# Patient Record
Sex: Female | Born: 1965
Health system: Southern US, Community
[De-identification: ages and names within clinical notes are randomized; demographics above are authoritative.]

## PROBLEM LIST (undated history)

## (undated) DIAGNOSIS — I10 Essential (primary) hypertension: Secondary | ICD-10-CM

## (undated) DIAGNOSIS — F419 Anxiety disorder, unspecified: Secondary | ICD-10-CM

## (undated) DIAGNOSIS — M1990 Unspecified osteoarthritis, unspecified site: Secondary | ICD-10-CM

## (undated) DIAGNOSIS — F329 Major depressive disorder, single episode, unspecified: Secondary | ICD-10-CM

## (undated) DIAGNOSIS — F32A Depression, unspecified: Secondary | ICD-10-CM

## (undated) HISTORY — PX: KNEE ARTHROSCOPY: SUR90

## (undated) HISTORY — PX: ABDOMINAL HYSTERECTOMY: SHX81

---
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
## 2003-03-16 ENCOUNTER — Encounter: Payer: Self-pay | Admitting: Internal Medicine

## 2003-03-16 ENCOUNTER — Ambulatory Visit (HOSPITAL_COMMUNITY): Admission: RE | Admit: 2003-03-16 | Discharge: 2003-03-16 | Payer: Self-pay | Admitting: Internal Medicine

## 2003-03-23 ENCOUNTER — Other Ambulatory Visit: Admission: RE | Admit: 2003-03-23 | Discharge: 2003-03-23 | Payer: Self-pay | Admitting: Obstetrics and Gynecology

## 2003-06-02 ENCOUNTER — Inpatient Hospital Stay (HOSPITAL_COMMUNITY): Admission: RE | Admit: 2003-06-02 | Discharge: 2003-06-03 | Payer: Self-pay | Admitting: Obstetrics and Gynecology

## 2003-08-27 ENCOUNTER — Emergency Department (HOSPITAL_COMMUNITY): Admission: EM | Admit: 2003-08-27 | Discharge: 2003-08-27 | Payer: Self-pay

## 2004-09-26 ENCOUNTER — Ambulatory Visit: Payer: Self-pay | Admitting: Internal Medicine

## 2016-11-28 DIAGNOSIS — F330 Major depressive disorder, recurrent, mild: Secondary | ICD-10-CM | POA: Diagnosis not present

## 2016-12-19 DIAGNOSIS — F330 Major depressive disorder, recurrent, mild: Secondary | ICD-10-CM | POA: Diagnosis not present

## 2017-01-27 DIAGNOSIS — F330 Major depressive disorder, recurrent, mild: Secondary | ICD-10-CM | POA: Diagnosis not present

## 2017-02-13 DIAGNOSIS — F330 Major depressive disorder, recurrent, mild: Secondary | ICD-10-CM | POA: Diagnosis not present

## 2017-03-26 DIAGNOSIS — B9789 Other viral agents as the cause of diseases classified elsewhere: Secondary | ICD-10-CM | POA: Diagnosis not present

## 2017-03-26 DIAGNOSIS — J069 Acute upper respiratory infection, unspecified: Secondary | ICD-10-CM | POA: Diagnosis not present

## 2017-03-26 DIAGNOSIS — J209 Acute bronchitis, unspecified: Secondary | ICD-10-CM | POA: Diagnosis not present

## 2017-03-29 DIAGNOSIS — H6123 Impacted cerumen, bilateral: Secondary | ICD-10-CM | POA: Diagnosis not present

## 2017-03-29 DIAGNOSIS — J988 Other specified respiratory disorders: Secondary | ICD-10-CM | POA: Diagnosis not present

## 2017-03-29 DIAGNOSIS — R062 Wheezing: Secondary | ICD-10-CM | POA: Diagnosis not present

## 2017-04-09 DIAGNOSIS — F330 Major depressive disorder, recurrent, mild: Secondary | ICD-10-CM | POA: Diagnosis not present

## 2017-07-02 DIAGNOSIS — F330 Major depressive disorder, recurrent, mild: Secondary | ICD-10-CM | POA: Diagnosis not present

## 2017-10-21 DIAGNOSIS — F4312 Post-traumatic stress disorder, chronic: Secondary | ICD-10-CM | POA: Diagnosis not present

## 2017-10-21 DIAGNOSIS — F411 Generalized anxiety disorder: Secondary | ICD-10-CM | POA: Diagnosis not present

## 2017-10-21 DIAGNOSIS — F429 Obsessive-compulsive disorder, unspecified: Secondary | ICD-10-CM | POA: Diagnosis not present

## 2017-10-21 DIAGNOSIS — F331 Major depressive disorder, recurrent, moderate: Secondary | ICD-10-CM | POA: Diagnosis not present

## 2017-11-24 DIAGNOSIS — F411 Generalized anxiety disorder: Secondary | ICD-10-CM | POA: Diagnosis not present

## 2017-11-24 DIAGNOSIS — F4312 Post-traumatic stress disorder, chronic: Secondary | ICD-10-CM | POA: Diagnosis not present

## 2017-11-24 DIAGNOSIS — F429 Obsessive-compulsive disorder, unspecified: Secondary | ICD-10-CM | POA: Diagnosis not present

## 2017-11-24 DIAGNOSIS — F330 Major depressive disorder, recurrent, mild: Secondary | ICD-10-CM | POA: Diagnosis not present

## 2017-12-26 DIAGNOSIS — F330 Major depressive disorder, recurrent, mild: Secondary | ICD-10-CM | POA: Diagnosis not present

## 2017-12-26 DIAGNOSIS — F411 Generalized anxiety disorder: Secondary | ICD-10-CM | POA: Diagnosis not present

## 2018-01-13 ENCOUNTER — Encounter: Payer: Self-pay | Admitting: Family Medicine

## 2018-01-13 ENCOUNTER — Ambulatory Visit (INDEPENDENT_AMBULATORY_CARE_PROVIDER_SITE_OTHER): Payer: BLUE CROSS/BLUE SHIELD

## 2018-01-13 ENCOUNTER — Ambulatory Visit (INDEPENDENT_AMBULATORY_CARE_PROVIDER_SITE_OTHER): Payer: BLUE CROSS/BLUE SHIELD | Admitting: Family Medicine

## 2018-01-13 ENCOUNTER — Other Ambulatory Visit: Payer: Self-pay

## 2018-01-13 VITALS — BP 150/90 | HR 68 | Temp 97.3°F | Ht 69.0 in | Wt 255.2 lb

## 2018-01-13 DIAGNOSIS — M25561 Pain in right knee: Secondary | ICD-10-CM | POA: Diagnosis not present

## 2018-01-13 DIAGNOSIS — M25461 Effusion, right knee: Secondary | ICD-10-CM

## 2018-01-13 MED ORDER — HYDROCODONE-ACETAMINOPHEN 5-325 MG PO TABS: 1.0000 | ORAL_TABLET | Freq: Four times a day (QID) | ORAL | 0 refills | Status: DC | PRN

## 2018-01-13 NOTE — Patient Instructions (Addendum)
I am suspicious of a possible meniscus issue or other strain within the knee, but based on your current exam think it is reasonable to try to calm down the inflammation and swelling first with repeat exam.  Blood pressure borderline elevated today, but that is likely due to pain.  Try the hydrocodone 1 pill every 6 hours as needed, ibuprofen 800 mg every 8 hours as needed with food.  Please keep benign your blood pressure, and do not take ibuprofen if that blood pressure is remaining over 140/90.  Please recheck with me in the next week to 10 days and we can decide next step at that time specifically follow-up with orthopedics, MRI, or different brace.  Follow-up sooner if worse  IF you received an x-ray today, you will receive an invoice from [facility]. Please contact [facility] at [phone] with questions or concerns regarding your invoice.   IF you received labwork today, you will receive an invoice from [facility]. Please contact [facility] at [phone] with questions or concerns regarding your invoice.   Our billing staff will not be able to assist you with questions regarding bills from these companies.  You will be contacted with the lab results as soon as they are available. The fastest way to get your results is to activate your My Chart account. Instructions are located on the last page of this paperwork. If you have not heard from us regarding the results in 2 weeks, please contact this office.

## 2018-01-13 NOTE — Progress Notes (Signed)
Subjective:    Patient ID: Julia [name], female    DOB: 08-14-1965, 52 y.o.   MRN: [id number]  HPI Julia Hatchetnnie M Lacorte is a 52 y.o. female Presents today for: Chief Complaint  Patient presents with  . right knee pain    worse these past 4 days. (Pain level 10. cant get comfortable, brings to tears.) hx of knee problems    Here with complaints of R knee pain for awhile - for a few years. Also had scope of R knee 17 years ago in [place]. Saw Dr. Lequita [facility]alt[facility]uisio at [facility] a few years ago. Had an MRI, told had arthritis and minimal cartilage, had some viscosupplementation 2 years ago. No relief. Has swelling in both knees at times. Did some painting this past weekend, and felt a pop/pull in R knee after twisting.   Noticed swelling initially. Pain on both side of the knee at time.  Able to WB but painful, has used braces - otc and one from ortho. No crutches. No relief with brace. More sore with pressure. Occasional burning sensation of knee cap at times. R knee swelling has improved, but still painful.  Trouble getting comfortable. No recent narcotic pain medications, no intolerance or addiction to those meds in the past.     Tx: Ibuprofen 800mg  every 6 hours.    There are no active problems to display for this patient.  History reviewed. No pertinent past medical history. History reviewed. No pertinent surgical history. No Known Allergies Prior to Admission medications   Medication Sig Start Date End Date Taking? Authorizing Provider  ARIPiprazole (ABILIFY) 5 MG tablet TK 1 T PO QD 12/26/17   [provider]  citalopram (CELEXA) 40 MG tablet TK 1 T PO QAM 01/07/18   [provider]  zolpidem (AMBIEN) 10 MG tablet TK 1 T PO QD HS 12/30/17   [provider]   Social History   Socioeconomic History  . Marital status: Married    Spouse name: Not on file  . Number of children: Not on file  . Years of education: Not on file  . Highest education level: Not on file    Occupational History  . Not on file  Social Needs  . Financial resource strain: Not on file  . Food insecurity:    Worry: Not on file    Inability: Not on file  . Transportation needs:    Medical: Not on file    Non-medical: Not on file  Tobacco Use  . Smoking status: Never Smoker  . Smokeless tobacco: Never Used  Substance and Sexual Activity  . Alcohol use: Not Currently    Frequency: Never  . Drug use: Never  . Sexual activity: Yes  Lifestyle  . Physical activity:    Days per week: Not on file    Minutes per session: Not on file  . Stress: Not on file  Relationships  . Social connections:    Talks on phone: Not on file    Gets together: Not on file    Attends religious service: Not on file    Active member of club or organization: Not on file    Attends meetings of clubs or organizations: Not on file    Relationship status: Not on file  . Intimate partner violence:    Fear of current or ex partner: Not on file    Emotionally abused: Not on file    Physically abused: Not on file    Forced sexual  activity: Not on file  Other Topics Concern  . Not on file  Social History Narrative  . Not on file    Review of Systems Right greater than left knee pain as above, other per HPI.    Objective:   Physical Exam  Constitutional: She is oriented to person, place, and time. She appears well-developed and well-nourished. No distress.  HENT:  Head: Normocephalic and atraumatic.  Cardiovascular: Normal rate.  Pulmonary/Chest: Effort normal.  Musculoskeletal:       Right knee: She exhibits decreased range of motion (90 degrees flexion, full extension), effusion and abnormal meniscus (Guarded exam, difficulty testing with McMurray's.  Difficulty with Lachman). She exhibits no ecchymosis, no deformity, no erythema, no LCL laxity and no MCL laxity.  Neurovascular intact distally.  Neurological: She is alert and oriented to person, place, and time.  Psychiatric: She has a  normal mood and affect.   Vitals:   01/13/18 1728 01/13/18 1846  BP: (!) 162/88 (!) 150/90  Pulse: 68   Temp: (!) 97.3 F (36.3 C)   TempSrc: Oral   SpO2: 95%   Weight: 255 lb 3.2 oz (115.8 kg)   Height: 5\' 9"  (1.753 m)    Dg Knee Complete 4 Views Right  Result Date: 01/13/2018 CLINICAL DATA:  Chronic RIGHT knee pain. EXAM: RIGHT KNEE - COMPLETE 4+ VIEW COMPARISON:  None. FINDINGS: No acute fracture, subluxation or dislocation. A knee effusion is present. No other joint abnormalities noted. No focal bony lesions are present. IMPRESSION: Knee effusion without bony abnormality. Electronically Signed   By: Harmon Pier M.D.   On: 01/13/2018 17:54    Assessment & Plan:   ZUNAIRA LAMY is a 52 y.o. female Recurrent pain of right knee - Plan: DG Knee Complete 4 Views Right, HYDROcodone-acetaminophen (NORCO/VICODIN) 5-325 MG tablet  Acute pain of right knee - Plan: HYDROcodone-acetaminophen (NORCO/VICODIN) 5-325 MG tablet  Effusion of right knee Suspected chronic, recurrent knee pain, develop flare of the right knee.  Suspicious for meniscal tear, but guarded exam.  I will  - decided on initial attempt at pain control, relative rest with recheck in the next week to 10 days.  For now, hydrocodone Q6h prn. Potential side effects/risks discussed.   Avoid anti-inflammatories until blood pressure at improved control, then can try otc ibuprofen as in handout. On recheck could consider advanced imaging, trial of injection or referral to ortho. rtc sooner if worse.   Meds ordered this encounter  Medications  . HYDROcodone-acetaminophen (NORCO/VICODIN) 5-325 MG tablet    Sig: Take 1 tablet by mouth every 6 (six) hours as needed for moderate pain.    Dispense:  20 tablet    Refill:  0   Patient Instructions   I am suspicious of a possible meniscus issue or other strain within the knee, but based on your current exam think it is reasonable to try to calm down the inflammation and swelling first  with repeat exam.  Blood pressure borderline elevated today, but that is likely due to pain.  Try the hydrocodone 1 pill every 6 hours as needed, ibuprofen 800 mg every 8 hours as needed with food.  Please keep benign your blood pressure, and do not take ibuprofen if that blood pressure is remaining over 140/90.  Please recheck with me in the next week to 10 days and we can decide next step at that time specifically follow-up with orthopedics, MRI, or different brace.  Follow-up sooner if worse  IF you received an  x-ray today, you will receive an invoice from [facility]. Please contact [facility] at [phone] with questions or concerns regarding your invoice.   IF you received labwork today, you will receive an invoice from [place]. Please contact [facility] at [phone] with questions or concerns regarding your invoice.   Our billing staff will not be able to assist you with questions regarding bills from these companies.  You will be contacted with the lab results as soon as they are available. The fastest way to get your results is to activate your My Chart account. Instructions are located on the last page of this paperwork. If you have not heard from [place] regarding the results in 2 weeks, please contact this office.        Signed,   Meredith Staggers, MD Primary Care at [facility].  01/15/18 7:13 PM

## 2018-01-21 DIAGNOSIS — F330 Major depressive disorder, recurrent, mild: Secondary | ICD-10-CM | POA: Diagnosis not present

## 2018-01-21 DIAGNOSIS — F411 Generalized anxiety disorder: Secondary | ICD-10-CM | POA: Diagnosis not present

## 2018-01-23 DIAGNOSIS — F331 Major depressive disorder, recurrent, moderate: Secondary | ICD-10-CM | POA: Diagnosis not present

## 2018-01-23 DIAGNOSIS — F411 Generalized anxiety disorder: Secondary | ICD-10-CM | POA: Diagnosis not present

## 2018-01-23 DIAGNOSIS — F429 Obsessive-compulsive disorder, unspecified: Secondary | ICD-10-CM | POA: Diagnosis not present

## 2018-01-24 ENCOUNTER — Encounter: Payer: Self-pay | Admitting: Family Medicine

## 2018-01-24 ENCOUNTER — Ambulatory Visit (INDEPENDENT_AMBULATORY_CARE_PROVIDER_SITE_OTHER): Payer: BLUE CROSS/BLUE SHIELD | Admitting: Family Medicine

## 2018-01-24 ENCOUNTER — Other Ambulatory Visit: Payer: Self-pay

## 2018-01-24 DIAGNOSIS — M25561 Pain in right knee: Secondary | ICD-10-CM

## 2018-01-24 MED ORDER — HYDROCODONE-ACETAMINOPHEN 5-325 MG PO TABS: 1.0000 | ORAL_TABLET | Freq: Four times a day (QID) | ORAL | 0 refills | Status: DC | PRN

## 2018-01-24 MED ORDER — MELOXICAM 7.5 MG PO TABS: 7.5000 mg | ORAL_TABLET | Freq: Every day | ORAL | 0 refills | Status: DC

## 2018-01-24 NOTE — Progress Notes (Signed)
Subjective:  By signing my name below, I, Stann Ore, attest that this documentation has been prepared under the direction and in the presence of Meredith Staggers, MD. Electronically Signed: Stann Ore, Scribe. 01/24/2018 , 11:07 AM .  Patient was seen in Room 3 .   Patient ID: Julia [name], female    DOB: Jul 06, 1966, 52 y.o.   MRN: [id number] Chief Complaint  Patient presents with  . right knee pain    10 day follow up. swollen once again   HPI Julia [name] is a 52 y.o. female  Here for right keen pain. Last saw patient on July 2nd, and had reported pain been present for years. She has seen orthopedics in the past including Visco treatment without relief. She did have acute swelling and felt a pop or pull after twisting recently. She has pain with weight bearing and noticed swelling with bilateral knee pain; no relief with brace. She had a guarded exam with diffuse pain; xray indicated effusion without acute bony abnormalities. She was treated with hydrocodone, and held off on anti inflammatories due to elevated BP, but option of ibuprofen at home if improved BP.   Patient states she ran out of hydrocodone on Thursday (2 days ago). Initially, she was taking it every 6 hours, but then was able to taper back. Then, she did start ibuprofen, and wasn't taking hydrocodone daily. She noticed pain starting to return, and has been diffusely tender in her right knee. She's noticed more swelling recently. She's also noticed clicking when walking up stairs. She's had scope done in her left knee in the past. She denies reactions to steroid injections in the past. She denies heart disease or kidney disease. She's had relief with meloxicam in the past. She's been wearing an OTC knee brace with some improvement in walking.   There are no active problems to display for this patient.  No past medical history on file. No past surgical history on file. No Known Allergies Prior to Admission  medications   Medication Sig Start Date End Date Taking? Authorizing Provider  ARIPiprazole (ABILIFY) 5 MG tablet TK 1 T PO QD 12/26/17   [provider]  citalopram (CELEXA) 40 MG tablet TK 1 T PO QAM 01/07/18   [provider]  HYDROcodone-acetaminophen (NORCO/VICODIN) 5-325 MG tablet Take 1 tablet by mouth every 6 (six) hours as needed for moderate pain. 01/13/18   Shade Flood, MD  zolpidem (AMBIEN) 10 MG tablet TK 1 T PO QD HS 12/30/17   [provider]   Social History   Socioeconomic History  . Marital status: Married    Spouse name: Not on file  . Number of children: Not on file  . Years of education: Not on file  . Highest education level: Not on file  Occupational History  . Not on file  Social Needs  . Financial resource strain: Not on file  . Food insecurity:    Worry: Not on file    Inability: Not on file  . Transportation needs:    Medical: Not on file    Non-medical: Not on file  Tobacco Use  . Smoking status: Never Smoker  . Smokeless tobacco: Never Used  Substance and Sexual Activity  . Alcohol use: Not Currently    Frequency: Never  . Drug use: Never  . Sexual activity: Yes  Lifestyle  . Physical activity:    Days per week: Not on file    Minutes per session: Not  on file  . Stress: Not on file  Relationships  . Social connections:    Talks on phone: Not on file    Gets together: Not on file    Attends religious service: Not on file    Active member of club or organization: Not on file    Attends meetings of clubs or organizations: Not on file    Relationship status: Not on file  . Intimate partner violence:    Fear of current or ex partner: Not on file    Emotionally abused: Not on file    Physically abused: Not on file    Forced sexual activity: Not on file  Other Topics Concern  . Not on file  Social History Narrative  . Not on file   Review of Systems  Constitutional: Negative for chills, fatigue, fever and  unexpected weight change.  Respiratory: Negative for cough.   Gastrointestinal: Negative for constipation, diarrhea, nausea and vomiting.  Musculoskeletal: Positive for arthralgias (right knee) and joint swelling.  Skin: Negative for rash and wound.  Neurological: Negative for dizziness, weakness and headaches.       Objective:   Physical Exam  Constitutional: She is oriented to person, place, and time. She appears well-developed and well-nourished. No distress.  HENT:  Head: Normocephalic and atraumatic.  Eyes: Pupils are equal, round, and reactive to light. EOM are normal.  Neck: Neck supple.  Cardiovascular: Normal rate.  Pulmonary/Chest: Effort normal. No respiratory distress.  Musculoskeletal: Normal range of motion.  Right knee: full extension, flexion to about 80 degrees, there is some effusion present; lateral joint line is tender, patella and patella tendon non tender, negative valgus, negative varus, pain with mcmurray on external rotation, no palpable click, negative lachman  Neurological: She is alert and oriented to person, place, and time.  Skin: Skin is warm and dry.  Psychiatric: She has a normal mood and affect. Her behavior is normal.  Nursing note and vitals reviewed.   Vitals:   01/24/18 1021  BP: 140/86  Pulse: 71  Temp: 97.7 F (36.5 C)  TempSrc: Oral  SpO2: 96%  Weight: 261 lb (118.4 kg)  Height: 5\' 9"  (1.753 m)       Assessment & Plan:    Julia [name] is a 52 y.o. female Recurrent pain of right knee - Plan: HYDROcodone-acetaminophen (NORCO/VICODIN) 5-325 MG tablet  Acute pain of right knee - Plan: HYDROcodone-acetaminophen (NORCO/VICODIN) 5-325 MG tablet, meloxicam (MOBIC) 7.5 MG tablet  Acute on chronic right knee pain, some improvement since last visit, still would consider probable degenerative meniscal tear based on symptoms and exam.  Offered steroid injection, but she would like to try anti-inflammatory temporarily first.  -Meloxicam  7.5 mg daily as needed, potential side effects, long-term risk discussed.  Hydrocodone refilled for as needed use only but discussed short-term prescription.  Recheck next few weeks and likely corticosteroid injection at that time if she has not had significant improvement.  RTC precautions sooner if worse  Meds ordered this encounter  Medications  . HYDROcodone-acetaminophen (NORCO/VICODIN) 5-325 MG tablet    Sig: Take 1 tablet by mouth every 6 (six) hours as needed for moderate pain.    Dispense:  20 tablet    Refill:  0  . meloxicam (MOBIC) 7.5 MG tablet    Sig: Take 1 tablet (7.5 mg total) by mouth daily.    Dispense:  30 tablet    Refill:  0   Patient Instructions   Meloxicam 1 pill once  per day for inflammation and pain.  Do not combine this with other NSAIDs.  If any stomach upset, blood in stool or dark tarry stools, stop that medication immediately.  Hydrocodone only if needed for more severe breakthrough pain.  Follow-up in 2 weeks to decide if injection needed at that time.  Sooner if needed.  Thank you for coming in today.  Knee Pain, Adult Knee pain in adults is common. It can be caused by many things, including:  Arthritis.  A fluid-filled sac (cyst) or growth in your knee.  An infection in your knee.  An injury that will not heal.  Damage, swelling, or irritation of the tissues that support your knee.  Knee pain is usually not a sign of a serious problem. The pain may go away on its own with time and rest. If it does not, a health care provider may order tests to find the cause of the pain. These may include:  Imaging tests, such as an X-ray, MRI, or ultrasound.  Joint aspiration. In this test, fluid is removed from the knee.  Arthroscopy. In this test, a lighted tube is inserted into knee and an image is projected onto a TV screen.  A biopsy. In this test, a sample of tissue is removed from the body and studied under a microscope.  Follow these instructions at  home: Pay attention to any changes in your symptoms. Take these actions to relieve your pain. Activity  Rest your knee.  Do not do things that cause pain or make pain worse.  Avoid high-impact activities or exercises, such as running, jumping rope, or doing jumping jacks. General instructions  Take over-the-counter and prescription medicines only as told by your health care provider.  Raise (elevate) your knee above the level of your heart when you are sitting or lying down.  Sleep with a pillow under your knee.  If directed, apply ice to the knee: ? Put ice in a plastic bag. ? Place a towel between your skin and the bag. ? Leave the ice on for 20 minutes, 2-3 times a day.  Ask your health care provider if you should wear an elastic knee support.  Lose weight if you are overweight. Extra weight can put pressure on your knee.  Do not use any products that contain nicotine or tobacco, such as cigarettes and e-cigarettes. Smoking may slow the healing of any bone and joint problems that you may have. If you need help quitting, ask your health care provider. Contact a health care provider if:  Your knee pain continues, changes, or gets worse.  You have a fever along with knee pain.  Your knee buckles or locks up.  Your knee swells, and the swelling becomes worse. Get help right away if:  Your knee feels warm to the touch.  You cannot move your knee.  You have severe pain in your knee.  You have chest pain.  You have trouble breathing. Summary  Knee pain in adults is common. It can be caused by many things, including, arthritis, infection, cysts, or injury.  Knee pain is usually not a sign of a serious problem, but if it does not go away, a health care provider may perform tests to know the cause of the pain.  Pay attention to any changes in your symptoms. Relieve your pain with rest, medicines, light activity, and use of ice.  Get help if your pain continues or  becomes very severe, or if your knee buckles or  locks up, or if you have chest pain or trouble breathing. This information is not intended to replace advice given to you by your health care provider. Make sure you discuss any questions you have with your health care provider. Document Released: 04/28/2007 Document Revised: 06/21/2016 Document Reviewed: 06/21/2016 Elsevier Interactive Patient Education  2018 [employer].    IF you received an x-ray today, you will receive an invoice from [facility]. Please contact [facility] at [phone] with questions or concerns regarding your invoice.   IF you received labwork today, you will receive an invoice from [place]. Please contact [facility] at [phone] with questions or concerns regarding your invoice.   Our billing staff will not be able to assist you with questions regarding bills from these companies.  You will be contacted with the lab results as soon as they are available. The fastest way to get your results is to activate your My Chart account. Instructions are located on the last page of this paperwork. If you have not heard from [place] regarding the results in 2 weeks, please contact this office.       I personally performed the services described in this documentation, which was scribed in my presence. The recorded information has been reviewed and considered for accuracy and completeness, addended by me as needed, and agree with information above.  Signed,   Meredith Staggers, MD Primary Care at [facility].  01/25/18 10:53 AM

## 2018-01-24 NOTE — Patient Instructions (Addendum)
Meloxicam 1 pill once per day for inflammation and pain.  Do not combine this with other NSAIDs.  If any stomach upset, blood in stool or dark tarry stools, stop that medication immediately.  Hydrocodone only if needed for more severe breakthrough pain.  Follow-up in 2 weeks to decide if injection needed at that time.  Sooner if needed.  Thank you for coming in today.  Knee Pain, Adult Knee pain in adults is common. It can be caused by many things, including:  Arthritis.  A fluid-filled sac (cyst) or growth in your knee.  An infection in your knee.  An injury that will not heal.  Damage, swelling, or irritation of the tissues that support your knee.  Knee pain is usually not a sign of a serious problem. The pain may go away on its own with time and rest. If it does not, a health care provider may order tests to find the cause of the pain. These may include:  Imaging tests, such as an X-ray, MRI, or ultrasound.  Joint aspiration. In this test, fluid is removed from the knee.  Arthroscopy. In this test, a lighted tube is inserted into knee and an image is projected onto a TV screen.  A biopsy. In this test, a sample of tissue is removed from the body and studied under a microscope.  Follow these instructions at home: Pay attention to any changes in your symptoms. Take these actions to relieve your pain. Activity  Rest your knee.  Do not do things that cause pain or make pain worse.  Avoid high-impact activities or exercises, such as running, jumping rope, or doing jumping jacks. General instructions  Take over-the-counter and prescription medicines only as told by your health care provider.  Raise (elevate) your knee above the level of your heart when you are sitting or lying down.  Sleep with a pillow under your knee.  If directed, apply ice to the knee: ? Put ice in a plastic bag. ? Place a towel between your skin and the bag. ? Leave the ice on for 20 minutes, 2-3 times  a day.  Ask your health care provider if you should wear an elastic knee support.  Lose weight if you are overweight. Extra weight can put pressure on your knee.  Do not use any products that contain nicotine or tobacco, such as cigarettes and e-cigarettes. Smoking may slow the healing of any bone and joint problems that you may have. If you need help quitting, ask your health care provider. Contact a health care provider if:  Your knee pain continues, changes, or gets worse.  You have a fever along with knee pain.  Your knee buckles or locks up.  Your knee swells, and the swelling becomes worse. Get help right away if:  Your knee feels warm to the touch.  You cannot move your knee.  You have severe pain in your knee.  You have chest pain.  You have trouble breathing. Summary  Knee pain in adults is common. It can be caused by many things, including, arthritis, infection, cysts, or injury.  Knee pain is usually not a sign of a serious problem, but if it does not go away, a health care provider may perform tests to know the cause of the pain.  Pay attention to any changes in your symptoms. Relieve your pain with rest, medicines, light activity, and use of ice.  Get help if your pain continues or becomes very severe, or if your  knee buckles or locks up, or if you have chest pain or trouble breathing. This information is not intended to replace advice given to you by your health care provider. Make sure you discuss any questions you have with your health care provider. Document Released: 04/28/2007 Document Revised: 06/21/2016 Document Reviewed: 06/21/2016 Elsevier Interactive Patient Education  2018 [employer]    IF you received an x-ray today, you will receive an invoice from [facility]. Please contact [facility] at [phone] with questions or concerns regarding your invoice.   IF you received labwork today, you will receive an invoice from  [facility]. Please contact [facility] at [phone] with questions or concerns regarding your invoice.   Our billing staff will not be able to assist you with questions regarding bills from these companies.  You will be contacted with the lab results as soon as they are available. The fastest way to get your results is to activate your My Chart account. Instructions are located on the last page of this paperwork. If you have not heard from us regarding the results in 2 weeks, please contact this office.

## 2018-02-06 ENCOUNTER — Ambulatory Visit (INDEPENDENT_AMBULATORY_CARE_PROVIDER_SITE_OTHER): Payer: BLUE CROSS/BLUE SHIELD | Admitting: Family Medicine

## 2018-02-06 ENCOUNTER — Encounter: Payer: Self-pay | Admitting: Family Medicine

## 2018-02-06 ENCOUNTER — Other Ambulatory Visit: Payer: Self-pay

## 2018-02-06 DIAGNOSIS — M25561 Pain in right knee: Secondary | ICD-10-CM

## 2018-02-06 MED ORDER — HYDROCODONE-ACETAMINOPHEN 5-325 MG PO TABS: 1.0000 | ORAL_TABLET | Freq: Four times a day (QID) | ORAL | 0 refills | Status: DC | PRN

## 2018-02-06 NOTE — Progress Notes (Signed)
Subjective:  By signing my name below, I, Stann Ore, attest that this documentation has been prepared under the direction and in the presence of Meredith Staggers, MD. Electronically Signed: Stann Ore, Scribe. 02/06/2018 , 4:38 PM .  Patient was seen in Room 2 .   Patient ID: Julia [name], female    DOB: August 19, 1965, 52 y.o.   MRN: [id number] Chief Complaint  Patient presents with  . right knee swollen   HPI Julia [name] is a 52 y.o. female Here for follow up of right knee pain. Patient was initially see on July 2nd for long standing knee pain but had an acute pop/pull recently. She did have swelling with bilateral knee pain but primarily in the right knee. She was initially treated with hydrocodone with some improvement at last visit. Discussed possible injection but wanted to try mobic first. Hydrocodone was refilled for PRN use only. Initial imaging for right knee on July 2nd, showed effusion without bony abnormality. Here for recheck.   Patient states the mobic was improving initially where her walking had improved. She was able to walk better, though, she didn't increase her activity. But then about 2 days ago, her right knee started to swell up with some pain and tightness again. She denies any popping or locking. She has a history of visco injection in the past by another provider.   Over 25 minutes with exam and 2 attempts of right knee aspiration.   There are no active problems to display for this patient.  No past medical history on file. No past surgical history on file. No Known Allergies Prior to Admission medications   Medication Sig Start Date End Date Taking? Authorizing Provider  ARIPiprazole (ABILIFY) 5 MG tablet TK 1 T PO QD 12/26/17   [provider]  citalopram (CELEXA) 40 MG tablet TK 1 T PO QAM 01/07/18   [provider]  HYDROcodone-acetaminophen (NORCO/VICODIN) 5-325 MG tablet Take 1 tablet by mouth every 6 (six) hours as needed for  moderate pain. 01/24/18   Shade Flood, MD  meloxicam (MOBIC) 7.5 MG tablet Take 1 tablet (7.5 mg total) by mouth daily. 01/24/18   Shade Flood, MD  zolpidem (AMBIEN) 10 MG tablet TK 1 T PO QD HS 12/30/17   [provider]   Social History   Socioeconomic History  . Marital status: Married    Spouse name: Not on file  . Number of children: Not on file  . Years of education: Not on file  . Highest education level: Not on file  Occupational History  . Not on file  Social Needs  . Financial resource strain: Not on file  . Food insecurity:    Worry: Not on file    Inability: Not on file  . Transportation needs:    Medical: Not on file    Non-medical: Not on file  Tobacco Use  . Smoking status: Never Smoker  . Smokeless tobacco: Never Used  Substance and Sexual Activity  . Alcohol use: Not Currently    Frequency: Never  . Drug use: Never  . Sexual activity: Yes  Lifestyle  . Physical activity:    Days per week: Not on file    Minutes per session: Not on file  . Stress: Not on file  Relationships  . Social connections:    Talks on phone: Not on file    Gets together: Not on file    Attends religious service: Not on file  Active member of club or organization: Not on file    Attends meetings of clubs or organizations: Not on file    Relationship status: Not on file  . Intimate partner violence:    Fear of current or ex partner: Not on file    Emotionally abused: Not on file    Physically abused: Not on file    Forced sexual activity: Not on file  Other Topics Concern  . Not on file  Social History Narrative  . Not on file   Review of Systems  Constitutional: Negative for chills, fatigue, fever and unexpected weight change.  Respiratory: Negative for cough.   Gastrointestinal: Negative for constipation, diarrhea, nausea and vomiting.  Musculoskeletal: Positive for arthralgias (right knee) and joint swelling (right knee).  Skin: Negative for rash  and wound.  Neurological: Negative for dizziness, weakness and headaches.       Objective:   Physical Exam  Constitutional: She is oriented to person, place, and time. She appears well-developed and well-nourished. No distress.  HENT:  Head: Normocephalic and atraumatic.  Eyes: Pupils are equal, round, and reactive to light. EOM are normal.  Neck: Neck supple.  Cardiovascular: Normal rate.  Pulmonary/Chest: Effort normal. No respiratory distress.  Musculoskeletal: Normal range of motion.  Right knee: limited exam, some fullness about the right knee, lateral joint line is tender, patella and patella tendon non tender, flexion to approximately 80 degrees, full extension, no extensor lag, pain laterally with mcmurray on external rotation  Neurological: She is alert and oriented to person, place, and time.  Skin: Skin is warm and dry.  Psychiatric: She has a normal mood and affect. Her behavior is normal.  Nursing note and vitals reviewed.   Vitals:   02/06/18 1611  BP: 138/86  Pulse: 68  Temp: 97.9 F (36.6 C)  TempSrc: Oral  SpO2: 95%  Weight: 258 lb 3.2 oz (117.1 kg)  Height: 5\' 9"  (1.753 m)  Risks (including but not limited to bleeding and infection, damage to underlying tissues), benefits, and alternatives discussed for R knee aspiration/injection..  Verbal consent obtained after any questions were answered. initial approach superolateral, landmarks noted (superior and posterior patella) and marked.  Area cleansed with Betadine x3, ethyl chloride spray for topical anesthesia, followed by alcohol swab. 20ga 1 and1/2 inch needle. Unable to aspirate fluid superolateral.  Second attempt inferolateral with new needle, syringe after consent obtained again as above. Unfortunately unable to obtain aspirate with this approach and attempted needle redirection. Needle withdrawn, No complications. Min bleeding (less than 0.5cc total). Bandage applied.  RTC precautions discussed in regards to  injection sites.      Assessment & Plan:    Julia [name] is a 52 y.o. female Recurrent pain of right knee - Plan: HYDROcodone-acetaminophen (NORCO/VICODIN) 5-325 MG tablet, Ambulatory referral to Orthopedic Surgery, CANCELED: Ambulatory referral to Orthopedic Surgery  Acute pain of right knee - Plan: HYDROcodone-acetaminophen (NORCO/VICODIN) 5-325 MG tablet, Ambulatory referral to Orthopedic Surgery, CANCELED: Ambulatory referral to Orthopedic Surgery  Attempted aspiration/injection as above with 2 approaches, but unsuccessful. Will refer to ortho for further eval and potential repeat attempts at injection at their office. continue mobic, hydrocodone only if needed for more significant pain. rtc precautions if acute worsening.    Meds ordered this encounter  Medications  . HYDROcodone-acetaminophen (NORCO/VICODIN) 5-325 MG tablet    Sig: Take 1 tablet by mouth every 6 (six) hours as needed for moderate pain.    Dispense:  20 tablet  Refill:  0   Patient Instructions    Unfortunately I was not able to withdraw any fluid from your knee, and therefore did not inject any medication in the knee today.  Continue meloxicam once per day as needed, hydrocodone as needed, but I will refer you to the orthopedic practice, and will request Dr. Althea Charon or Dr. Jerl Santos.   Return to the clinic or go to the nearest emergency room if any of your symptoms worsen or new symptoms occur.  Knee Pain, Adult Knee pain in adults is common. It can be caused by many things, including:  Arthritis.  A fluid-filled sac (cyst) or growth in your knee.  An infection in your knee.  An injury that will not heal.  Damage, swelling, or irritation of the tissues that support your knee.  Knee pain is usually not a sign of a serious problem. The pain may go away on its own with time and rest. If it does not, a health care provider may order tests to find the cause of the pain. These may include:  Imaging  tests, such as an X-ray, MRI, or ultrasound.  Joint aspiration. In this test, fluid is removed from the knee.  Arthroscopy. In this test, a lighted tube is inserted into knee and an image is projected onto a TV screen.  A biopsy. In this test, a sample of tissue is removed from the body and studied under a microscope.  Follow these instructions at home: Pay attention to any changes in your symptoms. Take these actions to relieve your pain. Activity  Rest your knee.  Do not do things that cause pain or make pain worse.  Avoid high-impact activities or exercises, such as running, jumping rope, or doing jumping jacks. General instructions  Take over-the-counter and prescription medicines only as told by your health care provider.  Raise (elevate) your knee above the level of your heart when you are sitting or lying down.  Sleep with a pillow under your knee.  If directed, apply ice to the knee: ? Put ice in a plastic bag. ? Place a towel between your skin and the bag. ? Leave the ice on for 20 minutes, 2-3 times a day.  Ask your health care provider if you should wear an elastic knee support.  Lose weight if you are overweight. Extra weight can put pressure on your knee.  Do not use any products that contain nicotine or tobacco, such as cigarettes and e-cigarettes. Smoking may slow the healing of any bone and joint problems that you may have. If you need help quitting, ask your health care provider. Contact a health care provider if:  Your knee pain continues, changes, or gets worse.  You have a fever along with knee pain.  Your knee buckles or locks up.  Your knee swells, and the swelling becomes worse. Get help right away if:  Your knee feels warm to the touch.  You cannot move your knee.  You have severe pain in your knee.  You have chest pain.  You have trouble breathing. Summary  Knee pain in adults is common. It can be caused by many things, including,  arthritis, infection, cysts, or injury.  Knee pain is usually not a sign of a serious problem, but if it does not go away, a health care provider may perform tests to know the cause of the pain.  Pay attention to any changes in your symptoms. Relieve your pain with rest, medicines, light activity, and  use of ice.  Get help if your pain continues or becomes very severe, or if your knee buckles or locks up, or if you have chest pain or trouble breathing. This information is not intended to replace advice given to you by your health care provider. Make sure you discuss any questions you have with your health care provider. Document Released: 04/28/2007 Document Revised: 06/21/2016 Document Reviewed: 06/21/2016 Elsevier Interactive Patient Education  2018 [employer].     IF you received an x-ray today, you will receive an invoice from [facility]. Please contact [facility] at [phone] with questions or concerns regarding your invoice.   IF you received labwork today, you will receive an invoice from [facility]. Please contact [facility] at [phone] with questions or concerns regarding your invoice.   Our billing staff will not be able to assist you with questions regarding bills from these companies.  You will be contacted with the lab results as soon as they are available. The fastest way to get your results is to activate your My Chart account. Instructions are located on the last page of this paperwork. If you have not heard from [place] regarding the results in 2 weeks, please contact this office.       I personally performed the services described in this documentation, which was scribed in my presence. The recorded information has been reviewed and considered for accuracy and completeness, addended by me as needed, and agree with information above.  Signed,   Meredith Staggers, MD [facility] at [facility].  02/08/18 3:19 PM

## 2018-02-06 NOTE — Patient Instructions (Addendum)
Unfortunately I was not able to withdraw any fluid from your knee, and therefore did not inject any medication in the knee today.  Continue meloxicam once per day as needed, hydrocodone as needed, but I will refer you to the orthopedic practice, and will request Dr. Althea CharonMcKinley or Dr. Jerl Santosalldorf.   Return to the clinic or go to the nearest emergency room if any of your symptoms worsen or new symptoms occur.  Knee Pain, Adult Knee pain in adults is common. It can be caused by many things, including:  Arthritis.  A fluid-filled sac (cyst) or growth in your knee.  An infection in your knee.  An injury that will not heal.  Damage, swelling, or irritation of the tissues that support your knee.  Knee pain is usually not a sign of a serious problem. The pain may go away on its own with time and rest. If it does not, a health care provider may order tests to find the cause of the pain. These may include:  Imaging tests, such as an X-ray, MRI, or ultrasound.  Joint aspiration. In this test, fluid is removed from the knee.  Arthroscopy. In this test, a lighted tube is inserted into knee and an image is projected onto a TV screen.  A biopsy. In this test, a sample of tissue is removed from the body and studied under a microscope.  Follow these instructions at home: Pay attention to any changes in your symptoms. Take these actions to relieve your pain. Activity  Rest your knee.  Do not do things that cause pain or make pain worse.  Avoid high-impact activities or exercises, such as running, jumping rope, or doing jumping jacks. General instructions  Take over-the-counter and prescription medicines only as told by your health care provider.  Raise (elevate) your knee above the level of your heart when you are sitting or lying down.  Sleep with a pillow under your knee.  If directed, apply ice to the knee: ? Put ice in a plastic bag. ? Place a towel between your skin and the  bag. ? Leave the ice on for 20 minutes, 2-3 times a day.  Ask your health care provider if you should wear an elastic knee support.  Lose weight if you are overweight. Extra weight can put pressure on your knee.  Do not use any products that contain nicotine or tobacco, such as cigarettes and e-cigarettes. Smoking may slow the healing of any bone and joint problems that you may have. If you need help quitting, ask your health care provider. Contact a health care provider if:  Your knee pain continues, changes, or gets worse.  You have a fever along with knee pain.  Your knee buckles or locks up.  Your knee swells, and the swelling becomes worse. Get help right away if:  Your knee feels warm to the touch.  You cannot move your knee.  You have severe pain in your knee.  You have chest pain.  You have trouble breathing. Summary  Knee pain in adults is common. It can be caused by many things, including, arthritis, infection, cysts, or injury.  Knee pain is usually not a sign of a serious problem, but if it does not go away, a health care provider may perform tests to know the cause of the pain.  Pay attention to any changes in your symptoms. Relieve your pain with rest, medicines, light activity, and use of ice.  Get help if your pain continues or  becomes very severe, or if your knee buckles or locks up, or if you have chest pain or trouble breathing. This information is not intended to replace advice given to you by your health care provider. Make sure you discuss any questions you have with your health care provider. Document Released: 04/28/2007 Document Revised: 06/21/2016 Document Reviewed: 06/21/2016 Elsevier Interactive Patient Education  2018 [employer].     IF you received an x-ray today, you will receive an invoice from [facility]. Please contact [facility] at [phone] with questions or concerns regarding your invoice.   IF you received  labwork today, you will receive an invoice from [facility]. Please contact [facility] at [phone] with questions or concerns regarding your invoice.   Our billing staff will not be able to assist you with questions regarding bills from these companies.  You will be contacted with the lab results as soon as they are available. The fastest way to get your results is to activate your My Chart account. Instructions are located on the last page of this paperwork. If you have not heard from [place] regarding the results in 2 weeks, please contact this office.

## 2018-02-08 ENCOUNTER — Encounter: Payer: Self-pay | Admitting: Family Medicine

## 2018-02-09 DIAGNOSIS — F411 Generalized anxiety disorder: Secondary | ICD-10-CM | POA: Diagnosis not present

## 2018-02-09 DIAGNOSIS — F429 Obsessive-compulsive disorder, unspecified: Secondary | ICD-10-CM | POA: Diagnosis not present

## 2018-02-09 DIAGNOSIS — F331 Major depressive disorder, recurrent, moderate: Secondary | ICD-10-CM | POA: Diagnosis not present

## 2018-02-16 DIAGNOSIS — M1712 Unilateral primary osteoarthritis, left knee: Secondary | ICD-10-CM | POA: Diagnosis not present

## 2018-02-16 DIAGNOSIS — M25561 Pain in right knee: Secondary | ICD-10-CM | POA: Diagnosis not present

## 2018-02-16 DIAGNOSIS — M25562 Pain in left knee: Secondary | ICD-10-CM | POA: Diagnosis not present

## 2018-02-16 DIAGNOSIS — M1711 Unilateral primary osteoarthritis, right knee: Secondary | ICD-10-CM | POA: Diagnosis not present

## 2018-02-17 ENCOUNTER — Other Ambulatory Visit: Payer: Self-pay | Admitting: Family Medicine

## 2018-02-17 DIAGNOSIS — M25561 Pain in right knee: Secondary | ICD-10-CM

## 2018-02-17 NOTE — Telephone Encounter (Signed)
Okay to refill? 

## 2018-02-17 NOTE — Telephone Encounter (Signed)
Meloxicam (Mobic) 7.5mg  refill Last Refill:01/24/18 #30 Last OV: 02/06/18 PCP: Dr. Neva SeatGreene

## 2018-02-18 NOTE — Telephone Encounter (Signed)
Refilled, but should be following up with ortho.

## 2018-02-25 DIAGNOSIS — F331 Major depressive disorder, recurrent, moderate: Secondary | ICD-10-CM | POA: Diagnosis not present

## 2018-02-25 DIAGNOSIS — F429 Obsessive-compulsive disorder, unspecified: Secondary | ICD-10-CM | POA: Diagnosis not present

## 2018-02-25 DIAGNOSIS — F411 Generalized anxiety disorder: Secondary | ICD-10-CM | POA: Diagnosis not present

## 2018-02-26 DIAGNOSIS — R030 Elevated blood-pressure reading, without diagnosis of hypertension: Secondary | ICD-10-CM | POA: Diagnosis not present

## 2018-02-26 DIAGNOSIS — H109 Unspecified conjunctivitis: Secondary | ICD-10-CM | POA: Diagnosis not present

## 2018-03-13 DIAGNOSIS — F429 Obsessive-compulsive disorder, unspecified: Secondary | ICD-10-CM | POA: Diagnosis not present

## 2018-03-13 DIAGNOSIS — F411 Generalized anxiety disorder: Secondary | ICD-10-CM | POA: Diagnosis not present

## 2018-03-13 DIAGNOSIS — F330 Major depressive disorder, recurrent, mild: Secondary | ICD-10-CM | POA: Diagnosis not present

## 2018-03-18 ENCOUNTER — Other Ambulatory Visit: Payer: Self-pay | Admitting: Family Medicine

## 2018-03-18 DIAGNOSIS — M25561 Pain in right knee: Secondary | ICD-10-CM

## 2018-03-18 NOTE — Telephone Encounter (Signed)
Plan for short course, including due to to risks with combination with Celexa.  It did appear she was seen by orthopedics, with possible physical therapy, did they recommend other treatment?

## 2018-03-18 NOTE — Telephone Encounter (Signed)
Mobic 7.5 mg refill Last Refill:02/18/18 # 30 Last OV: 02/06/18 PCP: Neva Seat Pharmacy: [facility]/ [address]

## 2018-03-18 NOTE — Telephone Encounter (Signed)
Refill? Knee pain

## 2018-03-19 NOTE — Telephone Encounter (Signed)
Pt returned call. States she has not had any further treatment; has been routinely doing leg exercises at home.

## 2018-03-19 NOTE — Telephone Encounter (Signed)
Left VM for pt to CB in response to Dr. Haze Justin  question re: treatment

## 2018-03-19 NOTE — Telephone Encounter (Signed)
I have spoken to the pt and she stated that Dr. Althea Charon is not a good fit for her. She stated that he hurt her feeling with telling her she needs to loose weight. She stated that she knows she needs to but would be okay with another recommendation.   She stated that she is doing what she was told to with the home exercises and was not given information on how to manage with the pain.   I have informed her on our concern with continuing the Meloxicam long term and she stated that she is now off the celexa and now trying the Prozac. She is asking for additional refills for it his helping with the pain.   Please advise on the refills.   Thanks, [employer]

## 2018-03-19 NOTE — Telephone Encounter (Signed)
I have attempted to call pt and left a VM to call back.   Please advise on refill

## 2018-03-20 NOTE — Telephone Encounter (Signed)
I have temporarily refilled the meloxicam, use only if needed.  I also placed another order for second opinion with different orthopedist.

## 2018-03-27 DIAGNOSIS — F429 Obsessive-compulsive disorder, unspecified: Secondary | ICD-10-CM | POA: Diagnosis not present

## 2018-03-27 DIAGNOSIS — F330 Major depressive disorder, recurrent, mild: Secondary | ICD-10-CM | POA: Diagnosis not present

## 2018-03-27 DIAGNOSIS — F411 Generalized anxiety disorder: Secondary | ICD-10-CM | POA: Diagnosis not present

## 2018-04-16 DIAGNOSIS — F411 Generalized anxiety disorder: Secondary | ICD-10-CM | POA: Diagnosis not present

## 2018-04-16 DIAGNOSIS — F429 Obsessive-compulsive disorder, unspecified: Secondary | ICD-10-CM | POA: Diagnosis not present

## 2018-04-16 DIAGNOSIS — F330 Major depressive disorder, recurrent, mild: Secondary | ICD-10-CM | POA: Diagnosis not present

## 2018-04-27 ENCOUNTER — Ambulatory Visit (INDEPENDENT_AMBULATORY_CARE_PROVIDER_SITE_OTHER): Payer: BLUE CROSS/BLUE SHIELD | Admitting: Orthopaedic Surgery

## 2018-04-28 ENCOUNTER — Other Ambulatory Visit: Payer: Self-pay | Admitting: Family Medicine

## 2018-04-28 DIAGNOSIS — M25561 Pain in right knee: Secondary | ICD-10-CM

## 2018-04-29 NOTE — Telephone Encounter (Signed)
Requested medication (s) are due for refill today: yes  Requested medication (s) are on the active medication list: yes  Last refill:  03/20/18  Future visit scheduled: upcoming appointment with orthopedics 05/25/18  Notes to clinic:  No HGB in past 360 days     Requested Prescriptions  Pending Prescriptions Disp Refills   meloxicam (MOBIC) 7.5 MG tablet [Pharmacy Med Name: MELOXICAM 7.5MG  TABLETS] 30 tablet 0    Sig: TAKE 1 TABLET(7.5 MG) BY MOUTH DAILY     Analgesics:  COX2 Inhibitors Failed - 04/28/2018  8:59 PM      Failed - HGB in normal range and within 360 days    No results found for: HGB, HGBKUC, HGBPOCKUC       Failed - Cr in normal range and within 360 days    No results found for: CREATININE       Passed - Patient is not pregnant      Passed - Valid encounter within last 12 months    Recent Outpatient Visits          2 months ago Recurrent pain of right knee   [facility], Asencion Partridge, MD   3 months ago Recurrent pain of right knee   [facility], Asencion Partridge, MD   3 months ago Recurrent pain of right knee   [facility] at [facility], Asencion Partridge, MD

## 2018-04-30 NOTE — Telephone Encounter (Signed)
Refilled. keep appointment with ortho to discuss further refills.

## 2018-05-04 DIAGNOSIS — F429 Obsessive-compulsive disorder, unspecified: Secondary | ICD-10-CM | POA: Diagnosis not present

## 2018-05-04 DIAGNOSIS — F411 Generalized anxiety disorder: Secondary | ICD-10-CM | POA: Diagnosis not present

## 2018-05-04 DIAGNOSIS — F330 Major depressive disorder, recurrent, mild: Secondary | ICD-10-CM | POA: Diagnosis not present

## 2018-05-25 ENCOUNTER — Ambulatory Visit (INDEPENDENT_AMBULATORY_CARE_PROVIDER_SITE_OTHER): Payer: BLUE CROSS/BLUE SHIELD | Admitting: Orthopaedic Surgery

## 2018-05-25 DIAGNOSIS — F411 Generalized anxiety disorder: Secondary | ICD-10-CM | POA: Diagnosis not present

## 2018-05-25 DIAGNOSIS — F429 Obsessive-compulsive disorder, unspecified: Secondary | ICD-10-CM | POA: Diagnosis not present

## 2018-05-25 DIAGNOSIS — F331 Major depressive disorder, recurrent, moderate: Secondary | ICD-10-CM | POA: Diagnosis not present

## 2018-05-30 DIAGNOSIS — F331 Major depressive disorder, recurrent, moderate: Secondary | ICD-10-CM | POA: Diagnosis not present

## 2018-05-30 DIAGNOSIS — F411 Generalized anxiety disorder: Secondary | ICD-10-CM | POA: Diagnosis not present

## 2018-05-30 DIAGNOSIS — F429 Obsessive-compulsive disorder, unspecified: Secondary | ICD-10-CM | POA: Diagnosis not present

## 2018-06-01 ENCOUNTER — Ambulatory Visit (INDEPENDENT_AMBULATORY_CARE_PROVIDER_SITE_OTHER): Payer: BLUE CROSS/BLUE SHIELD | Admitting: Orthopaedic Surgery

## 2018-06-05 DIAGNOSIS — F429 Obsessive-compulsive disorder, unspecified: Secondary | ICD-10-CM | POA: Diagnosis not present

## 2018-06-05 DIAGNOSIS — F411 Generalized anxiety disorder: Secondary | ICD-10-CM | POA: Diagnosis not present

## 2018-06-05 DIAGNOSIS — F330 Major depressive disorder, recurrent, mild: Secondary | ICD-10-CM | POA: Diagnosis not present

## 2018-06-08 ENCOUNTER — Other Ambulatory Visit: Payer: Self-pay | Admitting: Family Medicine

## 2018-06-08 DIAGNOSIS — F330 Major depressive disorder, recurrent, mild: Secondary | ICD-10-CM | POA: Diagnosis not present

## 2018-06-08 DIAGNOSIS — F429 Obsessive-compulsive disorder, unspecified: Secondary | ICD-10-CM | POA: Diagnosis not present

## 2018-06-08 DIAGNOSIS — M25561 Pain in right knee: Secondary | ICD-10-CM

## 2018-06-08 DIAGNOSIS — F411 Generalized anxiety disorder: Secondary | ICD-10-CM | POA: Diagnosis not present

## 2018-06-09 ENCOUNTER — Other Ambulatory Visit: Payer: Self-pay | Admitting: Family Medicine

## 2018-06-09 DIAGNOSIS — M25561 Pain in right knee: Secondary | ICD-10-CM

## 2018-07-10 DIAGNOSIS — F429 Obsessive-compulsive disorder, unspecified: Secondary | ICD-10-CM | POA: Diagnosis not present

## 2018-07-10 DIAGNOSIS — F411 Generalized anxiety disorder: Secondary | ICD-10-CM | POA: Diagnosis not present

## 2018-07-10 DIAGNOSIS — F331 Major depressive disorder, recurrent, moderate: Secondary | ICD-10-CM | POA: Diagnosis not present

## 2018-08-12 ENCOUNTER — Other Ambulatory Visit: Payer: Self-pay | Admitting: Family Medicine

## 2018-08-12 DIAGNOSIS — M25561 Pain in right knee: Secondary | ICD-10-CM

## 2018-08-18 DIAGNOSIS — F331 Major depressive disorder, recurrent, moderate: Secondary | ICD-10-CM | POA: Diagnosis not present

## 2018-08-18 DIAGNOSIS — F429 Obsessive-compulsive disorder, unspecified: Secondary | ICD-10-CM | POA: Diagnosis not present

## 2018-08-18 DIAGNOSIS — F411 Generalized anxiety disorder: Secondary | ICD-10-CM | POA: Diagnosis not present

## 2018-08-21 DIAGNOSIS — F429 Obsessive-compulsive disorder, unspecified: Secondary | ICD-10-CM | POA: Diagnosis not present

## 2018-08-21 DIAGNOSIS — F331 Major depressive disorder, recurrent, moderate: Secondary | ICD-10-CM | POA: Diagnosis not present

## 2018-08-21 DIAGNOSIS — F411 Generalized anxiety disorder: Secondary | ICD-10-CM | POA: Diagnosis not present

## 2018-09-07 DIAGNOSIS — F411 Generalized anxiety disorder: Secondary | ICD-10-CM | POA: Diagnosis not present

## 2018-09-07 DIAGNOSIS — F331 Major depressive disorder, recurrent, moderate: Secondary | ICD-10-CM | POA: Diagnosis not present

## 2018-09-16 DIAGNOSIS — F411 Generalized anxiety disorder: Secondary | ICD-10-CM | POA: Diagnosis not present

## 2018-09-16 DIAGNOSIS — F331 Major depressive disorder, recurrent, moderate: Secondary | ICD-10-CM | POA: Diagnosis not present

## 2018-09-22 ENCOUNTER — Other Ambulatory Visit: Payer: Self-pay | Admitting: Family Medicine

## 2018-09-22 DIAGNOSIS — M25561 Pain in right knee: Secondary | ICD-10-CM

## 2018-09-23 NOTE — Telephone Encounter (Signed)
Requested medication (s) are due for refill today: yes  Requested medication (s) are on the active medication list: yes  Last refill:  08/12/2018  Future visit scheduled: no  Notes to clinic:  Last office visit 01/17/18; no normal Hgb or Cr in past 360 days; cancelled appt with ortho    Requested Prescriptions  Pending Prescriptions Disp Refills   meloxicam (MOBIC) 7.5 MG tablet [Pharmacy Med Name: MELOXICAM 7.5MG  TABLETS] 30 tablet 0    Sig: TAKE 1 TABLET(7.5 MG) BY MOUTH DAILY     Analgesics:  COX2 Inhibitors Failed - 09/22/2018  8:19 PM      Failed - HGB in normal range and within 360 days    No results found for: HGB, HGBKUC, HGBPOCKUC       Failed - Cr in normal range and within 360 days    No results found for: CREATININE       Passed - Patient is not pregnant      Passed - Valid encounter within last 12 months    Recent Outpatient Visits          7 months ago Recurrent pain of right knee   [facility], Asencion Partridge, MD   8 months ago Recurrent pain of right knee   [facility], Asencion Partridge, MD   8 months ago Recurrent pain of right knee   [facility], Asencion Partridge, MD

## 2018-09-28 NOTE — Telephone Encounter (Signed)
Temporarily refilled, but I have not seen her recently to discuss this medication and plan. Please follow-up before current prescription runs out.

## 2018-09-28 NOTE — Telephone Encounter (Signed)
Patient is requesting a refill of the following medications: Requested Prescriptions   Pending Prescriptions Disp Refills  . meloxicam (MOBIC) 7.5 MG tablet [Pharmacy Med Name: MELOXICAM 7.5MG  TABLETS] 30 tablet 0    Sig: TAKE 1 TABLET(7.5 MG) BY MOUTH DAILY    Date of patient request: 09/22/2018 Last office visit: 01/24/2018 Date of last refill:08/12/2018 Last refill amount: 30 Follow up time period per chart:

## 2018-10-28 ENCOUNTER — Other Ambulatory Visit: Payer: Self-pay | Admitting: Family Medicine

## 2018-10-28 DIAGNOSIS — M25561 Pain in right knee: Secondary | ICD-10-CM

## 2018-11-09 DIAGNOSIS — F331 Major depressive disorder, recurrent, moderate: Secondary | ICD-10-CM | POA: Diagnosis not present

## 2018-11-09 DIAGNOSIS — F411 Generalized anxiety disorder: Secondary | ICD-10-CM | POA: Diagnosis not present

## 2018-11-28 ENCOUNTER — Other Ambulatory Visit: Payer: Self-pay | Admitting: Family Medicine

## 2018-11-28 DIAGNOSIS — M25561 Pain in right knee: Secondary | ICD-10-CM

## 2018-11-28 NOTE — Telephone Encounter (Signed)
Pt past due for lab work. Routing to office.

## 2018-11-30 NOTE — Telephone Encounter (Signed)
Please schedule visit - telemed if needed (OV requested in March to discuss med, last OV in July 2019).

## 2018-12-22 NOTE — Telephone Encounter (Signed)
LVM to schedule appt

## 2019-01-05 DIAGNOSIS — F411 Generalized anxiety disorder: Secondary | ICD-10-CM | POA: Diagnosis not present

## 2019-01-05 DIAGNOSIS — F330 Major depressive disorder, recurrent, mild: Secondary | ICD-10-CM | POA: Diagnosis not present

## 2019-01-05 DIAGNOSIS — F429 Obsessive-compulsive disorder, unspecified: Secondary | ICD-10-CM | POA: Diagnosis not present

## 2019-01-25 ENCOUNTER — Encounter: Payer: Self-pay | Admitting: Orthopaedic Surgery

## 2019-01-25 ENCOUNTER — Ambulatory Visit (INDEPENDENT_AMBULATORY_CARE_PROVIDER_SITE_OTHER): Payer: BC Managed Care – PPO | Admitting: Orthopaedic Surgery

## 2019-01-25 ENCOUNTER — Ambulatory Visit: Payer: Self-pay

## 2019-01-25 ENCOUNTER — Other Ambulatory Visit: Payer: Self-pay

## 2019-01-25 VITALS — BP 179/102 | HR 80 | Resp 16 | Ht 69.0 in | Wt 255.0 lb

## 2019-01-25 DIAGNOSIS — M170 Bilateral primary osteoarthritis of knee: Secondary | ICD-10-CM | POA: Diagnosis not present

## 2019-01-25 DIAGNOSIS — G8929 Other chronic pain: Secondary | ICD-10-CM

## 2019-01-25 DIAGNOSIS — M25562 Pain in left knee: Secondary | ICD-10-CM | POA: Diagnosis not present

## 2019-01-25 DIAGNOSIS — M25561 Pain in right knee: Secondary | ICD-10-CM

## 2019-01-25 MED ORDER — BUPIVACAINE HCL 0.5 % IJ SOLN
2.0000 mL | INTRAMUSCULAR | Status: AC | PRN
  Administered 2019-01-25: 09:00:00 2 mL via INTRA_ARTICULAR

## 2019-01-25 MED ORDER — METHYLPREDNISOLONE ACETATE 40 MG/ML IJ SUSP
80.0000 mg | INTRAMUSCULAR | Status: AC | PRN
  Administered 2019-01-25: 09:00:00 80 mg via INTRA_ARTICULAR

## 2019-01-25 MED ORDER — LIDOCAINE HCL 1 % IJ SOLN
2.0000 mL | INTRAMUSCULAR | Status: AC | PRN
  Administered 2019-01-25: 2 mL

## 2019-01-25 MED ORDER — LIDOCAINE HCL 1 % IJ SOLN
2.0000 mL | INTRAMUSCULAR | Status: AC | PRN
  Administered 2019-01-25: 09:00:00 2 mL

## 2019-01-25 NOTE — Progress Notes (Signed)
Office Visit Note   Patient: Julia [name]           Date of Birth: 11-14-1965           MRN: [id number] Visit Date: 01/25/2019              Requested by: Shade FloodGreene, Jeffrey R, MD [address] [place],  [place] [id number] PCP: Patient, No Pcp Per   Assessment & Plan: Visit Diagnoses:  1. Bilateral chronic knee pain   2. Bilateral primary osteoarthritis of knee     Plan: Osteoarthritis both knees.  Long discussion regarding diagnosis and treatment options.  We have discussed weight loss, exercises, anti-inflammatory medicines cortisone injections.  She like to proceed with a cortisone injection to both knees.  Discussed knee replacement at some point in the future  Follow-Up Instructions: Return if symptoms worsen or fail to improve.   Orders:  Orders Placed This Encounter  Procedures  . Large Joint Inj: bilateral knee  . XR KNEE 3 VIEW RIGHT  . XR KNEE 3 VIEW LEFT   No orders of the defined types were placed in this encounter.     Procedures: Large Joint Inj: bilateral knee on 01/25/2019 9:29 AM Indications: diagnostic evaluation Details: 25 G 1.5 in needle, anteromedial approach  Arthrogram: No  Medications (Right): 2 mL lidocaine 1 %; 2 mL bupivacaine 0.5 %; 80 mg methylPREDNISolone acetate 40 MG/ML Medications (Left): 2 mL lidocaine 1 %; 2 mL bupivacaine 0.5 %; 80 mg methylPREDNISolone acetate 40 MG/ML Consent was given by the patient. Immediately prior to procedure a time out was called to verify the correct patient, procedure, equipment, support staff and site/side marked as required. Patient was prepped and draped in the usual sterile fashion.       Clinical Data: No additional findings.   Subjective: Chief Complaint  Patient presents with  . Left Knee - Pain  . Right Knee - Pain   Ms. Julia [name] presents with bilateral knee pain x 4 years, worsening. Her right knee hurts worse than the left. She has popping, clicking, grinding noises, difficulty walking,  difficulty sleeping at night, swelling, weakness. She had arthroscopic surgery to the right knee x 20 years ago in [place]. She is not diabetic. She takes IBU for the pain which does not help.  She thinks she had Visco supplementation in her left knee about a year or 2 ago with some temporary relief.  She is having a lot of trouble with bending stooping and squatting.  She is also having trouble walking her dog related to her knee pain.  She will have some achiness and some swelling.  No proximal or distal pain  HPI  Review of Systems  Constitutional: Positive for fatigue.  HENT: Negative for trouble swallowing.   Eyes: Negative for pain.  Respiratory: Negative for shortness of breath.   Cardiovascular: Positive for leg swelling.  Gastrointestinal: Negative for constipation.  Endocrine: Negative for cold intolerance.  Genitourinary: Negative for difficulty urinating.  Musculoskeletal: Positive for gait problem and joint swelling.  Skin: Negative for rash.  Allergic/Immunologic: Negative for food allergies.  Neurological: Positive for weakness.  Hematological: Does not bruise/bleed easily.  Psychiatric/Behavioral: Positive for sleep disturbance.     Objective: Vital Signs: BP (!) 179/102 (BP Location: Right Arm, Patient Position: Sitting, Cuff Size: Normal)   Pulse 80   Resp 16   Ht 5\' 9"  (1.753 m)   Wt 255 lb (115.7 kg)   BMI 37.66 kg/m   Physical Exam  Constitutional:      Appearance: She is well-developed.  Eyes:     Pupils: Pupils are equal, round, and reactive to light.  Pulmonary:     Effort: Pulmonary effort is normal.  Skin:    General: Skin is warm and dry.  Neurological:     Mental Status: She is alert and oriented to person, place, and time.  Psychiatric:        Behavior: Behavior normal.     Ortho Exam awake alert and oriented x3.  Comfortable sitting.  Lacks a few degrees to full extension of both knees.  No knee effusion.  Large knees.  Predominately  medial joint pain bilaterally.  Did have some pain on the left knee with patella compression associated with crepitation.  No instability.  No popliteal pain.  No calf pain.  No distal edema.  Neurologically intact.  Straight leg raise negative.  Painless range of motion both hips.  Specialty Comments:  No specialty comments available.  Imaging: Xr Knee 3 View Left  Result Date: 01/25/2019 Films of the left knee were obtained in 3 projections standing.  There is similar to the films on the right with narrowing of the medial joint space, subchondral sclerosis and peripheral osteophytes.  No acute changes or ectopic calcification.  Degenerative changes are also noted in the lateral compartment to a lesser degree and the patellofemoral joint.  Films are consistent with moderate osteoarthritis  Xr Knee 3 View Right  Result Date: 01/25/2019 Films of the right knee were obtained in 3 projections standing.  There is about 2 degrees of varus with narrowing of the medial joint space.  In the medial joint space there is subchondral sclerosis and peripheral osteophytes.  There are some mild degenerative changes in the lateral compartment and more moderate changes at the patellofemoral joint with lateral tilt and narrowing of the lateral joint space.  Films are consistent with advanced osteoarthritis.  No acute changes.  No ectopic calcification    PMFS History: Patient Active Problem List   Diagnosis Date Noted  . Bilateral primary osteoarthritis of knee 01/25/2019   History reviewed. No pertinent past medical history.  History reviewed. No pertinent family history.  Past Surgical History:  Procedure Laterality Date  . ABDOMINAL HYSTERECTOMY    . KNEE ARTHROSCOPY     Social History   Occupational History  . Not on file  Tobacco Use  . Smoking status: Never Smoker  . Smokeless tobacco: Never Used  Substance and Sexual Activity  . Alcohol use: Not Currently    Frequency: Never  . Drug use:  Never  . Sexual activity: Yes

## 2019-03-11 DIAGNOSIS — F411 Generalized anxiety disorder: Secondary | ICD-10-CM | POA: Diagnosis not present

## 2019-03-11 DIAGNOSIS — F429 Obsessive-compulsive disorder, unspecified: Secondary | ICD-10-CM | POA: Diagnosis not present

## 2019-03-11 DIAGNOSIS — F331 Major depressive disorder, recurrent, moderate: Secondary | ICD-10-CM | POA: Diagnosis not present

## 2019-03-19 DIAGNOSIS — F429 Obsessive-compulsive disorder, unspecified: Secondary | ICD-10-CM | POA: Diagnosis not present

## 2019-03-19 DIAGNOSIS — F331 Major depressive disorder, recurrent, moderate: Secondary | ICD-10-CM | POA: Diagnosis not present

## 2019-03-19 DIAGNOSIS — F411 Generalized anxiety disorder: Secondary | ICD-10-CM | POA: Diagnosis not present

## 2019-04-19 ENCOUNTER — Other Ambulatory Visit: Payer: Self-pay

## 2019-04-19 DIAGNOSIS — Z20828 Contact with and (suspected) exposure to other viral communicable diseases: Secondary | ICD-10-CM | POA: Diagnosis not present

## 2019-04-19 DIAGNOSIS — Z20822 Contact with and (suspected) exposure to covid-19: Secondary | ICD-10-CM

## 2019-04-20 LAB — NOVEL CORONAVIRUS, NAA: SARS-CoV-2, NAA: DETECTED — AB

## 2019-04-26 DIAGNOSIS — F411 Generalized anxiety disorder: Secondary | ICD-10-CM | POA: Diagnosis not present

## 2019-04-26 DIAGNOSIS — F330 Major depressive disorder, recurrent, mild: Secondary | ICD-10-CM | POA: Diagnosis not present

## 2019-05-21 ENCOUNTER — Other Ambulatory Visit: Payer: Self-pay

## 2019-05-21 DIAGNOSIS — Z20822 Contact with and (suspected) exposure to covid-19: Secondary | ICD-10-CM

## 2019-05-22 LAB — NOVEL CORONAVIRUS, NAA: SARS-CoV-2, NAA: NOT DETECTED

## 2019-06-25 ENCOUNTER — Other Ambulatory Visit: Payer: Self-pay

## 2019-06-25 DIAGNOSIS — Z20822 Contact with and (suspected) exposure to covid-19: Secondary | ICD-10-CM

## 2019-06-27 LAB — NOVEL CORONAVIRUS, NAA: SARS-CoV-2, NAA: NOT DETECTED

## 2019-07-21 DIAGNOSIS — Z20828 Contact with and (suspected) exposure to other viral communicable diseases: Secondary | ICD-10-CM | POA: Diagnosis not present

## 2019-08-18 DIAGNOSIS — F411 Generalized anxiety disorder: Secondary | ICD-10-CM | POA: Diagnosis not present

## 2019-08-18 DIAGNOSIS — F3342 Major depressive disorder, recurrent, in full remission: Secondary | ICD-10-CM | POA: Diagnosis not present

## 2019-10-21 ENCOUNTER — Ambulatory Visit: Payer: BC Managed Care – PPO | Admitting: Orthopaedic Surgery

## 2019-10-27 ENCOUNTER — Encounter: Payer: Self-pay | Admitting: Orthopaedic Surgery

## 2019-10-27 ENCOUNTER — Ambulatory Visit (INDEPENDENT_AMBULATORY_CARE_PROVIDER_SITE_OTHER): Payer: BC Managed Care – PPO | Admitting: Orthopaedic Surgery

## 2019-10-27 ENCOUNTER — Ambulatory Visit: Payer: Self-pay

## 2019-10-27 ENCOUNTER — Other Ambulatory Visit: Payer: Self-pay

## 2019-10-27 VITALS — Ht 68.5 in | Wt 261.0 lb

## 2019-10-27 DIAGNOSIS — M170 Bilateral primary osteoarthritis of knee: Secondary | ICD-10-CM

## 2019-10-27 MED ORDER — TRAMADOL HCL 50 MG PO TABS: ORAL_TABLET | ORAL | 0 refills | Status: DC

## 2019-10-27 NOTE — Progress Notes (Signed)
Office Visit Note   Patient: Julia [name]           Date of Birth: 1966/02/14           MRN: [id number] Visit Date: 10/27/2019              Requested by: No referring provider defined for this encounter. PCP: Patient, No Pcp Per   Assessment & Plan: Visit Diagnoses:  1. Bilateral primary osteoarthritis of knee     Plan: Progressive tricompartmental osteoarthritis both knees.  Long discussion with Julia [name] regarding her arthritis.  She would very much like to investigate bilateral total knee replacements.  Have discussed this in some detail with her but will refer to Dr. Magnus Ivan for further discussion.  Tramadol for pain  Follow-Up Instructions: Return Will refer to Dr. Magnus Ivan for consideration of bilateral total knee replacements.   Orders:  Orders Placed This Encounter  Procedures  . XR KNEE 3 VIEW LEFT  . XR KNEE 3 VIEW RIGHT  . Ambulatory referral to Orthopedic Surgery   Meds ordered this encounter  Medications  . traMADol (ULTRAM) 50 MG tablet    Sig: 1-2 tablets PO BID prn severe pain    Dispense:  30 tablet    Refill:  0      Procedures: No procedures performed   Clinical Data: No additional findings.   Subjective: Chief Complaint  Patient presents with  . Right Knee - Pain  . Left Knee - Pain  Patient presents today for bilateral knee pain. She was last evaluated in July of 2020 and received a cortisone injections bilaterally. She states that the cortisone injections did not help. She is wanting to talk about total knee arthroplasty today. She said that both knees hurt equally the same and all the time. She takes Ibuprofen for pain. She is scheduled to go on vacation later this month and wants to talk about cortisone injections now and if that would affect get signed up for surgery.   HPI  Review of Systems   Objective: Vital Signs: Ht 5' 8.5" (1.74 m)   Wt 261 lb (118.4 kg)   BMI 39.11 kg/m   Physical Exam Constitutional:       Appearance: She is well-developed.  Eyes:     Pupils: Pupils are equal, round, and reactive to light.  Pulmonary:     Effort: Pulmonary effort is normal.  Skin:    General: Skin is warm and dry.  Neurological:     Mental Status: She is alert and oriented to person, place, and time.  Psychiatric:        Behavior: Behavior normal.     Ortho Exam large knees.  BMI is about 39.  I did not feel any effusion.  There is predominant medial joint pain and slight varus with weightbearing.  Full extension and probably 100 degrees of flexion.  No popliteal mass or pain.  No instability.  No calf pain.  No distal edema.  Straight leg raise negative.  Painless range of motion both hips Specialty Comments:  No specialty comments available.  Imaging: XR KNEE 3 VIEW LEFT  Result Date: 10/27/2019 Films of the left knee were obtained in several projections standing.  There are more degenerative changes in the left knee than the right.  There is near bone-on-bone in the medial compartment with subchondral cysts, small peripheral osteophytes and subchondral sclerosis.  To a lesser extent there are changes in the lateral compartment and patellofemoral joint.  No ectopic calcification or acute changes  XR KNEE 3 VIEW RIGHT  Result Date: 10/27/2019 Films of the right knee were obtained in several projections standing and compared to films that were performed in 2020.  There appears to have been some progression of the arthritis particularly in the medial compartment where there is subchondral sclerosis and small subchondral cysts and more narrowing of the joint line.  About 2 to 3 degrees of varus.  Small peripheral osteophytes.  Lesser changes in the lateral compartment and the patellofemoral joint.  No acute changes or ectopic calcification.  Consistent with advanced osteoarthritis    PMFS History: Patient Active Problem List   Diagnosis Date Noted  . Bilateral primary osteoarthritis of knee 01/25/2019    History reviewed. No pertinent past medical history.  History reviewed. No pertinent family history.  Past Surgical History:  Procedure Laterality Date  . ABDOMINAL HYSTERECTOMY    . KNEE ARTHROSCOPY     Social History   Occupational History  . Not on file  Tobacco Use  . Smoking status: Never Smoker  . Smokeless tobacco: Never Used  Substance and Sexual Activity  . Alcohol use: Not Currently  . Drug use: Never  . Sexual activity: Yes

## 2019-11-04 ENCOUNTER — Ambulatory Visit: Payer: BC Managed Care – PPO | Admitting: Orthopaedic Surgery

## 2019-11-05 ENCOUNTER — Encounter: Payer: Self-pay | Admitting: Family Medicine

## 2019-11-05 ENCOUNTER — Other Ambulatory Visit: Payer: Self-pay

## 2019-11-05 ENCOUNTER — Ambulatory Visit (INDEPENDENT_AMBULATORY_CARE_PROVIDER_SITE_OTHER): Payer: BC Managed Care – PPO | Admitting: Family Medicine

## 2019-11-05 VITALS — BP 151/93 | HR 90 | Ht 68.5 in | Wt 264.0 lb

## 2019-11-05 DIAGNOSIS — E6609 Other obesity due to excess calories: Secondary | ICD-10-CM | POA: Diagnosis not present

## 2019-11-05 DIAGNOSIS — M1712 Unilateral primary osteoarthritis, left knee: Secondary | ICD-10-CM | POA: Diagnosis not present

## 2019-11-05 DIAGNOSIS — Z01818 Encounter for other preprocedural examination: Secondary | ICD-10-CM | POA: Diagnosis not present

## 2019-11-05 DIAGNOSIS — E66812 Obesity, class 2: Secondary | ICD-10-CM | POA: Insufficient documentation

## 2019-11-05 DIAGNOSIS — M1711 Unilateral primary osteoarthritis, right knee: Secondary | ICD-10-CM

## 2019-11-05 DIAGNOSIS — Z6839 Body mass index (BMI) 39.0-39.9, adult: Secondary | ICD-10-CM

## 2019-11-05 DIAGNOSIS — R030 Elevated blood-pressure reading, without diagnosis of hypertension: Secondary | ICD-10-CM

## 2019-11-05 NOTE — Progress Notes (Signed)
Office Visit Note   Patient: Julia [name]           Date of Birth: 1965/09/09           MRN: [id number] Visit Date: 11/05/2019 Requested by: No referring provider defined for this encounter. PCP: Patient, No Pcp Per  Subjective: Chief Complaint  Patient presents with  . Establish Care    Patient needs clearance for upcoming surgery    HPI: She is here for preoperative clearance.  She is getting ready to have right total knee arthroplasty per Dr. Cleophas Dunker.  She will eventually need her left knee replaced as well.  Her last surgery was about 20 years ago.  She has never had troubles with anesthesia.  Her blood pressure is occasionally elevated when she is in pain, but she has never been diagnosed with hypertension and is not under any medical treatment.  No history of sleep apnea, no symptoms of it.  She quit smoking about 7 years ago.  She has not had any blood work in a few years.  She currently works at [employer].               ROS:   All other systems were reviewed and are negative.  Objective: Vital Signs: BP (!) 151/93 (BP Location: Left Arm, Patient Position: Sitting, Cuff Size: Normal)   Pulse 90   Ht 5' 8.5" (1.74 m)   Wt 264 lb (119.7 kg)   BMI 39.56 kg/m   Physical Exam:  General:  Alert and oriented, in no acute distress. Pulm:  Breathing unlabored. Psy:  Normal mood, congruent affect. Skin: No rash HEENT:  [place]/AT, PERRLA, EOM Full, no nystagmus.  Funduscopic examination within normal limits.  No conjunctival erythema.  Tympanic membranes are pearly gray with normal landmarks.  External ear canals are normal.  Nasal passages are clear.  Oropharynx is clear.  No significant lymphadenopathy.  No thyromegaly or nodules.  2+ carotid pulses without bruits. CV: Regular rate and rhythm without murmurs, rubs, or gallops.  No peripheral edema.  2+ radial and posterior tibial pulses. Lungs: Clear to auscultation throughout with no wheezing or areas of  consolidation. Abdomen: Bowel sounds are active, no hepatosplenomegaly. Knees: 1+ effusion in both knees today.   Imaging: No results found.  Assessment & Plan: 1.  Preoperative clearance evaluation - Her Chales Abrahams Perioperative Cardiac Risk score is 0.02%.  She is at low risk to proceed and is cleared to proceed.  2.  Right knee osteoarthritis -Proceed with knee replacement as scheduled.  Labs and EKG preoperatively.  3.  Obesity -She is working on this.  4.  Elevated blood pressure without hypertension -This is partially affected by pain.  We will continue to monitor.  5.  Left knee osteoarthritis -She will eventually need this replaced.     Procedures: No procedures performed  No notes on file     PMFS History: Patient Active Problem List   Diagnosis Date Noted  . Bilateral primary osteoarthritis of knee 01/25/2019   History reviewed. No pertinent past medical history.  Family History  Problem Relation Age of Onset  . Cancer Mother     Past Surgical History:  Procedure Laterality Date  . ABDOMINAL HYSTERECTOMY    . KNEE ARTHROSCOPY     Social History   Occupational History  . Not on file  Tobacco Use  . Smoking status: Never Smoker  . Smokeless tobacco: Never Used  Substance and Sexual Activity  . Alcohol use:  Not Currently  . Drug use: Never  . Sexual activity: Yes

## 2019-11-09 ENCOUNTER — Other Ambulatory Visit: Payer: Self-pay

## 2019-11-15 DIAGNOSIS — F411 Generalized anxiety disorder: Secondary | ICD-10-CM | POA: Diagnosis not present

## 2019-11-16 ENCOUNTER — Ambulatory Visit: Payer: BC Managed Care – PPO | Admitting: Orthopaedic Surgery

## 2019-11-16 ENCOUNTER — Encounter: Payer: Self-pay | Admitting: Orthopaedic Surgery

## 2019-11-23 ENCOUNTER — Encounter: Payer: Self-pay | Admitting: Orthopaedic Surgery

## 2019-11-23 DIAGNOSIS — M5137 Other intervertebral disc degeneration, lumbosacral region: Secondary | ICD-10-CM | POA: Diagnosis not present

## 2019-11-23 DIAGNOSIS — M9903 Segmental and somatic dysfunction of lumbar region: Secondary | ICD-10-CM | POA: Diagnosis not present

## 2019-11-23 DIAGNOSIS — M9905 Segmental and somatic dysfunction of pelvic region: Secondary | ICD-10-CM | POA: Diagnosis not present

## 2019-11-23 DIAGNOSIS — M25551 Pain in right hip: Secondary | ICD-10-CM | POA: Diagnosis not present

## 2019-11-24 DIAGNOSIS — M25551 Pain in right hip: Secondary | ICD-10-CM | POA: Diagnosis not present

## 2019-11-24 DIAGNOSIS — M5137 Other intervertebral disc degeneration, lumbosacral region: Secondary | ICD-10-CM | POA: Diagnosis not present

## 2019-11-24 DIAGNOSIS — M9905 Segmental and somatic dysfunction of pelvic region: Secondary | ICD-10-CM | POA: Diagnosis not present

## 2019-11-24 DIAGNOSIS — M9903 Segmental and somatic dysfunction of lumbar region: Secondary | ICD-10-CM | POA: Diagnosis not present

## 2019-11-25 DIAGNOSIS — M9903 Segmental and somatic dysfunction of lumbar region: Secondary | ICD-10-CM | POA: Diagnosis not present

## 2019-11-25 DIAGNOSIS — M25551 Pain in right hip: Secondary | ICD-10-CM | POA: Diagnosis not present

## 2019-11-25 DIAGNOSIS — M9905 Segmental and somatic dysfunction of pelvic region: Secondary | ICD-10-CM | POA: Diagnosis not present

## 2019-11-25 DIAGNOSIS — M5137 Other intervertebral disc degeneration, lumbosacral region: Secondary | ICD-10-CM | POA: Diagnosis not present

## 2019-11-29 DIAGNOSIS — M25551 Pain in right hip: Secondary | ICD-10-CM | POA: Diagnosis not present

## 2019-11-29 DIAGNOSIS — M9905 Segmental and somatic dysfunction of pelvic region: Secondary | ICD-10-CM | POA: Diagnosis not present

## 2019-11-29 DIAGNOSIS — M9903 Segmental and somatic dysfunction of lumbar region: Secondary | ICD-10-CM | POA: Diagnosis not present

## 2019-11-29 DIAGNOSIS — M5137 Other intervertebral disc degeneration, lumbosacral region: Secondary | ICD-10-CM | POA: Diagnosis not present

## 2019-11-30 DIAGNOSIS — M5137 Other intervertebral disc degeneration, lumbosacral region: Secondary | ICD-10-CM | POA: Diagnosis not present

## 2019-11-30 DIAGNOSIS — M25551 Pain in right hip: Secondary | ICD-10-CM | POA: Diagnosis not present

## 2019-11-30 DIAGNOSIS — M9905 Segmental and somatic dysfunction of pelvic region: Secondary | ICD-10-CM | POA: Diagnosis not present

## 2019-11-30 DIAGNOSIS — M9903 Segmental and somatic dysfunction of lumbar region: Secondary | ICD-10-CM | POA: Diagnosis not present

## 2019-12-02 DIAGNOSIS — M25551 Pain in right hip: Secondary | ICD-10-CM | POA: Diagnosis not present

## 2019-12-02 DIAGNOSIS — M5137 Other intervertebral disc degeneration, lumbosacral region: Secondary | ICD-10-CM | POA: Diagnosis not present

## 2019-12-02 DIAGNOSIS — M9903 Segmental and somatic dysfunction of lumbar region: Secondary | ICD-10-CM | POA: Diagnosis not present

## 2019-12-02 DIAGNOSIS — M9905 Segmental and somatic dysfunction of pelvic region: Secondary | ICD-10-CM | POA: Diagnosis not present

## 2019-12-02 NOTE — Patient Instructions (Signed)
DUE TO COVID-19 ONLY ONE VISITOR IS ALLOWED TO COME WITH YOU AND STAY IN THE WAITING ROOM ONLY DURING PRE OP AND PROCEDURE DAY OF SURGERY. THE 2 VISITORS  MAY VISIT WITH YOU AFTER SURGERY IN YOUR PRIVATE ROOM DURING VISITING HOURS ONLY!  YOU NEED TO HAVE A COVID 19 TEST ON_5/28______ @__2 :55_____, THIS TEST MUST BE DONE BEFORE SURGERY, COME  [address], [place] , .  [facility]) ONCE YOUR COVID TEST IS COMPLETED, PLEASE BEGIN THE QUARANTINE INSTRUCTIONS AS OUTLINED IN YOUR HANDOUT.                [facility]    Your procedure is scheduled on: 12/14/19   Report to [facility] Main  Entrance   Report to Short Stay at 5:30  AM     Call this number if you have problems the morning of surgery [phone]    BRUSH YOUR TEETH MORNING OF SURGERY AND RINSE YOUR MOUTH OUT, NO CHEWING GUM CANDY OR MINTS.  Do not eat food After Midnight.   YOU MAY HAVE CLEAR LIQUIDS FROM MIDNIGHT UNTIL 4:30AM.   At 4:30AM Please finish the prescribed Pre-Surgery drink.   Nothing by mouth after you finish the  drink !    Take these medicines the morning of surgery with A SIP OF WATER: Klonopin, Vilazone                               You may not have any metal on your body including hair pins and              piercings  Do not wear jewelry, make-up, lotions, powders or perfumes, deodorant             Do not wear nail polish on your fingernails.  Do not shave  48 hours prior to surgery.     Do not bring valuables to the hospital. [facility] IS NOT             RESPONSIBLE   FOR VALUABLES.  Contacts, dentures or bridgework may not be worn into surgery.      Patients discharged the day of surgery will not be allowed to drive home.   IF YOU ARE HAVING SURGERY AND GOING HOME THE SAME DAY, YOU MUST HAVE AN ADULT TO DRIVE YOU HOME AND BE WITH YOU FOR 24 HOURS.   YOU MAY GO HOME BY TAXI OR UBER OR ORTHERWISE, BUT AN ADULT MUST ACCOMPANY YOU HOME AND STAY WITH YOU FOR  24 HOURS.  Name and phone number of your driver:  Special Instructions: N/A              Please read over the following fact sheets you were given: _____________________________________________________________________             [facility] - Preparing for Surgery Before surgery, you can play an important role .  Because skin is not sterile, your skin needs to be as free of germs as possible.   You can reduce the number of germs on your skin by washing with CHG (chlorahexidine gluconate) soap before surgery .  CHG is an antiseptic cleaner which kills germs and bonds with the skin to continue killing germs even after washing. Please DO NOT use if you have an allergy to CHG or antibacterial soaps.   If your skin becomes reddened/irritated stop using the CHG and inform your nurse  when you arrive at Short Stay. .  You may shave your face/neck.  Please follow these instructions carefully:  1.  Shower with CHG Soap the night before surgery and the  morning of Surgery.  2.  If you choose to wash your hair, wash your hair first as usual with your  normal  shampoo.  3.  After you shampoo, rinse your hair and body thoroughly to remove the  shampoo.                                        4.  Use CHG as you would any other liquid soap.  You can apply chg directly  to the skin and wash                       Gently with a scrungie or clean washcloth.  5.  Apply the CHG Soap to your body ONLY FROM THE NECK DOWN.   Do not use on face/ open                           Wound or open sores. Avoid contact with eyes, ears mouth and genitals (private parts).                       Wash face,  Genitals (private parts) with your normal soap.             6.  Wash thoroughly, paying special attention to the area where your surgery  will be performed.  7.  Thoroughly rinse your body with warm water from the neck down.  8.  DO NOT shower/wash with your normal soap after using and rinsing off  the CHG Soap.              9.  Pat yourself dry with a clean towel.            10.  Wear clean pajamas.            11.  Place clean sheets on your bed the night of your first shower and do not  sleep with pets. Day of Surgery : Do not apply any lotions/deodorants the morning of surgery.  Please wear clean clothes to the hospital/surgery center.  FAILURE TO FOLLOW THESE INSTRUCTIONS MAY RESULT IN THE CANCELLATION OF YOUR SURGERY PATIENT SIGNATURE_________________________________  NURSE SIGNATURE__________________________________  ________________________________________________________________________   Julia [name]  An incentive spirometer is a tool that can help keep your lungs clear and active. This tool measures how well you are filling your lungs with each breath. Taking long deep breaths may help reverse or decrease the chance of developing breathing (pulmonary) problems (especially infection) following:  A long period of time when you are unable to move or be active. BEFORE THE PROCEDURE   If the spirometer includes an indicator to show your best effort, your nurse or respiratory therapist will set it to a desired goal.  If possible, sit up straight or lean slightly forward. Try not to slouch.  Hold the incentive spirometer in an upright position. INSTRUCTIONS FOR USE  1. Sit on the edge of your bed if possible, or sit up as far as you can in bed or on a chair. 2. Hold the incentive spirometer in an upright position. 3. Breathe out normally. 4. Place the mouthpiece in  your mouth and seal your lips tightly around it. 5. Breathe in slowly and as deeply as possible, raising the piston or the ball toward the top of the column. 6. Hold your breath for 3-5 seconds or for as long as possible. Allow the piston or ball to fall to the bottom of the column. 7. Remove the mouthpiece from your mouth and breathe out normally. 8. Rest for a few seconds and repeat Steps 1 through 7 at least 10 times every  1-2 hours when you are awake. Take your time and take a few normal breaths between deep breaths. 9. The spirometer may include an indicator to show your best effort. Use the indicator as a goal to work toward during each repetition. 10. After each set of 10 deep breaths, practice coughing to be sure your lungs are clear. If you have an incision (the cut made at the time of surgery), support your incision when coughing by placing a pillow or rolled up towels firmly against it. Once you are able to get out of bed, walk around indoors and cough well. You may stop using the incentive spirometer when instructed by your caregiver.  RISKS AND COMPLICATIONS  Take your time so you do not get dizzy or light-headed.  If you are in pain, you may need to take or ask for pain medication before doing incentive spirometry. It is harder to take a deep breath if you are having pain. AFTER USE  Rest and breathe slowly and easily.  It can be helpful to keep track of a log of your progress. Your caregiver can provide you with a simple table to help with this. If you are using the spirometer at home, follow these instructions: SEEK MEDICAL CARE IF:   You are having difficultly using the spirometer.  You have trouble using the spirometer as often as instructed.  Your pain medication is not giving enough relief while using the spirometer.  You develop fever of 100.5 F (38.1 C) or higher. SEEK IMMEDIATE MEDICAL CARE IF:   You cough up bloody sputum that had not been present before.  You develop fever of 102 F (38.9 C) or greater.  You develop worsening pain at or near the incision site. MAKE SURE YOU:   Understand these instructions.  Will watch your condition.  Will get help right away if you are not doing well or get worse. Document Released: 11/11/2006 Document Revised: 09/23/2011 Document Reviewed: 01/12/2007 [facility] Patient Information 2014 [place],  [place].   ________________________________________________________________________

## 2019-12-03 ENCOUNTER — Telehealth: Payer: Self-pay | Admitting: Family Medicine

## 2019-12-03 ENCOUNTER — Other Ambulatory Visit: Payer: Self-pay

## 2019-12-03 ENCOUNTER — Encounter (HOSPITAL_COMMUNITY)
Admission: RE | Admit: 2019-12-03 | Discharge: 2019-12-03 | Disposition: A | Discharge status: Home / Self Care | Payer: BC Managed Care – PPO | Source: Ambulatory Visit | Attending: Orthopaedic Surgery | Admitting: Orthopaedic Surgery

## 2019-12-03 ENCOUNTER — Encounter (HOSPITAL_COMMUNITY): Payer: Self-pay

## 2019-12-03 ENCOUNTER — Ambulatory Visit (HOSPITAL_COMMUNITY)
Admission: RE | Admit: 2019-12-03 | Discharge: 2019-12-03 | Disposition: A | Discharge status: Home / Self Care | Payer: BC Managed Care – PPO | Source: Ambulatory Visit | Attending: Orthopedic Surgery | Admitting: Orthopedic Surgery

## 2019-12-03 ENCOUNTER — Encounter: Payer: Self-pay | Admitting: Family Medicine

## 2019-12-03 DIAGNOSIS — Z01818 Encounter for other preprocedural examination: Secondary | ICD-10-CM | POA: Diagnosis not present

## 2019-12-03 HISTORY — DX: Anxiety disorder, unspecified: F41.9

## 2019-12-03 HISTORY — DX: Depression, unspecified: F32.A

## 2019-12-03 HISTORY — DX: Unspecified osteoarthritis, unspecified site: M19.90

## 2019-12-03 LAB — COMPREHENSIVE METABOLIC PANEL
ALT: 34 U/L (ref 0–44)
AST: 43 U/L — ABNORMAL HIGH (ref 15–41)
Albumin: 4.4 g/dL (ref 3.5–5.0)
Alkaline Phosphatase: 62 U/L (ref 38–126)
Anion gap: 8 (ref 5–15)
BUN: 12 mg/dL (ref 6–20)
CO2: 27 mmol/L (ref 22–32)
Calcium: 9.2 mg/dL (ref 8.9–10.3)
Chloride: 107 mmol/L (ref 98–111)
Creatinine, Ser: 0.71 mg/dL (ref 0.44–1.00)
GFR calc Af Amer: >60 mL/min (ref >60)
GFR calc non Af Amer: >60 mL/min (ref >60)
Glucose, Bld: 108 mg/dL — ABNORMAL HIGH (ref 70–99)
Potassium: 4.2 mmol/L (ref 3.5–5.1)
Sodium: 142 mmol/L (ref 135–145)
Total Bilirubin: 0.5 mg/dL (ref 0.3–1.2)
Total Protein: 7.7 g/dL (ref 6.5–8.1)

## 2019-12-03 LAB — TYPE AND SCREEN
ABO/RH(D): O NEG
Antibody Screen: NEGATIVE

## 2019-12-03 LAB — CBC WITH DIFFERENTIAL/PLATELET
Abs Immature Granulocytes: 0.01 10*3/uL (ref 0.00–0.07)
Basophils Absolute: 0 10*3/uL (ref 0.0–0.1)
Basophils Relative: 1 %
Eosinophils Absolute: 0.1 10*3/uL (ref 0.0–0.5)
Eosinophils Relative: 3 %
HCT: 41.3 % (ref 36.0–46.0)
Hemoglobin: 13.4 g/dL (ref 12.0–15.0)
Immature Granulocytes: 0 %
Lymphocytes Relative: 24 %
Lymphs Abs: 1.2 10*3/uL (ref 0.7–4.0)
MCH: 31.5 pg (ref 26.0–34.0)
MCHC: 32.4 g/dL (ref 30.0–36.0)
MCV: 97.2 fL (ref 80.0–100.0)
Monocytes Absolute: 0.3 10*3/uL (ref 0.1–1.0)
Monocytes Relative: 6 %
Neutro Abs: 3.3 10*3/uL (ref 1.7–7.7)
Neutrophils Relative %: 66 %
Platelets: 353 10*3/uL (ref 150–400)
RBC: 4.25 MIL/uL (ref 3.87–5.11)
RDW: 13.7 % (ref 11.5–15.5)
WBC: 5 10*3/uL (ref 4.0–10.5)
nRBC: 0 % (ref 0.0–0.2)

## 2019-12-03 LAB — SURGICAL PCR SCREEN
MRSA, PCR: NEGATIVE
Staphylococcus aureus: NEGATIVE

## 2019-12-03 LAB — PROTIME-INR
INR: 1 (ref 0.8–1.2)
Prothrombin Time: 12.5 seconds (ref 11.4–15.2)

## 2019-12-03 LAB — ABO/RH: ABO/RH(D): O NEG

## 2019-12-03 LAB — APTT: aPTT: 30 seconds (ref 24–36)

## 2019-12-03 MED ORDER — LISINOPRIL-HYDROCHLOROTHIAZIDE 10-12.5 MG PO TABS: ORAL_TABLET | ORAL | 6 refills | Status: DC

## 2019-12-03 NOTE — Telephone Encounter (Signed)
Dr. Prince Rome has communicated with the patient through MyChart  - he sent in a blood pressure medication to her pharmacy. The patient had also sent a request for an appointment through MyChart, to get blood pressure medication. She advised that we could disregard that request, since all has already been handled.

## 2019-12-03 NOTE — Progress Notes (Signed)
PCP - Dr. Judie Petit. [place] Cardiologist - no  Chest x-ray - 12/03/19 EKG - 12/03/19 Stress Test - no ECHO no-  Cardiac Cath - no  Sleep Study - no CPAP -   Fasting Blood Sugar - NA Checks Blood Sugar _____ times a day  Blood Thinner Instructions:NA Aspirin Instructions: Last Dose:  Anesthesia review:   Patient denies shortness of breath, fever, cough and chest pain at PAT appointment  yes Patient verbalized understanding of instructions that were given to them at the PAT appointment. Patient was also instructed that they will need to review over the PAT instructions again at home before surgery. Yes  Pt's BP was 158/111 she will call Dr. Prince Rome today.

## 2019-12-03 NOTE — Telephone Encounter (Signed)
Patient called.   Blood pressure spiked. Needs a rx for a blood pressure medicine in preparation for her surgery.   Call back: [phone]

## 2019-12-04 LAB — URINE CULTURE: Culture: NO GROWTH

## 2019-12-06 DIAGNOSIS — M25551 Pain in right hip: Secondary | ICD-10-CM | POA: Diagnosis not present

## 2019-12-06 DIAGNOSIS — M9903 Segmental and somatic dysfunction of lumbar region: Secondary | ICD-10-CM | POA: Diagnosis not present

## 2019-12-06 DIAGNOSIS — M5137 Other intervertebral disc degeneration, lumbosacral region: Secondary | ICD-10-CM | POA: Diagnosis not present

## 2019-12-06 DIAGNOSIS — M5136 Other intervertebral disc degeneration, lumbar region: Secondary | ICD-10-CM | POA: Diagnosis not present

## 2019-12-06 DIAGNOSIS — M9905 Segmental and somatic dysfunction of pelvic region: Secondary | ICD-10-CM | POA: Diagnosis not present

## 2019-12-07 ENCOUNTER — Other Ambulatory Visit: Payer: Self-pay

## 2019-12-07 ENCOUNTER — Encounter: Payer: Self-pay | Admitting: Orthopaedic Surgery

## 2019-12-07 ENCOUNTER — Ambulatory Visit (INDEPENDENT_AMBULATORY_CARE_PROVIDER_SITE_OTHER): Payer: BC Managed Care – PPO | Admitting: Orthopaedic Surgery

## 2019-12-07 VITALS — BP 121/74 | HR 86 | Ht 68.0 in | Wt 259.0 lb

## 2019-12-07 DIAGNOSIS — E6609 Other obesity due to excess calories: Secondary | ICD-10-CM

## 2019-12-07 DIAGNOSIS — M1711 Unilateral primary osteoarthritis, right knee: Secondary | ICD-10-CM | POA: Diagnosis not present

## 2019-12-07 DIAGNOSIS — Z6839 Body mass index (BMI) 39.0-39.9, adult: Secondary | ICD-10-CM

## 2019-12-07 NOTE — Progress Notes (Signed)
Office Visit Note   Patient: Julia [name]           Date of Birth: 03-22-1966           MRN: [id number] Visit Date: 12/07/2019              Requested by: Lavada Mesi, MD [address] [place],  [place] [address] PCP: Lavada Mesi, MD   Chief Complaint:right knee pain.  HPI: Julia [name], 54 y.o. female, has a history of pain and functional disability in the right knee due to arthritis and has failed non-surgical conservative treatments for greater than 12 weeks to includeNSAID's and/or analgesics, corticosteriod injections, viscosupplementation injections, flexibility and strengthening excercises, weight reduction as appropriate and activity modification.  Onset of symptoms was gradual, starting 5 years ago with rapidlly worsening course since that time. The patient noted prior procedures on the knee to include  arthroscopy on the right knee(s).  Patient currently rates pain in the right knee(s) at 9 out of 10 with activity. Patient has night pain, worsening of pain with activity and weight bearing, pain that interferes with activities of daily living, crepitus and joint swelling.  Patient has evidence of subchondral cysts, subchondral sclerosis, periarticular osteophytes and joint space narrowing by imaging studies.  There is no active infection.  Patient Active Problem List   Diagnosis Date Noted  . Class 2 obesity due to excess calories without serious comorbidity with body mass index (BMI) of 39.0 to 39.9 in adult 11/05/2019  . Unilateral primary osteoarthritis, right knee 11/05/2019  . Unilateral primary osteoarthritis, left knee 11/05/2019  . Bilateral primary osteoarthritis of knee 01/25/2019   Past Medical History:  Diagnosis Date  . Anxiety   . Arthritis    knees, hands  . Depression     Past Surgical History:  Procedure Laterality Date  . ABDOMINAL HYSTERECTOMY    . KNEE ARTHROSCOPY      No current facility-administered medications for this encounter.   Current  Outpatient Medications  Medication Sig Dispense Refill Last Dose  . clonazePAM (KLONOPIN) 1 MG tablet Take 1 mg by mouth 2 (two) times daily as needed for anxiety.      Marland Kitchen ibuprofen (ADVIL) 200 MG tablet Take 600 mg by mouth every 6 (six) hours as needed for mild pain or moderate pain.     . Multiple Vitamins-Minerals (MULTIVITAMIN WITH MINERALS) tablet Take 1 tablet by mouth daily. Chewable     . Vilazodone HCl (VIIBRYD) 20 MG TABS Take 20 mg by mouth daily.      . vitamin B-12 (CYANOCOBALAMIN) 1000 MCG tablet Take 1,000 mcg by mouth daily. Chewable     . zolpidem (AMBIEN) 10 MG tablet Take 10 mg by mouth at bedtime as needed for sleep.      Marland Kitchen lisinopril-hydrochlorothiazide (ZESTORETIC) 10-12.5 MG tablet Start 1/2 tab PO qd, may increase to 1 PO qd if needed for BP control. 30 tablet 6    No Known Allergies  Social History   Tobacco Use  . Smoking status: Former Smoker    Packs/day: 1.00    Years: 15.00    Pack years: 15.00    Types: Cigarettes    Quit date: 12/02/2012    Years since quitting: 7.0  . Smokeless tobacco: Never Used  Substance Use Topics  . Alcohol use: Not Currently    Family History  Problem Relation Age of Onset  . Cancer Mother      Review of Systems  Review of Systems  Constitutional: Negative for fatigue.  HENT: Negative for ear pain.   Eyes: Negative for pain.  Respiratory: Negative for shortness of breath.   Cardiovascular: Negative for leg swelling.  Gastrointestinal: Negative for constipation and diarrhea.  Endocrine: Negative for cold intolerance and heat intolerance.  Genitourinary: Negative for difficulty urinating.  Musculoskeletal: Positive for joint swelling.  Skin: Negative for rash.  Allergic/Immunologic: Negative for food allergies.  Neurological: Negative for weakness.  Hematological: Does not bruise/bleed easily.  Psychiatric/Behavioral: Positive for sleep disturbance.   Objective:  Physical Exam  Constitutional: She is oriented to  person, place, and time. She appears well-developed and well-nourished.  HENT:  Head: Normocephalic and atraumatic.  Eyes: Pupils are equal, round, and reactive to light. Conjunctivae and EOM are normal.  Neck: No thyromegaly present.  Cardiovascular: Normal rate, regular rhythm and intact distal pulses.  Murmur heard. Respiratory: Effort normal and breath sounds normal. She has no wheezes.  GI: Soft. Bowel sounds are normal. There is no abdominal tenderness.  Musculoskeletal:     Cervical back: Normal range of motion and neck supple.  Neurological: She is alert and oriented to person, place, and time.  Skin: Skin is warm and dry.  Psychiatric: She has a normal mood and affect. Her behavior is normal. Judgment and thought content normal.  Right knee: 0-100 degrees  Vital signs in last 24 hours: Pulse Rate:  [86] 86 (05/25 1257) BP: (121)/(74) 121/74 (05/25 1257) Weight:  [117.5 kg] 117.5 kg (05/25 1257)  Labs:   Estimated body mass index is 39.38 kg/m as calculated from the following:   Height as of 12/07/19: 5\' 8"  (1.727 m).   Weight as of 12/07/19: 117.5 kg.   Imaging Review Plain radiographs demonstrate moderate degenerative joint disease of the right knee(s). The overall alignment ismild varus. The bone quality appears to be good for age and reported activity level.      Assessment/Plan:  End stage arthritis, right knee   The patient history, physical examination, clinical judgment of the provider and imaging studies are consistent with end stage degenerative joint disease of the right knee(s) and total knee arthroplasty is deemed medically necessary. The treatment options including medical management, injection therapy arthroscopy and arthroplasty were discussed at length. The risks and benefits of total knee arthroplasty were presented and reviewed. The risks due to aseptic loosening, infection, stiffness, patella tracking problems, thromboembolic complications and other  imponderables were discussed. The patient acknowledged the explanation, agreed to proceed with the plan and consent was signed. Patient is being admitted for inpatient treatment for surgery, pain control, PT, OT, prophylactic antibiotics, VTE prophylaxis, progressive ambulation and ADL's and discharge planning. The patient is planning to be discharged home with home health services   Mike Craze. Micheal Likens [phone]  12/07/2019 1:59 PM

## 2019-12-07 NOTE — H&P (Signed)
TOTAL KNEE ADMISSION H&P  Patient is being admitted for right total knee arthroplasty.  Subjective:  Chief Complaint:right knee pain.  HPI: Julia [name], 54 y.o. female, has a history of pain and functional disability in the right knee due to arthritis and has failed non-surgical conservative treatments for greater than 12 weeks to includeNSAID's and/or analgesics, corticosteriod injections, viscosupplementation injections, flexibility and strengthening excercises, weight reduction as appropriate and activity modification.  Onset of symptoms was gradual, starting 5 years ago with rapidlly worsening course since that time. The patient noted prior procedures on the knee to include  arthroscopy on the right knee(s).  Patient currently rates pain in the right knee(s) at 9 out of 10 with activity. Patient has night pain, worsening of pain with activity and weight bearing, pain that interferes with activities of daily living, crepitus and joint swelling.  Patient has evidence of subchondral cysts, subchondral sclerosis, periarticular osteophytes and joint space narrowing by imaging studies.  There is no active infection.  Patient Active Problem List   Diagnosis Date Noted  . Class 2 obesity due to excess calories without serious comorbidity with body mass index (BMI) of 39.0 to 39.9 in adult 11/05/2019  . Unilateral primary osteoarthritis, right knee 11/05/2019  . Unilateral primary osteoarthritis, left knee 11/05/2019  . Bilateral primary osteoarthritis of knee 01/25/2019   Past Medical History:  Diagnosis Date  . Anxiety   . Arthritis    knees, hands  . Depression     Past Surgical History:  Procedure Laterality Date  . ABDOMINAL HYSTERECTOMY    . KNEE ARTHROSCOPY      No current facility-administered medications for this encounter.   Current Outpatient Medications  Medication Sig Dispense Refill Last Dose  . clonazePAM (KLONOPIN) 1 MG tablet Take 1 mg by mouth 2 (two) times daily as  needed for anxiety.      Marland Kitchen ibuprofen (ADVIL) 200 MG tablet Take 600 mg by mouth every 6 (six) hours as needed for mild pain or moderate pain.     . Multiple Vitamins-Minerals (MULTIVITAMIN WITH MINERALS) tablet Take 1 tablet by mouth daily. Chewable     . Vilazodone HCl (VIIBRYD) 20 MG TABS Take 20 mg by mouth daily.      . vitamin B-12 (CYANOCOBALAMIN) 1000 MCG tablet Take 1,000 mcg by mouth daily. Chewable     . zolpidem (AMBIEN) 10 MG tablet Take 10 mg by mouth at bedtime as needed for sleep.      Marland Kitchen lisinopril-hydrochlorothiazide (ZESTORETIC) 10-12.5 MG tablet Start 1/2 tab PO qd, may increase to 1 PO qd if needed for BP control. 30 tablet 6    No Known Allergies  Social History   Tobacco Use  . Smoking status: Former Smoker    Packs/day: 1.00    Years: 15.00    Pack years: 15.00    Types: Cigarettes    Quit date: 12/02/2012    Years since quitting: 7.0  . Smokeless tobacco: Never Used  Substance Use Topics  . Alcohol use: Not Currently    Family History  Problem Relation Age of Onset  . Cancer Mother      Review of Systems  Review of Systems  Constitutional: Negative for fatigue.  HENT: Negative for ear pain.   Eyes: Negative for pain.  Respiratory: Negative for shortness of breath.   Cardiovascular: Negative for leg swelling.  Gastrointestinal: Negative for constipation and diarrhea.  Endocrine: Negative for cold intolerance and heat intolerance.  Genitourinary: Negative for difficulty urinating.  Musculoskeletal: Positive for joint swelling.  Skin: Negative for rash.  Allergic/Immunologic: Negative for food allergies.  Neurological: Negative for weakness.  Hematological: Does not bruise/bleed easily.  Psychiatric/Behavioral: Positive for sleep disturbance.   Objective:  Physical Exam  Constitutional: She is oriented to person, place, and time. She appears well-developed and well-nourished.  HENT:  Head: Normocephalic and atraumatic.  Eyes: Pupils are equal,  round, and reactive to light. Conjunctivae and EOM are normal.  Neck: No thyromegaly present.  Cardiovascular: Normal rate, regular rhythm and intact distal pulses.  Murmur heard. Respiratory: Effort normal and breath sounds normal. She has no wheezes.  GI: Soft. Bowel sounds are normal. There is no abdominal tenderness.  Musculoskeletal:     Cervical back: Normal range of motion and neck supple.  Neurological: She is alert and oriented to person, place, and time.  Skin: Skin is warm and dry.  Psychiatric: She has a normal mood and affect. Her behavior is normal. Judgment and thought content normal.  Right knee: 0-100 degrees  Vital signs in last 24 hours: Pulse Rate:  [86] 86 (05/25 1257) BP: (121)/(74) 121/74 (05/25 1257) Weight:  [117.5 kg] 117.5 kg (05/25 1257)  Labs:   Estimated body mass index is 39.38 kg/m as calculated from the following:   Height as of 12/07/19: 5\' 8"  (1.727 m).   Weight as of 12/07/19: 117.5 kg.   Imaging Review Plain radiographs demonstrate moderate degenerative joint disease of the right knee(s). The overall alignment ismild varus. The bone quality appears to be good for age and reported activity level.      Assessment/Plan:  End stage arthritis, right knee   The patient history, physical examination, clinical judgment of the provider and imaging studies are consistent with end stage degenerative joint disease of the right knee(s) and total knee arthroplasty is deemed medically necessary. The treatment options including medical management, injection therapy arthroscopy and arthroplasty were discussed at length. The risks and benefits of total knee arthroplasty were presented and reviewed. The risks due to aseptic loosening, infection, stiffness, patella tracking problems, thromboembolic complications and other imponderables were discussed. The patient acknowledged the explanation, agreed to proceed with the plan and consent was signed. Patient is being  admitted for inpatient treatment for surgery, pain control, PT, OT, prophylactic antibiotics, VTE prophylaxis, progressive ambulation and ADL's and discharge planning. The patient is planning to be discharged home with home health services     Patient's anticipated LOS is less than 2 midnights, meeting these requirements: - Younger than 49 - Lives within 1 hour of care - Has a competent adult at home to recover with post-op recover - NO history of  - Chronic pain requiring opiods  - Diabetes  - Coronary Artery Disease  - Heart failure  - Heart attack  - Stroke  - DVT/VTE  - Cardiac arrhythmia  - Respiratory Failure/COPD  - Renal failure  - Anemia  - Advanced Liver disease   Mike Craze. Micheal Likens [phone]  12/07/2019 1:58 PM

## 2019-12-08 DIAGNOSIS — M25551 Pain in right hip: Secondary | ICD-10-CM | POA: Diagnosis not present

## 2019-12-08 DIAGNOSIS — M9905 Segmental and somatic dysfunction of pelvic region: Secondary | ICD-10-CM | POA: Diagnosis not present

## 2019-12-08 DIAGNOSIS — M5137 Other intervertebral disc degeneration, lumbosacral region: Secondary | ICD-10-CM | POA: Diagnosis not present

## 2019-12-08 DIAGNOSIS — M9903 Segmental and somatic dysfunction of lumbar region: Secondary | ICD-10-CM | POA: Diagnosis not present

## 2019-12-09 DIAGNOSIS — F411 Generalized anxiety disorder: Secondary | ICD-10-CM | POA: Diagnosis not present

## 2019-12-09 DIAGNOSIS — F3342 Major depressive disorder, recurrent, in full remission: Secondary | ICD-10-CM | POA: Diagnosis not present

## 2019-12-10 ENCOUNTER — Other Ambulatory Visit (HOSPITAL_COMMUNITY)
Admission: RE | Admit: 2019-12-10 | Discharge: 2019-12-10 | Disposition: A | Discharge status: Home / Self Care | Payer: BC Managed Care – PPO | Source: Ambulatory Visit | Attending: Orthopaedic Surgery | Admitting: Orthopaedic Surgery

## 2019-12-10 DIAGNOSIS — Z01812 Encounter for preprocedural laboratory examination: Secondary | ICD-10-CM | POA: Diagnosis not present

## 2019-12-10 DIAGNOSIS — Z20822 Contact with and (suspected) exposure to covid-19: Secondary | ICD-10-CM | POA: Diagnosis not present

## 2019-12-11 LAB — SARS CORONAVIRUS 2 (TAT 6-24 HRS): SARS Coronavirus 2: NEGATIVE

## 2019-12-13 MED ORDER — TRANEXAMIC ACID 1000 MG/10ML IV SOLN
2000.0000 mg | INTRAVENOUS | Status: AC
  Filled 2019-12-13: qty 20

## 2019-12-14 ENCOUNTER — Encounter (HOSPITAL_COMMUNITY)
Admission: RE | Disposition: A | Discharge status: Home Health | Payer: Self-pay | Source: Home / Self Care | Attending: Orthopaedic Surgery

## 2019-12-14 ENCOUNTER — Observation Stay (HOSPITAL_COMMUNITY)
Admission: RE | Admit: 2019-12-14 | Discharge: 2019-12-15 | Disposition: A | Discharge status: Home Health | Payer: BC Managed Care – PPO | Attending: Orthopaedic Surgery | Admitting: Orthopaedic Surgery

## 2019-12-14 ENCOUNTER — Ambulatory Visit (HOSPITAL_COMMUNITY): Payer: BC Managed Care – PPO | Admitting: Physician Assistant

## 2019-12-14 ENCOUNTER — Encounter (HOSPITAL_COMMUNITY): Payer: Self-pay | Admitting: Orthopaedic Surgery

## 2019-12-14 ENCOUNTER — Ambulatory Visit (HOSPITAL_COMMUNITY): Payer: BC Managed Care – PPO | Admitting: Certified Registered Nurse Anesthetist

## 2019-12-14 ENCOUNTER — Other Ambulatory Visit: Payer: Self-pay

## 2019-12-14 DIAGNOSIS — Z90710 Acquired absence of both cervix and uterus: Secondary | ICD-10-CM | POA: Insufficient documentation

## 2019-12-14 DIAGNOSIS — F419 Anxiety disorder, unspecified: Secondary | ICD-10-CM | POA: Diagnosis not present

## 2019-12-14 DIAGNOSIS — F329 Major depressive disorder, single episode, unspecified: Secondary | ICD-10-CM | POA: Diagnosis not present

## 2019-12-14 DIAGNOSIS — Z6839 Body mass index (BMI) 39.0-39.9, adult: Secondary | ICD-10-CM | POA: Insufficient documentation

## 2019-12-14 DIAGNOSIS — M170 Bilateral primary osteoarthritis of knee: Secondary | ICD-10-CM | POA: Diagnosis not present

## 2019-12-14 DIAGNOSIS — E6609 Other obesity due to excess calories: Secondary | ICD-10-CM

## 2019-12-14 DIAGNOSIS — Z96651 Presence of right artificial knee joint: Secondary | ICD-10-CM

## 2019-12-14 DIAGNOSIS — Z809 Family history of malignant neoplasm, unspecified: Secondary | ICD-10-CM | POA: Diagnosis not present

## 2019-12-14 DIAGNOSIS — Z87891 Personal history of nicotine dependence: Secondary | ICD-10-CM | POA: Insufficient documentation

## 2019-12-14 DIAGNOSIS — Z79899 Other long term (current) drug therapy: Secondary | ICD-10-CM | POA: Diagnosis not present

## 2019-12-14 DIAGNOSIS — E669 Obesity, unspecified: Secondary | ICD-10-CM | POA: Insufficient documentation

## 2019-12-14 DIAGNOSIS — Z791 Long term (current) use of non-steroidal anti-inflammatories (NSAID): Secondary | ICD-10-CM | POA: Diagnosis not present

## 2019-12-14 DIAGNOSIS — M1711 Unilateral primary osteoarthritis, right knee: Secondary | ICD-10-CM

## 2019-12-14 DIAGNOSIS — M65861 Other synovitis and tenosynovitis, right lower leg: Secondary | ICD-10-CM | POA: Diagnosis not present

## 2019-12-14 HISTORY — PX: TOTAL KNEE ARTHROPLASTY: SHX125

## 2019-12-14 SURGERY — ARTHROPLASTY, KNEE, TOTAL
Anesthesia: Spinal | Site: Knee | Laterality: Right

## 2019-12-14 MED ORDER — HYDROMORPHONE HCL 1 MG/ML IJ SOLN
INTRAMUSCULAR | Status: AC
  Filled 2019-12-14: qty 1

## 2019-12-14 MED ORDER — MEPERIDINE HCL 50 MG/ML IJ SOLN: 6.2500 mg | INTRAMUSCULAR | Status: DC | PRN

## 2019-12-14 MED ORDER — ONDANSETRON HCL 4 MG/2ML IJ SOLN
INTRAMUSCULAR | Status: AC
  Filled 2019-12-14: qty 2

## 2019-12-14 MED ORDER — FENTANYL CITRATE (PF) 100 MCG/2ML IJ SOLN
INTRAMUSCULAR | Status: AC
  Filled 2019-12-14: qty 2

## 2019-12-14 MED ORDER — METHOCARBAMOL 500 MG PO TABS: 500.0000 mg | ORAL_TABLET | Freq: Four times a day (QID) | ORAL | Status: DC | PRN

## 2019-12-14 MED ORDER — ONDANSETRON HCL 4 MG/2ML IJ SOLN: 4.0000 mg | Freq: Once | INTRAMUSCULAR | Status: DC | PRN

## 2019-12-14 MED ORDER — HYDROMORPHONE HCL 1 MG/ML IJ SOLN
0.5000 mg | INTRAMUSCULAR | Status: DC | PRN
  Administered 2019-12-14: 0.5 mg via INTRAVENOUS
  Filled 2019-12-14: qty 1

## 2019-12-14 MED ORDER — CEFAZOLIN SODIUM-DEXTROSE 2-4 GM/100ML-% IV SOLN
2.0000 g | Freq: Four times a day (QID) | INTRAVENOUS | Status: AC
  Administered 2019-12-14 (×2): 2 g via INTRAVENOUS
  Filled 2019-12-14 (×2): qty 100

## 2019-12-14 MED ORDER — FENTANYL CITRATE (PF) 100 MCG/2ML IJ SOLN
INTRAMUSCULAR | Status: DC | PRN
  Administered 2019-12-14: 100 ug via INTRAVENOUS

## 2019-12-14 MED ORDER — MIDAZOLAM HCL 5 MG/5ML IJ SOLN
INTRAMUSCULAR | Status: DC | PRN
  Administered 2019-12-14: 2 mg via INTRAVENOUS

## 2019-12-14 MED ORDER — MAGNESIUM CITRATE PO SOLN: 1.0000 | Freq: Once | ORAL | Status: DC | PRN

## 2019-12-14 MED ORDER — BUPIVACAINE-EPINEPHRINE 0.5% -1:200000 IJ SOLN
INTRAMUSCULAR | Status: AC
  Filled 2019-12-14: qty 1

## 2019-12-14 MED ORDER — PROPOFOL 500 MG/50ML IV EMUL
INTRAVENOUS | Status: AC
  Filled 2019-12-14: qty 50

## 2019-12-14 MED ORDER — TRANEXAMIC ACID-NACL 1000-0.7 MG/100ML-% IV SOLN
1000.0000 mg | Freq: Once | INTRAVENOUS | Status: AC
  Administered 2019-12-14: 1000 mg via INTRAVENOUS
  Filled 2019-12-14: qty 100

## 2019-12-14 MED ORDER — CHLORHEXIDINE GLUCONATE 0.12 % MT SOLN
15.0000 mL | Freq: Once | OROMUCOSAL | Status: AC
  Administered 2019-12-14: 15 mL via OROMUCOSAL
  Filled 2019-12-14: qty 15

## 2019-12-14 MED ORDER — TRANEXAMIC ACID-NACL 1000-0.7 MG/100ML-% IV SOLN
INTRAVENOUS | Status: DC | PRN
Start: 2019-12-14 — End: 2019-12-14
  Administered 2019-12-14: 1000 mg via INTRAVENOUS

## 2019-12-14 MED ORDER — ONDANSETRON HCL 4 MG/2ML IJ SOLN: 4.0000 mg | Freq: Four times a day (QID) | INTRAMUSCULAR | Status: DC | PRN

## 2019-12-14 MED ORDER — MENTHOL 3 MG MT LOZG: 1.0000 | LOZENGE | OROMUCOSAL | Status: DC | PRN

## 2019-12-14 MED ORDER — PHENYLEPHRINE HCL-NACL 10-0.9 MG/250ML-% IV SOLN
INTRAVENOUS | Status: DC | PRN
  Administered 2019-12-14: 50 ug/min via INTRAVENOUS

## 2019-12-14 MED ORDER — PROPOFOL 500 MG/50ML IV EMUL
INTRAVENOUS | Status: DC | PRN
  Administered 2019-12-14: 75 ug/kg/min via INTRAVENOUS

## 2019-12-14 MED ORDER — OXYCODONE HCL 5 MG PO TABS
5.0000 mg | ORAL_TABLET | ORAL | Status: DC | PRN
  Administered 2019-12-14: 10 mg via ORAL
  Filled 2019-12-14: qty 2

## 2019-12-14 MED ORDER — ACETAMINOPHEN 325 MG PO TABS
650.0000 mg | ORAL_TABLET | ORAL | 2 refills | Status: DC | PRN
Start: 2019-12-14 — End: 2019-12-23

## 2019-12-14 MED ORDER — PHENYLEPHRINE 40 MCG/ML (10ML) SYRINGE FOR IV PUSH (FOR BLOOD PRESSURE SUPPORT)
PREFILLED_SYRINGE | INTRAVENOUS | Status: DC | PRN
  Administered 2019-12-14: 120 ug via INTRAVENOUS
  Administered 2019-12-14: 80 ug via INTRAVENOUS
  Administered 2019-12-14: 120 ug via INTRAVENOUS

## 2019-12-14 MED ORDER — PHENYLEPHRINE HCL (PRESSORS) 10 MG/ML IV SOLN
INTRAVENOUS | Status: AC
  Filled 2019-12-14: qty 2

## 2019-12-14 MED ORDER — BUPIVACAINE-EPINEPHRINE 0.5% -1:200000 IJ SOLN
INTRAMUSCULAR | Status: DC | PRN
  Administered 2019-12-14: 30 mL

## 2019-12-14 MED ORDER — SODIUM CHLORIDE 0.9 % IV SOLN: INTRAVENOUS | Status: DC

## 2019-12-14 MED ORDER — METHOCARBAMOL 500 MG PO TABS: 500.0000 mg | ORAL_TABLET | Freq: Three times a day (TID) | ORAL | 0 refills | Status: DC | PRN

## 2019-12-14 MED ORDER — ORAL CARE MOUTH RINSE: 15.0000 mL | Freq: Once | OROMUCOSAL | Status: AC

## 2019-12-14 MED ORDER — BUPIVACAINE IN DEXTROSE 0.75-8.25 % IT SOLN
INTRATHECAL | Status: DC | PRN
Start: 2019-12-14 — End: 2019-12-14
  Administered 2019-12-14: 2 mL via INTRATHECAL

## 2019-12-14 MED ORDER — MAGNESIUM HYDROXIDE 400 MG/5ML PO SUSP: 30.0000 mL | Freq: Every day | ORAL | Status: DC | PRN

## 2019-12-14 MED ORDER — PROPOFOL 10 MG/ML IV BOLUS
INTRAVENOUS | Status: DC | PRN
  Administered 2019-12-14 (×3): 20 mg via INTRAVENOUS

## 2019-12-14 MED ORDER — BISACODYL 10 MG RE SUPP: 10.0000 mg | Freq: Every day | RECTAL | Status: DC | PRN

## 2019-12-14 MED ORDER — DIPHENHYDRAMINE HCL 12.5 MG/5ML PO ELIX: 12.5000 mg | ORAL_SOLUTION | ORAL | Status: DC | PRN

## 2019-12-14 MED ORDER — ASPIRIN 81 MG PO CHEW
81.0000 mg | CHEWABLE_TABLET | Freq: Two times a day (BID) | ORAL | Status: DC
  Administered 2019-12-14 – 2019-12-15 (×2): 81 mg via ORAL
  Filled 2019-12-14 (×2): qty 1

## 2019-12-14 MED ORDER — ONDANSETRON HCL 4 MG PO TABS: 4.0000 mg | ORAL_TABLET | Freq: Four times a day (QID) | ORAL | Status: DC | PRN

## 2019-12-14 MED ORDER — SODIUM CHLORIDE 0.9 % IR SOLN
Status: DC | PRN
  Administered 2019-12-14: 1000 mL

## 2019-12-14 MED ORDER — HYDROMORPHONE HCL 2 MG PO TABS: 2.0000 mg | ORAL_TABLET | ORAL | 0 refills | Status: DC | PRN

## 2019-12-14 MED ORDER — METOCLOPRAMIDE HCL 5 MG/ML IJ SOLN: 5.0000 mg | Freq: Three times a day (TID) | INTRAMUSCULAR | Status: DC | PRN

## 2019-12-14 MED ORDER — OXYCODONE HCL 5 MG PO TABS
10.0000 mg | ORAL_TABLET | ORAL | Status: DC | PRN
  Administered 2019-12-15 (×2): 10 mg via ORAL
  Filled 2019-12-14 (×2): qty 2

## 2019-12-14 MED ORDER — ONDANSETRON HCL 4 MG/2ML IJ SOLN
INTRAMUSCULAR | Status: DC | PRN
  Administered 2019-12-14: 4 mg via INTRAVENOUS

## 2019-12-14 MED ORDER — SODIUM CHLORIDE 0.9 % IV SOLN
75.0000 mL/h | INTRAVENOUS | Status: DC
  Administered 2019-12-14: 75 mL/h via INTRAVENOUS

## 2019-12-14 MED ORDER — LACTATED RINGERS IV SOLN: INTRAVENOUS | Status: DC

## 2019-12-14 MED ORDER — ALUM & MAG HYDROXIDE-SIMETH 200-200-20 MG/5ML PO SUSP: 30.0000 mL | ORAL | Status: DC | PRN

## 2019-12-14 MED ORDER — MIDAZOLAM HCL 2 MG/2ML IJ SOLN
INTRAMUSCULAR | Status: AC
  Filled 2019-12-14: qty 2

## 2019-12-14 MED ORDER — ASPIRIN EC 81 MG PO TBEC: 81.0000 mg | DELAYED_RELEASE_TABLET | Freq: Two times a day (BID) | ORAL | 2 refills | Status: AC

## 2019-12-14 MED ORDER — 0.9 % SODIUM CHLORIDE (POUR BTL) OPTIME
TOPICAL | Status: DC | PRN
  Administered 2019-12-14: 1000 mL

## 2019-12-14 MED ORDER — METOCLOPRAMIDE HCL 5 MG PO TABS: 5.0000 mg | ORAL_TABLET | Freq: Three times a day (TID) | ORAL | Status: DC | PRN

## 2019-12-14 MED ORDER — HYDROMORPHONE HCL 1 MG/ML IJ SOLN
0.2500 mg | INTRAMUSCULAR | Status: DC | PRN
  Administered 2019-12-14 (×4): 0.5 mg via INTRAVENOUS

## 2019-12-14 MED ORDER — CEFAZOLIN SODIUM-DEXTROSE 2-4 GM/100ML-% IV SOLN
2.0000 g | INTRAVENOUS | Status: AC
  Administered 2019-12-14: 2 g via INTRAVENOUS
  Filled 2019-12-14: qty 100

## 2019-12-14 MED ORDER — TRANEXAMIC ACID-NACL 1000-0.7 MG/100ML-% IV SOLN
INTRAVENOUS | Status: AC
  Filled 2019-12-14: qty 100

## 2019-12-14 MED ORDER — ACETAMINOPHEN 500 MG PO TABS
1000.0000 mg | ORAL_TABLET | Freq: Four times a day (QID) | ORAL | Status: DC
  Administered 2019-12-14 – 2019-12-15 (×3): 1000 mg via ORAL
  Filled 2019-12-14 (×3): qty 2

## 2019-12-14 MED ORDER — STERILE WATER FOR IRRIGATION IR SOLN
Status: DC | PRN
  Administered 2019-12-14 (×2): 1000 mL

## 2019-12-14 MED ORDER — DOCUSATE SODIUM 100 MG PO CAPS
100.0000 mg | ORAL_CAPSULE | Freq: Two times a day (BID) | ORAL | Status: DC
  Administered 2019-12-14 – 2019-12-15 (×2): 100 mg via ORAL
  Filled 2019-12-14 (×2): qty 1

## 2019-12-14 MED ORDER — KETOROLAC TROMETHAMINE 15 MG/ML IJ SOLN
15.0000 mg | Freq: Four times a day (QID) | INTRAMUSCULAR | Status: DC
  Administered 2019-12-14 – 2019-12-15 (×3): 15 mg via INTRAVENOUS
  Filled 2019-12-14 (×3): qty 1

## 2019-12-14 MED ORDER — POVIDONE-IODINE 10 % EX SWAB
2.0000 "application " | Freq: Once | CUTANEOUS | Status: AC
  Administered 2019-12-14: 2 via TOPICAL

## 2019-12-14 MED ORDER — PHENOL 1.4 % MT LIQD: 1.0000 | OROMUCOSAL | Status: DC | PRN

## 2019-12-14 MED ORDER — HYDROMORPHONE HCL 1 MG/ML IJ SOLN: 0.5000 mg | INTRAMUSCULAR | Status: DC | PRN

## 2019-12-14 MED ORDER — PROPOFOL 1000 MG/100ML IV EMUL
INTRAVENOUS | Status: AC
  Filled 2019-12-14: qty 100

## 2019-12-14 MED ORDER — METHOCARBAMOL 1000 MG/10ML IJ SOLN
500.0000 mg | Freq: Four times a day (QID) | INTRAVENOUS | Status: DC | PRN
  Filled 2019-12-14: qty 5

## 2019-12-14 MED ORDER — METHOCARBAMOL 500 MG PO TABS
500.0000 mg | ORAL_TABLET | Freq: Four times a day (QID) | ORAL | Status: DC | PRN
  Administered 2019-12-14: 500 mg via ORAL
  Filled 2019-12-14: qty 1

## 2019-12-14 MED ORDER — TRANEXAMIC ACID 1000 MG/10ML IV SOLN
INTRAVENOUS | Status: DC | PRN
  Administered 2019-12-14: 2000 mg via TOPICAL

## 2019-12-14 MED ORDER — HYDROMORPHONE HCL 2 MG PO TABS: 2.0000 mg | ORAL_TABLET | ORAL | Status: DC | PRN

## 2019-12-14 MED ORDER — PHENYLEPHRINE 40 MCG/ML (10ML) SYRINGE FOR IV PUSH (FOR BLOOD PRESSURE SUPPORT)
PREFILLED_SYRINGE | INTRAVENOUS | Status: AC
  Filled 2019-12-14: qty 10

## 2019-12-14 MED ORDER — METHOCARBAMOL 500 MG PO TABS
ORAL_TABLET | ORAL | Status: AC
  Administered 2019-12-14: 500 mg
  Filled 2019-12-14: qty 1

## 2019-12-14 MED ORDER — ROPIVACAINE HCL 7.5 MG/ML IJ SOLN
INTRAMUSCULAR | Status: DC | PRN
  Administered 2019-12-14: 20 mL via PERINEURAL

## 2019-12-14 SURGICAL SUPPLY — 57 items
BAG DECANTER FOR FLEXI CONT (MISCELLANEOUS) ×2 IMPLANT
BAG SPEC THK2 15X12 ZIP CLS (MISCELLANEOUS) ×1
BAG ZIPLOCK 12X15 (MISCELLANEOUS) ×2 IMPLANT
BLADE SAGITTAL 25.0X1.19X90 (BLADE) ×2 IMPLANT
BOWL SMART MIX CTS (DISPOSABLE) ×2 IMPLANT
CEMENT HV SMART SET (Cement) ×4 IMPLANT
CEMENT TIBIA MBT SIZE 4 (Knees) IMPLANT
COMP FEM CEM STD+ RT LCS (Orthopedic Implant) ×2 IMPLANT
COMP PATELLA PEGX3 CEM STAN+ (Knees) ×2 IMPLANT
COMPONENT FEM CEM STD+ RT LCS (Orthopedic Implant) IMPLANT
COMPONENT PTLA PEGX3 CEM STAN+ (Knees) IMPLANT
COVER SURGICAL LIGHT HANDLE (MISCELLANEOUS) ×2 IMPLANT
COVER WAND RF STERILE (DRAPES) IMPLANT
CUFF TOURN SGL QUICK 34 (TOURNIQUET CUFF) ×2
CUFF TRNQT CYL 34X4.125X (TOURNIQUET CUFF) ×1 IMPLANT
DECANTER SPIKE VIAL GLASS SM (MISCELLANEOUS) ×2 IMPLANT
DRAPE IMP U-DRAPE 54X76 (DRAPES) ×2 IMPLANT
DRAPE SHEET LG 3/4 BI-LAMINATE (DRAPES) ×4 IMPLANT
DRESSING AQUACEL AG SP 3.5X10 (GAUZE/BANDAGES/DRESSINGS) IMPLANT
DRSG ADAPTIC 3X8 NADH LF (GAUZE/BANDAGES/DRESSINGS) ×1 IMPLANT
DRSG AQUACEL AG SP 3.5X10 (GAUZE/BANDAGES/DRESSINGS) ×2
DRSG PAD ABDOMINAL 8X10 ST (GAUZE/BANDAGES/DRESSINGS) ×1 IMPLANT
DURAPREP 26ML APPLICATOR (WOUND CARE) ×4 IMPLANT
ELECT REM PT RETURN 15FT ADLT (MISCELLANEOUS) ×2 IMPLANT
GAUZE SPONGE 4X4 12PLY STRL (GAUZE/BANDAGES/DRESSINGS) ×1 IMPLANT
GLOVE BIOGEL PI IND STRL 8 (GLOVE) ×1 IMPLANT
GLOVE BIOGEL PI IND STRL 8.5 (GLOVE) ×1 IMPLANT
GLOVE BIOGEL PI INDICATOR 8 (GLOVE) ×1
GLOVE BIOGEL PI INDICATOR 8.5 (GLOVE) ×1
GLOVE ECLIPSE 8.0 STRL XLNG CF (GLOVE) ×4 IMPLANT
GLOVE ECLIPSE 8.5 STRL (GLOVE) ×4 IMPLANT
GOWN STRL REUS W/ TWL LRG LVL3 (GOWN DISPOSABLE) ×1 IMPLANT
GOWN STRL REUS W/TWL 2XL LVL3 (GOWN DISPOSABLE) ×2 IMPLANT
GOWN STRL REUS W/TWL LRG LVL3 (GOWN DISPOSABLE) ×2
HANDPIECE INTERPULSE COAX TIP (DISPOSABLE) ×2
HOLDER FOLEY CATH W/STRAP (MISCELLANEOUS) IMPLANT
INSERT TIB LCS RP STD+ 12.5 (Knees) ×1 IMPLANT
KIT TURNOVER KIT A (KITS) IMPLANT
MANIFOLD NEPTUNE II (INSTRUMENTS) ×2 IMPLANT
NS IRRIG 1000ML POUR BTL (IV SOLUTION) ×2 IMPLANT
PACK TOTAL KNEE CUSTOM (KITS) ×2 IMPLANT
PADDING CAST COTTON 6X4 STRL (CAST SUPPLIES) ×3 IMPLANT
PENCIL SMOKE EVACUATOR (MISCELLANEOUS) IMPLANT
PIN STEINMAN FIXATION KNEE (PIN) ×1 IMPLANT
PROTECTOR NERVE ULNAR (MISCELLANEOUS) ×2 IMPLANT
SET HNDPC FAN SPRY TIP SCT (DISPOSABLE) ×1 IMPLANT
STAPLER VISISTAT 35W (STAPLE) ×2 IMPLANT
SUT BONE WAX W31G (SUTURE) ×2 IMPLANT
SUT ETHIBOND NAB CT1 #1 30IN (SUTURE) ×4 IMPLANT
SUT MNCRL AB 3-0 PS2 18 (SUTURE) ×2 IMPLANT
SUT VIC AB 2-0 PS2 27 (SUTURE) ×2 IMPLANT
TIBIA MBT CEMENT SIZE 4 (Knees) ×2 IMPLANT
TRAY FOLEY MTR SLVR 16FR STAT (SET/KITS/TRAYS/PACK) ×2 IMPLANT
UNDERPAD 30X36 HEAVY ABSORB (UNDERPADS AND DIAPERS) ×2 IMPLANT
WATER STERILE IRR 1000ML POUR (IV SOLUTION) ×4 IMPLANT
WRAP KNEE MAXI GEL POST OP (GAUZE/BANDAGES/DRESSINGS) ×2 IMPLANT
YANKAUER SUCT BULB TIP 10FT TU (MISCELLANEOUS) ×2 IMPLANT

## 2019-12-14 NOTE — Plan of Care (Signed)

## 2019-12-14 NOTE — Anesthesia Procedure Notes (Signed)
Anesthesia Regional Block: Adductor canal block   Pre-Anesthetic Checklist: ,, timeout performed, Correct Patient, Correct Site, Correct Laterality, Correct Procedure, Correct Position, site marked, Risks and benefits discussed,  Surgical consent,  Pre-op evaluation,  At surgeon's request and post-op pain management  Laterality: Right  Prep: chloraprep       Needles:  Injection technique: Single-shot  Needle Type: Echogenic Stimulator Needle     Needle Length: 9cm  Needle Gauge: 21     Additional Needles:   Narrative:  Start time: 12/14/2019 7:05 AM End time: 12/14/2019 7:15 AM Injection made incrementally with aspirations every 5 mL.  Performed by: Personally  Anesthesiologist: Arta Bruce, MD  Additional Notes: Monitors applied. Patient sedated. Sterile prep and drape,hand hygiene and sterile gloves were used. Relevant anatomy identified.Needle position confirmed.Local anesthetic injected incrementally after negative aspiration. Local anesthetic spread visualized around nerve(s). Vascular puncture avoided. No complications. Image printed for medical record.The patient tolerated the procedure well.    Arta Bruce MD

## 2019-12-14 NOTE — Progress Notes (Signed)
Orthopedic Tech Progress Note Patient Details:  Julia [name] 15-Nov-1965 [id number] Spoke with RN and she said patient is in a lot of pain. We discussed putting patient in CPM in the morning.  Patient ID: [name], female   DOB: 1965-12-23, 54 y.o.   MRN: [id number]   Ancil Linsey 12/14/2019, 6:58 PM

## 2019-12-14 NOTE — Op Note (Signed)
PATIENT ID:      Julia [name]  MRN:     [id number] DOB/AGE:    May 28, 1966 / 55 y.o.       OPERATIVE REPORT    DATE OF PROCEDURE:  12/14/2019       PREOPERATIVE DIAGNOSIS: end stage  right knee osteoarthritis                                                       Estimated body mass index is 39.39 kg/m as calculated from the following:   Height as of this encounter: 5\' 8"  (1.727 m).   Weight as of this encounter: 117.5 kg.     POSTOPERATIVE DIAGNOSIS: end stage  right knee osteoarthritis                                                                     Estimated body mass index is 39.39 kg/m as calculated from the following:   Height as of this encounter: 5\' 8"  (1.727 m).   Weight as of this encounter: 117.5 kg.     PROCEDURE:  Procedure(s): RIGHT TOTAL KNEE ARTHROPLASTY      SURGEON:  , MD    ASSISTANT:   , PA-C   (Present and scrubbed throughout the case, critical for assistance with exposure, retraction, instrumentation, and closure.)          ANESTHESIA: regional, spinal and IV sedation     DRAINS: none :      TOURNIQUET TIME:  Total Tourniquet Time Documented: Thigh (Right) - 62 minutes Total: Thigh (Right) - 62 minutes     COMPLICATIONS:  None   CONDITION:  stable  PROCEDURE IN DETAIL: [name]   [name]  12/14/2019, 9:26 AM

## 2019-12-14 NOTE — Anesthesia Postprocedure Evaluation (Signed)
Anesthesia Post Note  Patient: Julia [name]  Procedure(s) Performed: RIGHT TOTAL KNEE ARTHROPLASTY (Right Knee)     Patient location during evaluation: PACU Anesthesia Type: Spinal Level of consciousness: oriented and awake and alert Pain management: pain level controlled Vital Signs Assessment: post-procedure vital signs reviewed and stable Respiratory status: spontaneous breathing, respiratory function stable and patient connected to nasal cannula oxygen Cardiovascular status: blood pressure returned to baseline and stable Postop Assessment: no headache, no backache and no apparent nausea or vomiting Anesthetic complications: no    Last Vitals:  Vitals:   12/14/19 1115 12/14/19 1130  BP:  (!) 103/56  Pulse:  (!) 53  Resp:  18  Temp: 36.6 C 36.6 C  SpO2:  98%    Last Pain:  Vitals:   12/14/19 1130  TempSrc:   PainSc: 0-No pain                 Ryenn Howeth DAVID

## 2019-12-14 NOTE — H&P (Signed)
The recent History & Physical has been reviewed. I have personally examined the patient today. There is no interval change to the documented History & Physical. The patient would like to proceed with the procedure.  Valeria Batman 12/14/2019,  7:18 AM

## 2019-12-14 NOTE — Op Note (Signed)
Julia [name], Julia [name] MEDICAL RECORD [id number] ACCOUNT [id number] DATE OF BIRTH:May 28, 1966 FACILITY: [facility] LOCATION: [facility] PHYSICIAN:Jefry Lesinski Sharlotte Alamo, MD  OPERATIVE REPORT  DATE OF PROCEDURE:  12/14/2019  PREOPERATIVE DIAGNOSIS:  End-stage osteoarthritis, right knee.  POSTOPERATIVE DIAGNOSIS:  End-stage osteoarthritis, right knee.  PROCEDURE:  Right total knee replacement.  SURGEON:  Joni Fears, MD  ASSISTANT:  Biagio Borg, PA-C  ANESTHESIA:  Spinal with adductor canal block and IV sedation.  COMPLICATIONS:  None.  COMPONENTS:  DePuy LCS standard plus femoral component, a #4 rotating keeled tibial tray with a 12.5 mm bridging bearing, a metal backed 3 peg rotating patella.  Components were secured with polymethyl methacrylate.  DESCRIPTION OF PROCEDURE:  The patient was met in the holding area and identified the right knee as the appropriate operative site and marked it accordingly.  Any questions were answered.  Anesthesia performed an adductor canal block.  The patient was then transported to the recovery room #6.  Spinal anesthetic was performed by anesthesia without difficulty.  The patient was then placed comfortably on the operating table.  Nursing staff inserted a Foley catheter.  Urine was clear.  The right lower extremity, which had been appropriately marked preoperatively, was then placed in a thigh tourniquet.  The leg was then prepared with chlorhexidine scrub and then DuraPrep times 2 from the tourniquet to the tips of the toes.  Sterile  draping was performed.  After waiting 3 minutes for the DuraPrep to dry, the leg was elevated, it was Esmarch exsanguinated with a proximal tourniquet at 325 mmHg.  A midline longitudinal incision was then made centered about the patella extending from the superior pouch to the tibial tubercle.  Via sharp dissection, the 1st layer of capsule was incised.  A medial parapatellar incision was then made with the Bovie.    The joint was entered.  There was about 30 mL of clear yellow joint effusion.  Patella was everted 180 degrees and knee flexed to 90 degrees.  There was a moderate amount of beefy red synovitis.  We did send a specimen to the lab.  I did a synovectomy.   I removed osteophytes that were moderately large along the medial and lateral femoral condyle and measured a standard plus femoral component.  First bony cut was then made transversely in the proximal tibia with a 7 degree angle of declination using the external guide.  After each bony cut on both the femur and the tibia, we checked our alignment with the external guide.  Subsequent cuts were then made on the femur using the standard plus femoral jig.  Laminar spreaders were then inserted along the medial and lateral compartment.  I removed medial and lateral menisci as well as ACL and PCL.  Osteophytes were removed from  the posterior femoral condyle with a 3/4 inch curved osteotome.  Flexion and extension gaps were perfectly symmetrical at 12.5 mm.  MCL and LCL remained intact throughout the procedure.  There was no significant varus or valgus identified with essentially normal alignment preoperatively.  Retractors were then placed around the tibia, was advanced anteriorly.  The tibia was then measured with a #4 tibial tray.  This was pinned in place.  Center hole was then made by the keeled cut.  With the tibial jig in place, the 12.5 mm bridging  bearing was then applied.  The trial femoral component was then applied after the appropriate cuts had been made before cutting the tibia.  This fit perfectly.  The entire  construct was reduced and through a full range of motion remained perfectly  stable.  There was no opening with varus or valgus stress, no malrotation of the tibial component.  Patella was prepared, removing about 8 mm of bone, leaving 12.5 mm patella thickness.  Three holes were then made with the patellar jig and the patella applied  and placed through a full range of motion without instability.  The trial components were then removed.  The joint was copiously irrigated with saline solution.  The knee was placed in 90 degrees and retractor was placed around the tibia.  The final #4 rotating tibial tray was then impacted with polymethyl methacrylate.  Any extraneous methacrylate was removed at periphery of the component.  The 12.5 mm final  polyethylene component was then applied followed by the cemented standard plus femoral component.  Any further extraneous methacrylate about the femur and the tibia was removed.  Patella was prepared by applying the patella with cement and a patellar clamp.  At approximately 16 minutes methacrylate had matured.  During this time, we performed a more detailed synovectomy, irrigated the joint, and then injected the deep capsule with 0.25% Marcaine with epinephrine.  Tourniquet was deflated at 62 minutes.   There was minimal bleeding, which was controlled with the Bovie.  The patient did receive a gram of TXA IV preoperatively.  Deep capsule was then closed with a running #1 Ethibond, superficial capsule with a 2-0 Vicryl, subcutaneous with 3-0 Monocryl,  skin closed with skin clips.  Sterile bulky dressing was applied followed by an Aquacel dressing.  PLAN:  Dilaudid for pain.  Either sent home this afternoon from recovery area in the morning depending upon her progress.  CN/NUANCE  D:12/14/2019 T:12/14/2019 JOB:[id number]

## 2019-12-14 NOTE — Progress Notes (Signed)
Orthopedic Tech Progress Note Patient Details:  Julia [name] Mar 03, 1966 [id number]  Patient ID: Criss Alvine, female   DOB: 08-25-1965, 54 y.o.   MRN: [id number]   Kizzie Fantasia 12/14/2019, 7:15 PM Pt unable to use cpm and declined usage bc of pain level. Also pt became very anxious and tearful.

## 2019-12-14 NOTE — Anesthesia Pain Management Evaluation Note (Signed)
Per DR. Ossey, It is ok to give Dilaudid IV (Phase 1 order) in Phase 2. Verbal consent given

## 2019-12-14 NOTE — Transfer of Care (Signed)
Immediate Anesthesia Transfer of Care Note  Patient: Julia [name]  Procedure(s) Performed: RIGHT TOTAL KNEE ARTHROPLASTY (Right Knee)  Patient Location: PACU  Anesthesia Type:Spinal  Level of Consciousness: awake, alert , oriented and patient cooperative  Airway & Oxygen Therapy: Patient Spontanous Breathing and Patient connected to face mask  Post-op Assessment: Report given to RN and Post -op Vital signs reviewed and stable  Post vital signs: Reviewed and stable  Last Vitals:  Vitals Value Taken Time  BP 96/46 12/14/19 0941  Temp    Pulse 85 12/14/19 0945  Resp 16 12/14/19 0945  SpO2 96 % 12/14/19 0945  Vitals shown include unvalidated device data.  Last Pain:  Vitals:   12/14/19 0629  TempSrc: Oral  PainSc:       Patients Stated Pain Goal: 3 (12/14/19 0559)  Complications: No apparent anesthesia complications

## 2019-12-14 NOTE — Discharge Summary (Addendum)
Julia Killings, Julia [name]   Julia Code, Julia [name] [address] [place], [place]  [address]                             [phone]  PATIENT ID: JERLYN PAIN        MRN:  [id number]          DOB/AGE: 1966-01-05 / 54 y.o.    DISCHARGE SUMMARY  ADMISSION DATE:    12/14/2019 DISCHARGE DATE:   12/15/2019   ADMISSION DIAGNOSIS: Total knee replacement status, right [Z96.651]    DISCHARGE DIAGNOSIS:  right knee osteoarthritis    ADDITIONAL DIAGNOSIS: Active Problems:   Total knee replacement status, right  Past Medical History:  Diagnosis Date  . Anxiety   . Arthritis    knees, hands  . Depression     PROCEDURE: Procedure(s): RIGHT TOTAL KNEE ARTHROPLASTY  on 12/14/2019  CONSULTS: none    HISTORY: Julia [name], 54 y.o. female, has a history of pain and functional disability in the right knee due to arthritis and has failed non-surgical conservative treatments for greater than 12 weeks to includeNSAID's and/or analgesics, corticosteriod injections, viscosupplementation injections, flexibility and strengthening excercises, weight reduction as appropriate and activity modification.  Onset of symptoms was gradual, starting 5 years ago with rapidlly worsening course since that time. The patient noted prior procedures on the knee to include  arthroscopy on the right knee(s).  Patient currently rates pain in the right knee(s) at 9 out of 10 with activity. Patient has night pain, worsening of pain with activity and weight bearing, pain that interferes with activities of daily living, crepitus and joint swelling.  Patient has evidence of subchondral cysts, subchondral sclerosis, periarticular osteophytes and joint space narrowing by imaging studies.  There is no active infection.  HOSPITAL COURSE:  Julia [name] is a 54 y.o. admitted on 12/14/2019 and found to have a diagnosis of right knee osteoarthritis.  After appropriate laboratory studies were obtained  they were taken to the operating  room on 12/14/2019 and underwent  Procedure(s): RIGHT TOTAL KNEE ARTHROPLASTY  .   They were given perioperative antibiotics:  Anti-infectives (From admission, onward)   Start     Dose/Rate Route Frequency Ordered Stop   12/14/19 1800  ceFAZolin (ANCEF) IVPB 2g/100 mL premix     2 g 200 mL/hr over 30 Minutes Intravenous Every 6 hours 12/14/19 1742 12/15/19 0020   12/14/19 0600  ceFAZolin (ANCEF) IVPB 2g/100 mL premix     2 g 200 mL/hr over 30 Minutes Intravenous On call to O.R. 12/14/19 4403 12/14/19 0748    .  Tolerated the procedure well.  Placed with a foley intraoperatively.    Toradol was given post op.  POD #1, Foley and IV discontinued.  PT continued. .  The remainder of the hospital course was dedicated to ambulation and strengthening.   The patient was discharged on 1 Day Post-Op in  Stable condition.  Blood products given:none  DIAGNOSTIC STUDIES: Recent vital signs:  Patient Vitals for the past 24 hrs:  BP Temp Temp src Pulse Resp SpO2  12/15/19 0533 (!) 110/56 97.7 F (36.5 C) Oral (!) 52 18 99 %  12/15/19 0126 116/63 (!) 97.5 F (36.4 C) -- 68 18 99 %  12/14/19 2151 -- -- -- -- -- 94 %  12/14/19 2150 (!) 108/58 98.3 F (36.8 C) Oral 63 18 (!) 89 %  12/14/19 1845 (!) 147/82  98.5 F (36.9 C) Oral 75 16 95 %  12/14/19 1744 (!) 154/94 98 F (36.7 C) -- (!) 57 16 95 %  12/14/19 1531 (!) 152/89 -- -- -- -- --  12/14/19 1130 (!) 103/56 97.8 F (36.6 C) -- (!) 53 18 98 %  12/14/19 1115 -- 97.9 F (36.6 C) -- -- -- --  12/14/19 1100 (!) 82/70 -- -- (!) 51 13 98 %  12/14/19 1045 102/67 98 F (36.7 C) -- 62 18 96 %  12/14/19 1030 95/60 -- -- 62 16 95 %  12/14/19 1015 101/64 -- -- 64 18 94 %  12/14/19 1001 (!) 92/47 -- -- 70 (!) 24 96 %  12/14/19 0945 (!) 94/58 -- -- 85 16 96 %  12/14/19 0941 (!) 96/46 97.7 F (36.5 C) -- 85 (!) 21 (!) 87 %       Recent laboratory studies: Recent Labs    12/15/19 0325  WBC 5.8  HGB 12.3  HCT 37.5  PLT 191   Recent Labs     12/15/19 0325  NA 139  K 4.1  CL 103  CO2 26  BUN 13  CREATININE 0.80  GLUCOSE 118*  CALCIUM 8.2*   Lab Results  Component Value Date   INR 1.0 12/03/2019     Recent Radiographic Studies :  DG Chest 2 View  Result Date: 12/03/2019 CLINICAL DATA:  Preop evaluation for upcoming knee surgery EXAM: CHEST - 2 VIEW COMPARISON:  None. FINDINGS: The heart size and mediastinal contours are within normal limits. Both lungs are clear. The visualized skeletal structures are unremarkable. IMPRESSION: No active cardiopulmonary disease. Electronically Signed   By: Alcide Clever M.D.   On: 12/03/2019 14:24    DISCHARGE INSTRUCTIONS: Discharge Instructions    CPM   Complete by: As directed    Continuous passive motion machine (CPM):      Use the CPM from 0 to 60 for 6-8 hours per day.      You may increase by 5-10 degrees per day.  You may break it up into 2 or 3 sessions per day.      Use CPM for 3-4  weeks or until you are told to stop.   Call Julia [name] / Call 911   Complete by: As directed    If you experience chest pain or shortness of breath, CALL 911 and be transported to the hospital emergency room.  If you develope a fever above 101 F, pus (white drainage) or increased drainage or redness at the wound, or calf pain, call your surgeon's office.   Change dressing   Complete by: As directed    DO NOT CHANGE YOUR DRESSING   Constipation Prevention   Complete by: As directed    Drink plenty of fluids.  Prune juice may be helpful.  You may use a stool softener, such as Colace (over the counter) 100 mg twice a day.  Use MiraLax (over the counter) for constipation as needed.   Diet general   Complete by: As directed    Discharge instructions   Complete by: As directed    INSTRUCTIONS AFTER JOINT REPLACEMENT   Remove items at home which could result in a fall. This includes throw rugs or furniture in walking pathways ICE to the affected joint every three hours while awake for 30 minutes at a time,  for at least the first 3-5 days, and then as needed for pain and swelling.  Continue to use ice for pain and  swelling. You may notice swelling that will progress down to the foot and ankle.  This is normal after surgery.  Elevate your leg when you are not up walking on it.   Continue to use the breathing machine you got in the hospital (incentive spirometer) which will help keep your temperature down.  It is common for your temperature to cycle up and down following surgery, especially at night when you are not up moving around and exerting yourself.  The breathing machine keeps your lungs expanded and your temperature down.   DIET:  As you were doing prior to hospitalization, we recommend a well-balanced diet.  DRESSING / WOUND CARE / SHOWERING  Keep the surgical dressing until follow up.  The dressing is water proof, so you can shower without any extra covering.  IF THE DRESSING FALLS OFF or the wound gets wet inside, change the dressing with sterile gauze.  Please use good hand washing techniques before changing the dressing.  Do not use any lotions or creams on the incision until instructed by your surgeon.    ACTIVITY  Increase activity slowly as tolerated, but follow the weight bearing instructions below.   No driving for 6 weeks or until further direction given by your physician.  You cannot drive while taking narcotics.  No lifting or carrying greater than 10 lbs. until further directed by your surgeon. Avoid periods of inactivity such as sitting longer than an hour when not asleep. This helps prevent blood clots.  You may return to work once you are authorized by your doctor.     WEIGHT BEARING   Partial weight bearing with assist device as directed.  50%   EXERCISES  Results after joint replacement surgery are often greatly improved when you follow the exercise, range of motion and muscle strengthening exercises prescribed by your doctor. Safety measures are also important to  protect the joint from further injury. Any time any of these exercises cause you to have increased pain or swelling, decrease what you are doing until you are comfortable again and then slowly increase them. If you have problems or questions, call your caregiver or physical therapist for advice.   Rehabilitation is important following a joint replacement. After just a few days of immobilization, the muscles of the leg can become weakened and shrink (atrophy).  These exercises are designed to build up the tone and strength of the thigh and leg muscles and to improve motion. Often times heat used for twenty to thirty minutes before working out will loosen up your tissues and help with improving the range of motion but do not use heat for the first two weeks following surgery (sometimes heat can increase post-operative swelling).   These exercises can be done on a training (exercise) mat, on the floor, on a table or on a bed. Use whatever works the best and is most comfortable for you.    Use music or television while you are exercising so that the exercises are a pleasant break in your day. This will make your life better with the exercises acting as a break in your routine that you can look forward to.   Perform all exercises about fifteen times, three times per day or as directed.  You should exercise both the operative leg and the other leg as well.   Exercises include:  Quad Sets - Tighten up the muscle on the front of the thigh (Quad) and hold for 5-10 seconds.   Straight Leg Raises -  With your knee straight (if you were given a brace, keep it on), lift the leg to 60 degrees, hold for 3 seconds, and slowly lower the leg.  Perform this exercise against resistance later as your leg gets stronger.  Leg Slides: Lying on your back, slowly slide your foot toward your buttocks, bending your knee up off the floor (only go as far as is comfortable). Then slowly slide your foot back down until your leg is flat on  the floor again.  Angel Wings: Lying on your back spread your legs to the side as far apart as you can without causing discomfort.  Hamstring Strength:  Lying on your back, push your heel against the floor with your leg straight by tightening up the muscles of your buttocks.  Repeat, but this time bend your knee to a comfortable angle, and push your heel against the floor.  You may put a pillow under the heel to make it more comfortable if necessary.   A rehabilitation program following joint replacement surgery can speed recovery and prevent re-injury in the future due to weakened muscles. Contact your doctor or a physical therapist for more information on knee rehabilitation.    CONSTIPATION  Constipation is defined medically as fewer than three stools per week and severe constipation as less than one stool per week.  Even if you have a regular bowel pattern at home, your normal regimen is likely to be disrupted due to multiple reasons following surgery.  Combination of anesthesia, postoperative narcotics, change in appetite and fluid intake all can affect your bowels.   YOU MUST use at least one of the following options; they are listed in order of increasing strength to get the job done.  They are all available over the counter, and you may need to use some, POSSIBLY even all of these options:    Drink plenty of fluids (prune juice may be helpful) and high fiber foods Colace 100 mg by mouth twice a day  Senokot for constipation as directed and as needed Dulcolax (bisacodyl), take with full glass of water  Miralax (polyethylene glycol) once or twice a day as needed.  If you have tried all these things and are unable to have a bowel movement in the first 3-4 days after surgery call either your surgeon or your primary doctor.    If you experience loose stools or diarrhea, hold the medications until you stool forms back up.  If your symptoms do not get better within 1 week or if they get worse,  check with your doctor.  If you experience "the worst abdominal pain ever" or develop nausea or vomiting, please contact the office immediately for further recommendations for treatment.   ITCHING:  If you experience itching with your medications, try taking only a single pain pill, or even half a pain pill at a time.  You can also use Benadryl over the counter for itching or also to help with sleep.   TED HOSE STOCKINGS:  Use stockings on both legs until for at least 2 weeks or as directed by physician office. They may be removed at night for sleeping.  MEDICATIONS:  See your medication summary on the "After Visit Summary" that nursing will review with you.  You may have some home medications which will be placed on hold until you complete the course of blood thinner medication.  It is important for you to complete the blood thinner medication as prescribed.  PRECAUTIONS:  If you experience chest  pain or shortness of breath - call 911 immediately for transfer to the hospital emergency department.   If you develop a fever greater that 101 F, purulent drainage from wound, increased redness or drainage from wound, foul odor from the wound/dressing, or calf pain - CONTACT YOUR SURGEON.                                                   FOLLOW-UP APPOINTMENTS:  If you do not already have a post-op appointment, please call the office for an appointment to be seen by your surgeon.  Guidelines for how soon to be seen are listed in your "After Visit Summary", but are typically between 1-4 weeks after surgery.  OTHER INSTRUCTIONS:   Knee Replacement:  Do not place pillow under knee, focus on keeping the knee straight while resting. CPM instructions: 0-90 degrees, 2 hours in the morning, 2 hours in the afternoon, and 2 hours in the evening. Place foam block, curve side up under heel at all times except when in CPM or when walking.  DO NOT modify, tear, cut, or change the foam block in any way.  MAKE SURE  YOU:  Understand these instructions.  Get help right away if you are not doing well or get worse.    Thank you for letting us be a part of your medical care team.  It is a privilege we respect greatly.  We hope these instructions will help you stay on track for a fast and full recovery!   Do not put a pillow under the knee. Place it under the heel.   Complete by: As directed    Driving restrictions   Complete by: As directed    No driving for 6 weeks   Increase activity slowly as tolerated   Complete by: As directed    Lifting restrictions   Complete by: As directed    No lifting for 6 weeks   Patient may shower   Complete by: As directed    You may shower over the brown dressing   TED hose   Complete by: As directed    Use stockings (TED hose) for 2-3 weeks on right leg.  You may remove them at night for sleeping.   Weight bearing as tolerated   Complete by: As directed       DISCHARGE MEDICATIONS:   Allergies as of 12/15/2019   No Known Allergies     Medication List    STOP taking these medications   ibuprofen 200 MG tablet Commonly known as: ADVIL     TAKE these medications   acetaminophen 325 MG tablet Commonly known as: Tylenol Take 2 tablets (650 mg total) by mouth every 4 (four) hours as needed.   aspirin EC 81 MG tablet Take 1 tablet (81 mg total) by mouth 2 (two) times daily.   clonazePAM 1 MG tablet Commonly known as: KLONOPIN Take 1 mg by mouth 2 (two) times daily as needed for anxiety.   HYDROmorphone 2 MG tablet Commonly known as: Dilaudid Take 1 tablet (2 mg total) by mouth every 4 (four) hours as needed for up to 7 days for moderate pain or severe pain.   lisinopril-hydrochlorothiazide 10-12.5 MG tablet Commonly known as: ZESTORETIC Start 1/2 tab PO qd, may increase to 1 PO qd if needed for BP control.  methocarbamol 500 MG tablet Commonly known as: Robaxin Take 1 tablet (500 mg total) by mouth every 8 (eight) hours as needed for muscle  spasms.   multivitamin with minerals tablet Take 1 tablet by mouth daily. Chewable   Viibryd 20 MG Tabs Generic drug: Vilazodone HCl Take 20 mg by mouth daily.   vitamin B-12 1000 MCG tablet Commonly known as: CYANOCOBALAMIN Take 1,000 mcg by mouth daily. Chewable   zolpidem 10 MG tablet Commonly known as: AMBIEN Take 10 mg by mouth at bedtime as needed for sleep.            Durable Medical Equipment  (From admission, onward)         Start     Ordered   12/14/19 1742  DME Walker rolling  Once    Question:  Patient needs a walker to treat with the following condition  Answer:  S/P TKR (total knee replacement) using cement, right   12/14/19 1742   12/14/19 1742  DME 3 n 1  Once     12/14/19 1742   12/14/19 1742  DME Bedside commode  Once    Question:  Patient needs a bedside commode to treat with the following condition  Answer:  Total knee replacement status, right   12/14/19 1742           Discharge Care Instructions  (From admission, onward)         Start     Ordered   12/14/19 0000  Change dressing    Comments: DO NOT CHANGE YOUR DRESSING   12/14/19 1001   12/14/19 0000  Weight bearing as tolerated     12/14/19 1001          FOLLOW UP VISIT:  12/28/2019  DISPOSITION:   Home  CONDITION:  Stable   Arlys John D. Aleda Grana [facility] [phone]  12/15/2019 8:35 AM

## 2019-12-14 NOTE — Anesthesia Preprocedure Evaluation (Signed)
Anesthesia Evaluation  Patient identified by MRN, date of birth, ID band Patient awake    Reviewed: Allergy & Precautions, NPO status , Patient's Chart, lab work & pertinent test results  Airway Mallampati: I  TM Distance: >3 FB Neck ROM: Full    Dental   Pulmonary former smoker,    Pulmonary exam normal        Cardiovascular Normal cardiovascular exam     Neuro/Psych Anxiety Depression    GI/Hepatic   Endo/Other    Renal/GU      Musculoskeletal   Abdominal   Peds  Hematology   Anesthesia Other Findings   Reproductive/Obstetrics                             Anesthesia Physical Anesthesia Plan  ASA: II  Anesthesia Plan: Spinal   Post-op Pain Management:  Regional for Post-op pain   Induction: Intravenous  PONV Risk Score and Plan: 2 and Ondansetron and Treatment may vary due to age or medical condition  Airway Management Planned: Nasal Cannula  Additional Equipment:   Intra-op Plan:   Post-operative Plan:   Informed Consent: I have reviewed the patients History and Physical, chart, labs and discussed the procedure including the risks, benefits and alternatives for the proposed anesthesia with the patient or authorized representative who has indicated his/her understanding and acceptance.       Plan Discussed with: CRNA and Surgeon  Anesthesia Plan Comments:         Anesthesia Quick Evaluation

## 2019-12-14 NOTE — Evaluation (Addendum)
Physical Therapy Evaluation Patient Details Name: Julia [name] MRN: [id number] DOB: 1966/02/15 Today's Date: 12/14/2019   History of Present Illness  Patient is 54 y.o. female s/p Rt TKA on 12/14/19 with PMH significant for OA, depression, anxiety.  Clinical Impression  Julia [name] is a 54 y.o. female POD 0 s/p Rt TKA. Patient reports independence with mobility at baseline. Patient is now limited by functional impairments (see PT problem list below) and requires min assist for transfers and gait with RW. Patient was able to ambulate ~50 feet with RW and min assist. Patient had antalgic gait pattern and reports 9/10 pain with mobility and at rest. Patient instructed in exercise to facilitate circulation. Time spent educating on pain management technique with ice and educated on pain expectations during acute recovery. Discussed concerns regarding safety with stair mobility with pt and spouse and benefits of additional therapy and acute management to improve safety and independence with mobility. Patient will benefit from continued skilled PT interventions to address impairments and progress towards PLOF. Acute PT will follow to progress mobility and stair training in preparation for safe discharge home.     Follow Up Recommendations Follow surgeon's recommendation for DC plan and follow-up therapies;Home health PT    Equipment Recommendations  None recommended by PT    Recommendations for Other Services       Precautions / Restrictions Precautions Precautions: Fall Restrictions Weight Bearing Restrictions: No Other Position/Activity Restrictions: WBAT      Mobility  Bed Mobility Overal bed mobility: Needs Assistance Bed Mobility: Supine to Sit;Sit to Supine     Supine to sit: Min assist Sit to supine: Min assist   General bed mobility comments: cues for sequencing LE mobility to sit up EOB. assist required to mobilize Rt LE and fully raise trunk.   Transfers Overall transfer  level: Needs assistance Equipment used: Rolling walker (2 wheeled) Transfers: Sit to/from Stand Sit to Stand: Min assist;From elevated surface         General transfer comment: cues for hand placement and technique for power up and assist required to complete and steady with rising.  Ambulation/Gait Ambulation/Gait assistance: Min assist Gait Distance (Feet): 50 Feet Assistive device: Rolling walker (2 wheeled) Gait Pattern/deviations: Step-to pattern;Decreased step length - left;Decreased stance time - right;Decreased weight shift to right Gait velocity: decreased   General Gait Details: cues for safe step pattern and proximity to RW. assist to guard Rt knee initialy and to steady. Assist required for walker positioning to negotiate turns.   Stairs         Wheelchair Mobility    Modified Rankin (Stroke Patients Only)       Balance Overall balance assessment: Needs assistance Sitting-balance support: Feet supported Sitting balance-Leahy Scale: Fair     Standing balance support: During functional activity;Bilateral upper extremity supported Standing balance-Leahy Scale: Poor              Pertinent Vitals/Pain Pain Assessment: 0-10 Pain Score: 9  Pain Location: Rt knee Pain Descriptors / Indicators: Aching;Crying;Discomfort;Grimacing;Guarding Pain Intervention(s): Limited activity within patient's tolerance;Monitored during session;Repositioned;Ice applied    Home Living Family/patient expects to be discharged to:: Private residence Living Arrangements: Spouse/significant other Available Help at Discharge: Family Type of Home: House Home Access: Stairs to enter Entrance Stairs-Rails: None Entrance Stairs-Number of Steps: 3x1  or 3 together Home Layout: Two level;Full bath on main level;Able to live on main level with bedroom/bathroom;Bed/bath upstairs Home Equipment: Walker - 2 wheels;Shower seat;Bedside commode Additional Comments: pt  is going to stay on  main level    Prior Function Level of Independence: Independent   Gait / Transfers Assistance Needed: pt wearing kne braces for mobility but not using AD  ADL's / Homemaking Assistance Needed: needed help with shoes/socks        Hand Dominance   Dominant Hand: Left    Extremity/Trunk Assessment   Upper Extremity Assessment Upper Extremity Assessment: Overall WFL for tasks assessed    Lower Extremity Assessment Lower Extremity Assessment: RLE deficits/detail RLE Deficits / Details: good quad activation, limited by pain. 3/5.  RLE: Unable to fully assess due to pain RLE Sensation: WNL RLE Coordination: WNL    Cervical / Trunk Assessment Cervical / Trunk Assessment: Normal  Communication   Communication: No difficulties  Cognition Arousal/Alertness: Awake/alert Behavior During Therapy: (distressed but with emotional support improved) Overall Cognitive Status: Within Functional Limits for tasks assessed          General Comments: pt tearful and distresed over pain level and goal today to go home. pt reports she did not initially plan for this but is very disspointed this may not happen.       General Comments      Exercises Total Joint Exercises Ankle Circles/Pumps: AROM;Both;20 reps;Supine   Assessment/Plan    PT Assessment Patient needs continued PT services  PT Problem List Decreased strength;Decreased range of motion;Decreased activity tolerance;Decreased balance;Decreased mobility;Decreased knowledge of use of DME;Decreased knowledge of precautions;Pain       PT Treatment Interventions DME instruction;Gait training;Stair training;Functional mobility training;Therapeutic activities;Therapeutic exercise;Balance training;Patient/family education    PT Goals (Current goals can be found in the Care Plan section)  Acute Rehab PT Goals Patient Stated Goal: to get home and have less pain PT Goal Formulation: With patient Time For Goal Achievement:  12/21/19 Potential to Achieve Goals: Good    Frequency 7X/week    AM-PAC PT "6 Clicks" Mobility  Outcome Measure Help needed turning from your back to your side while in a flat bed without using bedrails?: A Little Help needed moving from lying on your back to sitting on the side of a flat bed without using bedrails?: A Little Help needed moving to and from a bed to a chair (including a wheelchair)?: A Little Help needed standing up from a chair using your arms (e.g., wheelchair or bedside chair)?: A Little Help needed to walk in hospital room?: A Little Help needed climbing 3-5 steps with a railing? : A Lot 6 Click Score: 17    End of Session Equipment Utilized During Treatment: Gait belt Activity Tolerance: Patient limited by pain Patient left: in bed;with call bell/phone within reach;with family/visitor present Nurse Communication: Mobility status PT Visit Diagnosis: Muscle weakness (generalized) (M62.81);Difficulty in walking, not elsewhere classified (R26.2);Pain Pain - Right/Left: Right Pain - part of body: Knee    Time: 1326-1411 PT Time Calculation (min) (ACUTE ONLY): 45 min   Charges:   PT Evaluation $PT Eval Low Complexity: 1 Low PT Treatments $Gait Training: 8-22 mins $Self Care/Home Management: 8-22      Wynn Maudlin, DPT Physical Therapist with [facility] [phone]  12/14/2019 5:51 PM

## 2019-12-14 NOTE — Anesthesia Procedure Notes (Signed)
Spinal  Patient location during procedure: OR Start time: 12/14/2019 7:29 AM End time: 12/14/2019 7:32 AM Staffing Performed: anesthesiologist  Anesthesiologist: Arta Bruce, MD Preanesthetic Checklist Completed: patient identified, IV checked, risks and benefits discussed, surgical consent, monitors and equipment checked, pre-op evaluation and timeout performed Spinal Block Patient position: sitting Prep: DuraPrep Patient monitoring: blood pressure, continuous pulse ox, cardiac monitor and heart rate Approach: right paramedian Location: L3-4 Injection technique: single-shot Needle Needle type: Pencan  Needle gauge: 24 G Needle length: 9 cm Needle insertion depth: 7 cm

## 2019-12-15 DIAGNOSIS — Z96651 Presence of right artificial knee joint: Secondary | ICD-10-CM | POA: Diagnosis not present

## 2019-12-15 DIAGNOSIS — M170 Bilateral primary osteoarthritis of knee: Secondary | ICD-10-CM | POA: Diagnosis not present

## 2019-12-15 DIAGNOSIS — M1711 Unilateral primary osteoarthritis, right knee: Secondary | ICD-10-CM | POA: Diagnosis not present

## 2019-12-15 LAB — BASIC METABOLIC PANEL
Anion gap: 10 (ref 5–15)
BUN: 13 mg/dL (ref 6–20)
CO2: 26 mmol/L (ref 22–32)
Calcium: 8.2 mg/dL — ABNORMAL LOW (ref 8.9–10.3)
Chloride: 103 mmol/L (ref 98–111)
Creatinine, Ser: 0.8 mg/dL (ref 0.44–1.00)
GFR calc Af Amer: >60 mL/min (ref >60)
GFR calc non Af Amer: >60 mL/min (ref >60)
Glucose, Bld: 118 mg/dL — ABNORMAL HIGH (ref 70–99)
Potassium: 4.1 mmol/L (ref 3.5–5.1)
Sodium: 139 mmol/L (ref 135–145)

## 2019-12-15 LAB — CBC
HCT: 37.5 % (ref 36.0–46.0)
Hemoglobin: 12.3 g/dL (ref 12.0–15.0)
MCH: 32.2 pg (ref 26.0–34.0)
MCHC: 32.8 g/dL (ref 30.0–36.0)
MCV: 98.2 fL (ref 80.0–100.0)
Platelets: 191 10*3/uL (ref 150–400)
RBC: 3.82 MIL/uL — ABNORMAL LOW (ref 3.87–5.11)
RDW: 13.7 % (ref 11.5–15.5)
WBC: 5.8 10*3/uL (ref 4.0–10.5)
nRBC: 0 % (ref 0.0–0.2)

## 2019-12-15 LAB — SURGICAL PATHOLOGY

## 2019-12-15 NOTE — Progress Notes (Signed)
Physical Therapy Treatment Patient Details Name: Julia [name] MRN: [id number] DOB: 1966-05-26 Today's Date: 12/15/2019    History of Present Illness Patient is 54 y.o. female s/p Rt TKA on 12/14/19 with PMH significant for OA, depression, anxiety.    PT Comments    Pt  Doing very well today. Pain much better controlled, PT goals met, pt ready for d/c today    Follow Up Recommendations  Follow surgeon's recommendation for DC plan and follow-up therapies;Home health PT     Equipment Recommendations  None recommended by PT    Recommendations for Other Services       Precautions / Restrictions Precautions Precautions: Fall;Knee Restrictions Weight Bearing Restrictions: No Other Position/Activity Restrictions: WBAT    Mobility  Bed Mobility               General bed mobility comments: in chair   Transfers Overall transfer level: Needs assistance Equipment used: Rolling walker (2 wheeled) Transfers: Sit to/from Stand Sit to Stand: Min guard         General transfer comment: cues for hand placement and technique for power up   Ambulation/Gait Ambulation/Gait assistance: Min guard Gait Distance (Feet): 60 Feet Assistive device: Rolling walker (2 wheeled) Gait Pattern/deviations: Step-to pattern;Decreased step length - left;Decreased stance time - right;Decreased weight shift to right Gait velocity: decreased   General Gait Details: cues for safe step pattern and proximity to RW.    Stairs Stairs: Yes Stairs assistance: Min guard;Min assist Stair Management: Step to pattern;Forwards;With walker Number of Stairs: 5(x2) General stair comments: cues for sequence and safe technique, caregiver present    Wheelchair Mobility    Modified Rankin (Stroke Patients Only)       Balance Overall balance assessment: Needs assistance Sitting-balance support: Feet supported Sitting balance-Leahy Scale: Fair     Standing balance support: During functional  activity;Bilateral upper extremity supported Standing balance-Leahy Scale: Poor                              Cognition Arousal/Alertness: Awake/alert Behavior During Therapy: WFL for tasks assessed/performed(distressed but with emotional support improved) Overall Cognitive Status: Within Functional Limits for tasks assessed                                        Exercises Total Joint Exercises Ankle Circles/Pumps: AROM;Both;20 reps;Supine Quad Sets: AROM;Both;10 reps Short Arc Quad: AROM;10 reps;Right Heel Slides: AAROM;Right;10 reps Hip ABduction/ADduction: AROM;Right;10 reps Straight Leg Raises: AAROM;Right;10 reps Knee Flexion: AAROM;Right;5 reps Goniometric ROM: grossly 6 to 60 degrees flexion    General Comments        Pertinent Vitals/Pain Pain Assessment: 0-10 Pain Score: 5  Pain Location: Rt knee Pain Descriptors / Indicators: Discomfort;Grimacing;Guarding;Sore Pain Intervention(s): Limited activity within patient's tolerance;Monitored during session;Premedicated before session    Home Living                      Prior Function            PT Goals (current goals can now be found in the care plan section) Acute Rehab PT Goals Patient Stated Goal: to get home and have less pain PT Goal Formulation: With patient Time For Goal Achievement: 12/21/19 Potential to Achieve Goals: Good Progress towards PT goals: Progressing toward goals    Frequency    7X/week  PT Plan Current plan remains appropriate    Co-evaluation              AM-PAC PT "6 Clicks" Mobility   Outcome Measure  Help needed turning from your back to your side while in a flat bed without using bedrails?: A Little Help needed moving from lying on your back to sitting on the side of a flat bed without using bedrails?: A Little Help needed moving to and from a bed to a chair (including a wheelchair)?: A Little Help needed standing up from a  chair using your arms (e.g., wheelchair or bedside chair)?: A Little Help needed to walk in hospital room?: A Little Help needed climbing 3-5 steps with a railing? : A Little 6 Click Score: 18    End of Session Equipment Utilized During Treatment: Gait belt Activity Tolerance: Patient tolerated treatment well Patient left: with call bell/phone within reach;in chair;with family/visitor present;with nursing/sitter in room Nurse Communication: Mobility status PT Visit Diagnosis: Muscle weakness (generalized) (M62.81);Difficulty in walking, not elsewhere classified (R26.2);Pain Pain - Right/Left: Right Pain - part of body: Knee     Time: 3810-1751 PT Time Calculation (min) (ACUTE ONLY): 33 min  Charges:  $Gait Training: 8-22 mins $Therapeutic Exercise: 8-22 mins                     Amedee Cerrone, PT   Acute Rehab Dept [facility]): [phone]   12/15/2019    [facility] 12/15/2019, 11:27 AM

## 2019-12-15 NOTE — Op Note (Signed)
PATIENT ID: Julia [name]        MRN:  [id number]          DOB/AGE: [birth year] / 54 y.o.    Joni Fears, MD   Biagio Borg, PA-C [address] [place], [place]  [address]                             [phone]   PROGRESS NOTE  Subjective:  negative for Chest Pain  negative for Shortness of Breath  negative for Nausea/Vomiting   negative for Calf Pain    Tolerating Diet: yes         Patient reports pain as moderate.     Controlled with present analgesic regimen  Objective: Vital signs in last 24 hours:    Patient Vitals for the past 24 hrs:  BP Temp Temp src Pulse Resp SpO2  12/15/19 0533 (!) 110/56 97.7 F (36.5 C) Oral (!) 52 18 99 %  12/15/19 0126 116/63 (!) 97.5 F (36.4 C) -- 68 18 99 %  12/14/19 2151 -- -- -- -- -- 94 %  12/14/19 2150 (!) 108/58 98.3 F (36.8 C) Oral 63 18 (!) 89 %  12/14/19 1845 (!) 147/82 98.5 F (36.9 C) Oral 75 16 95 %  12/14/19 1744 (!) 154/94 98 F (36.7 C) -- (!) 57 16 95 %  12/14/19 1531 (!) 152/89 -- -- -- -- --  12/14/19 1130 (!) 103/56 97.8 F (36.6 C) -- (!) 53 18 98 %  12/14/19 1115 -- 97.9 F (36.6 C) -- -- -- --  12/14/19 1100 (!) 82/70 -- -- (!) 51 13 98 %  12/14/19 1045 102/67 98 F (36.7 C) -- 62 18 96 %  12/14/19 1030 95/60 -- -- 62 16 95 %  12/14/19 1015 101/64 -- -- 64 18 94 %  12/14/19 1001 (!) 92/47 -- -- 70 (!) 24 96 %  12/14/19 0945 (!) 94/58 -- -- 85 16 96 %  12/14/19 0941 (!) 96/46 97.7 F (36.5 C) -- 85 (!) 21 (!) 87 %      Intake/Output from previous day:   06/01 0701 - 06/02 0700 In: 3802.4 [P.O.:1340; I.V.:2062.4] Out: 1025 [Urine:1000]   Intake/Output this shift:   No intake/output data recorded.   Intake/Output      06/01 0701 - 06/02 0700 06/02 0701 - 06/03 0700   P.O. 1340    I.V. (mL/kg) 2062.4 (17.6)    IV Piggyback 400    Total Intake(mL/kg) 3802.4 (32.4)    Urine (mL/kg/hr) 1000 (0.4)    Stool 0    Blood 25    Total Output 1025    Net +2777.4         Stool Occurrence 0 x        LABORATORY DATA: Recent Labs    12/15/19 0325  WBC 5.8  HGB 12.3  HCT 37.5  PLT 191   Recent Labs    12/15/19 0325  NA 139  K 4.1  CL 103  CO2 26  BUN 13  CREATININE 0.80  GLUCOSE 118*  CALCIUM 8.2*   Lab Results  Component Value Date   INR 1.0 12/03/2019    Recent Radiographic Studies :  DG Chest 2 View  Result Date: 12/03/2019 CLINICAL DATA:  Preop evaluation for upcoming knee surgery EXAM: CHEST - 2 VIEW COMPARISON:  None. FINDINGS: The heart size and mediastinal contours are within normal limits. Both lungs are clear.  The visualized skeletal structures are unremarkable. IMPRESSION: No active cardiopulmonary disease. Electronically Signed   By: Alcide Clever M.D.   On: 12/03/2019 14:24     Examination:  General appearance: alert, cooperative and no distress  Wound Exam: clean, dry, intact   Drainage:  None: wound tissue dry  Motor Exam: EHL, FHL, Anterior Tibial and Posterior Tibial Intact  Sensory Exam: Superficial Peroneal, Deep Peroneal and Tibial normal  Vascular Exam: Normal  Assessment:    1 Day Post-Op  Procedure(s) (LRB): RIGHT TOTAL KNEE ARTHROPLASTY (Right)  ADDITIONAL DIAGNOSIS:  Active Problems:   Total knee replacement status, right     Plan: Physical Therapy as ordered Weight Bearing as Tolerated (WBAT)  DVT Prophylaxis:  Aspirin and TED hose  DISCHARGE PLAN: Home  DISCHARGE NEEDS: HHPT, CPM, Walker and 3-in-1 comode seat   Patient's anticipated LOS is less than 2 midnights, meeting these requirements: - Younger than 91 - Lives within 1 hour of care - Has a competent adult at home to recover with post-op recover - NO history of  - Chronic pain requiring opiods  - Diabetes  - Coronary Artery Disease  - Heart failure  - Heart attack  - Stroke  - DVT/VTE  - Cardiac arrhythmia  - Respiratory Failure/COPD  - Renal failure  - Anemia  - Advanced Liver disease Doing well-dressing changed. No calf pain or SOB. Voiding  after catheter removed. Will discharge today with f/u in 2 weeks         [name], Cordelia Poche [facility]  12/15/2019 7:46 AM

## 2019-12-15 NOTE — Plan of Care (Signed)
All discharge instructions were given to Pt. All questions were answered. 

## 2019-12-16 ENCOUNTER — Telehealth: Payer: Self-pay

## 2019-12-16 ENCOUNTER — Other Ambulatory Visit: Payer: Self-pay

## 2019-12-16 ENCOUNTER — Encounter: Payer: Self-pay | Admitting: *Deleted

## 2019-12-16 DIAGNOSIS — M1712 Unilateral primary osteoarthritis, left knee: Secondary | ICD-10-CM | POA: Diagnosis not present

## 2019-12-16 DIAGNOSIS — Z96651 Presence of right artificial knee joint: Secondary | ICD-10-CM | POA: Diagnosis not present

## 2019-12-16 DIAGNOSIS — F419 Anxiety disorder, unspecified: Secondary | ICD-10-CM | POA: Diagnosis not present

## 2019-12-16 DIAGNOSIS — F329 Major depressive disorder, single episode, unspecified: Secondary | ICD-10-CM | POA: Diagnosis not present

## 2019-12-16 DIAGNOSIS — M19041 Primary osteoarthritis, right hand: Secondary | ICD-10-CM | POA: Diagnosis not present

## 2019-12-16 DIAGNOSIS — Z471 Aftercare following joint replacement surgery: Secondary | ICD-10-CM | POA: Diagnosis not present

## 2019-12-16 DIAGNOSIS — Z90710 Acquired absence of both cervix and uterus: Secondary | ICD-10-CM | POA: Diagnosis not present

## 2019-12-16 DIAGNOSIS — Z7982 Long term (current) use of aspirin: Secondary | ICD-10-CM | POA: Diagnosis not present

## 2019-12-16 DIAGNOSIS — Z6839 Body mass index (BMI) 39.0-39.9, adult: Secondary | ICD-10-CM | POA: Diagnosis not present

## 2019-12-16 DIAGNOSIS — E6609 Other obesity due to excess calories: Secondary | ICD-10-CM | POA: Diagnosis not present

## 2019-12-16 DIAGNOSIS — M19042 Primary osteoarthritis, left hand: Secondary | ICD-10-CM | POA: Diagnosis not present

## 2019-12-16 NOTE — Telephone Encounter (Signed)
Tried to call patient. No answer. Voicemail is full. [facility] called and states that patient was complaining of soreness in her calf that brings tears to her eyes. She was able to perform all exercises with home therapy today. She has moderate swelling in her lower leg and good color. After speaking with Dr.Whitfield, he wants to order a doppler to ensure that she does not have a DVT since she is two days out from surgery. I have tried to reach patient and patient's spouse...no answer with either number,

## 2019-12-17 ENCOUNTER — Other Ambulatory Visit: Payer: Self-pay

## 2019-12-17 ENCOUNTER — Telehealth: Payer: Self-pay | Admitting: *Deleted

## 2019-12-17 ENCOUNTER — Ambulatory Visit (HOSPITAL_COMMUNITY)
Admission: RE | Admit: 2019-12-17 | Discharge: 2019-12-17 | Disposition: A | Discharge status: Home / Self Care | Payer: BC Managed Care – PPO | Source: Ambulatory Visit | Attending: Orthopaedic Surgery | Admitting: Orthopaedic Surgery

## 2019-12-17 ENCOUNTER — Telehealth: Payer: Self-pay

## 2019-12-17 DIAGNOSIS — Z96651 Presence of right artificial knee joint: Secondary | ICD-10-CM

## 2019-12-17 NOTE — Telephone Encounter (Signed)
Barnet Pall with [facility] wanted to let Dr. Cleophas Dunker know that patient is Negative for DVT, right LE and no evidence of Baker's Cyst.

## 2019-12-17 NOTE — Progress Notes (Signed)
Right lower extremity venous duplex has been completed. Preliminary results can be found in CV Proc through chart review.  Results were given to April at Dr. Hoy Register office.  12/17/19 11:27 AM Olen Cordial RVT

## 2019-12-17 NOTE — Telephone Encounter (Signed)
Called pt she is scheduled for Ultrasound Vascular today 12/17/19 at [facility] at 11am.

## 2019-12-18 DIAGNOSIS — F329 Major depressive disorder, single episode, unspecified: Secondary | ICD-10-CM | POA: Diagnosis not present

## 2019-12-18 DIAGNOSIS — Z6839 Body mass index (BMI) 39.0-39.9, adult: Secondary | ICD-10-CM | POA: Diagnosis not present

## 2019-12-18 DIAGNOSIS — Z7982 Long term (current) use of aspirin: Secondary | ICD-10-CM | POA: Diagnosis not present

## 2019-12-18 DIAGNOSIS — Z96651 Presence of right artificial knee joint: Secondary | ICD-10-CM | POA: Diagnosis not present

## 2019-12-18 DIAGNOSIS — M19041 Primary osteoarthritis, right hand: Secondary | ICD-10-CM | POA: Diagnosis not present

## 2019-12-18 DIAGNOSIS — Z90710 Acquired absence of both cervix and uterus: Secondary | ICD-10-CM | POA: Diagnosis not present

## 2019-12-18 DIAGNOSIS — M19042 Primary osteoarthritis, left hand: Secondary | ICD-10-CM | POA: Diagnosis not present

## 2019-12-18 DIAGNOSIS — M1712 Unilateral primary osteoarthritis, left knee: Secondary | ICD-10-CM | POA: Diagnosis not present

## 2019-12-18 DIAGNOSIS — F419 Anxiety disorder, unspecified: Secondary | ICD-10-CM | POA: Diagnosis not present

## 2019-12-18 DIAGNOSIS — Z471 Aftercare following joint replacement surgery: Secondary | ICD-10-CM | POA: Diagnosis not present

## 2019-12-18 DIAGNOSIS — E6609 Other obesity due to excess calories: Secondary | ICD-10-CM | POA: Diagnosis not present

## 2019-12-19 ENCOUNTER — Encounter: Payer: Self-pay | Admitting: Orthopaedic Surgery

## 2019-12-20 ENCOUNTER — Other Ambulatory Visit: Payer: Self-pay | Admitting: Orthopedic Surgery

## 2019-12-20 ENCOUNTER — Encounter: Payer: Self-pay | Admitting: Orthopaedic Surgery

## 2019-12-20 DIAGNOSIS — F329 Major depressive disorder, single episode, unspecified: Secondary | ICD-10-CM | POA: Diagnosis not present

## 2019-12-20 DIAGNOSIS — Z96651 Presence of right artificial knee joint: Secondary | ICD-10-CM | POA: Diagnosis not present

## 2019-12-20 DIAGNOSIS — M1712 Unilateral primary osteoarthritis, left knee: Secondary | ICD-10-CM | POA: Diagnosis not present

## 2019-12-20 DIAGNOSIS — E6609 Other obesity due to excess calories: Secondary | ICD-10-CM | POA: Diagnosis not present

## 2019-12-20 DIAGNOSIS — M19042 Primary osteoarthritis, left hand: Secondary | ICD-10-CM | POA: Diagnosis not present

## 2019-12-20 DIAGNOSIS — Z6839 Body mass index (BMI) 39.0-39.9, adult: Secondary | ICD-10-CM | POA: Diagnosis not present

## 2019-12-20 DIAGNOSIS — M19041 Primary osteoarthritis, right hand: Secondary | ICD-10-CM | POA: Diagnosis not present

## 2019-12-20 DIAGNOSIS — Z90710 Acquired absence of both cervix and uterus: Secondary | ICD-10-CM | POA: Diagnosis not present

## 2019-12-20 DIAGNOSIS — Z471 Aftercare following joint replacement surgery: Secondary | ICD-10-CM | POA: Diagnosis not present

## 2019-12-20 DIAGNOSIS — Z7982 Long term (current) use of aspirin: Secondary | ICD-10-CM | POA: Diagnosis not present

## 2019-12-20 DIAGNOSIS — F419 Anxiety disorder, unspecified: Secondary | ICD-10-CM | POA: Diagnosis not present

## 2019-12-20 MED ORDER — HYDROMORPHONE HCL 2 MG PO TABS: 2.0000 mg | ORAL_TABLET | ORAL | 0 refills | Status: AC | PRN

## 2019-12-22 DIAGNOSIS — Z471 Aftercare following joint replacement surgery: Secondary | ICD-10-CM | POA: Diagnosis not present

## 2019-12-22 DIAGNOSIS — F419 Anxiety disorder, unspecified: Secondary | ICD-10-CM | POA: Diagnosis not present

## 2019-12-22 DIAGNOSIS — Z7982 Long term (current) use of aspirin: Secondary | ICD-10-CM | POA: Diagnosis not present

## 2019-12-22 DIAGNOSIS — M1712 Unilateral primary osteoarthritis, left knee: Secondary | ICD-10-CM | POA: Diagnosis not present

## 2019-12-22 DIAGNOSIS — Z96651 Presence of right artificial knee joint: Secondary | ICD-10-CM | POA: Diagnosis not present

## 2019-12-22 DIAGNOSIS — Z6839 Body mass index (BMI) 39.0-39.9, adult: Secondary | ICD-10-CM | POA: Diagnosis not present

## 2019-12-22 DIAGNOSIS — M19041 Primary osteoarthritis, right hand: Secondary | ICD-10-CM | POA: Diagnosis not present

## 2019-12-22 DIAGNOSIS — M19042 Primary osteoarthritis, left hand: Secondary | ICD-10-CM | POA: Diagnosis not present

## 2019-12-22 DIAGNOSIS — E6609 Other obesity due to excess calories: Secondary | ICD-10-CM | POA: Diagnosis not present

## 2019-12-22 DIAGNOSIS — F329 Major depressive disorder, single episode, unspecified: Secondary | ICD-10-CM | POA: Diagnosis not present

## 2019-12-22 DIAGNOSIS — Z90710 Acquired absence of both cervix and uterus: Secondary | ICD-10-CM | POA: Diagnosis not present

## 2019-12-22 IMAGING — DX DG KNEE COMPLETE 4+V*R*
4 series · 4 of 4 positions shown · non-contrast
Comparison: None.

CLINICAL DATA: Chronic RIGHT knee pain.

EXAM:
RIGHT KNEE - COMPLETE 4+ VIEW

[knee ap]
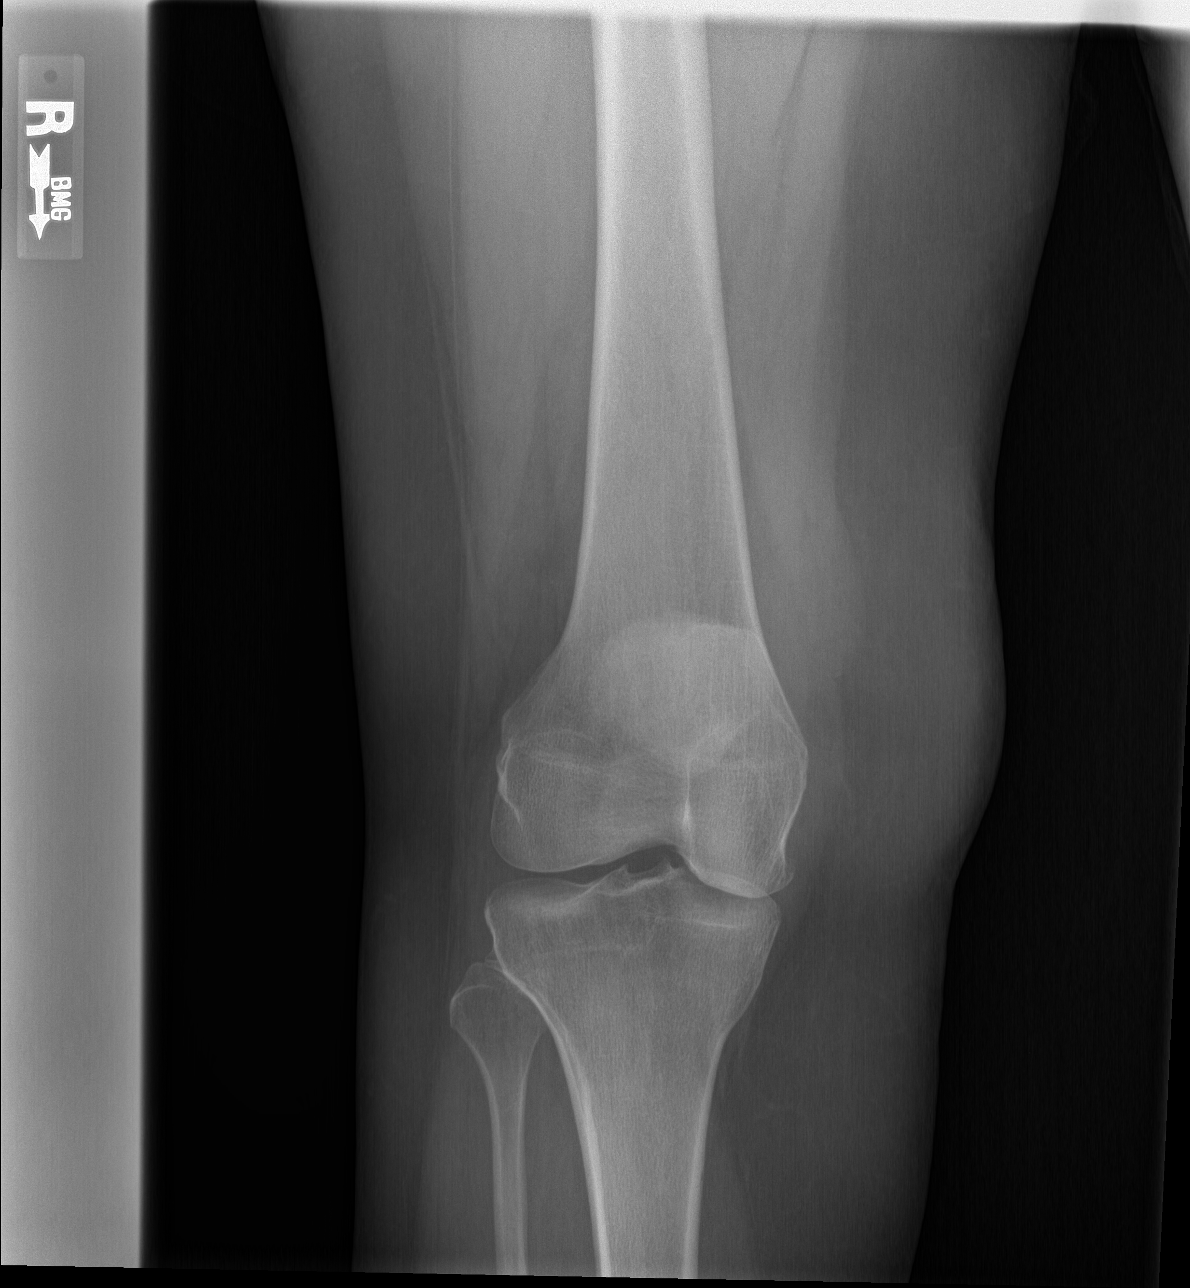

[knee lat]
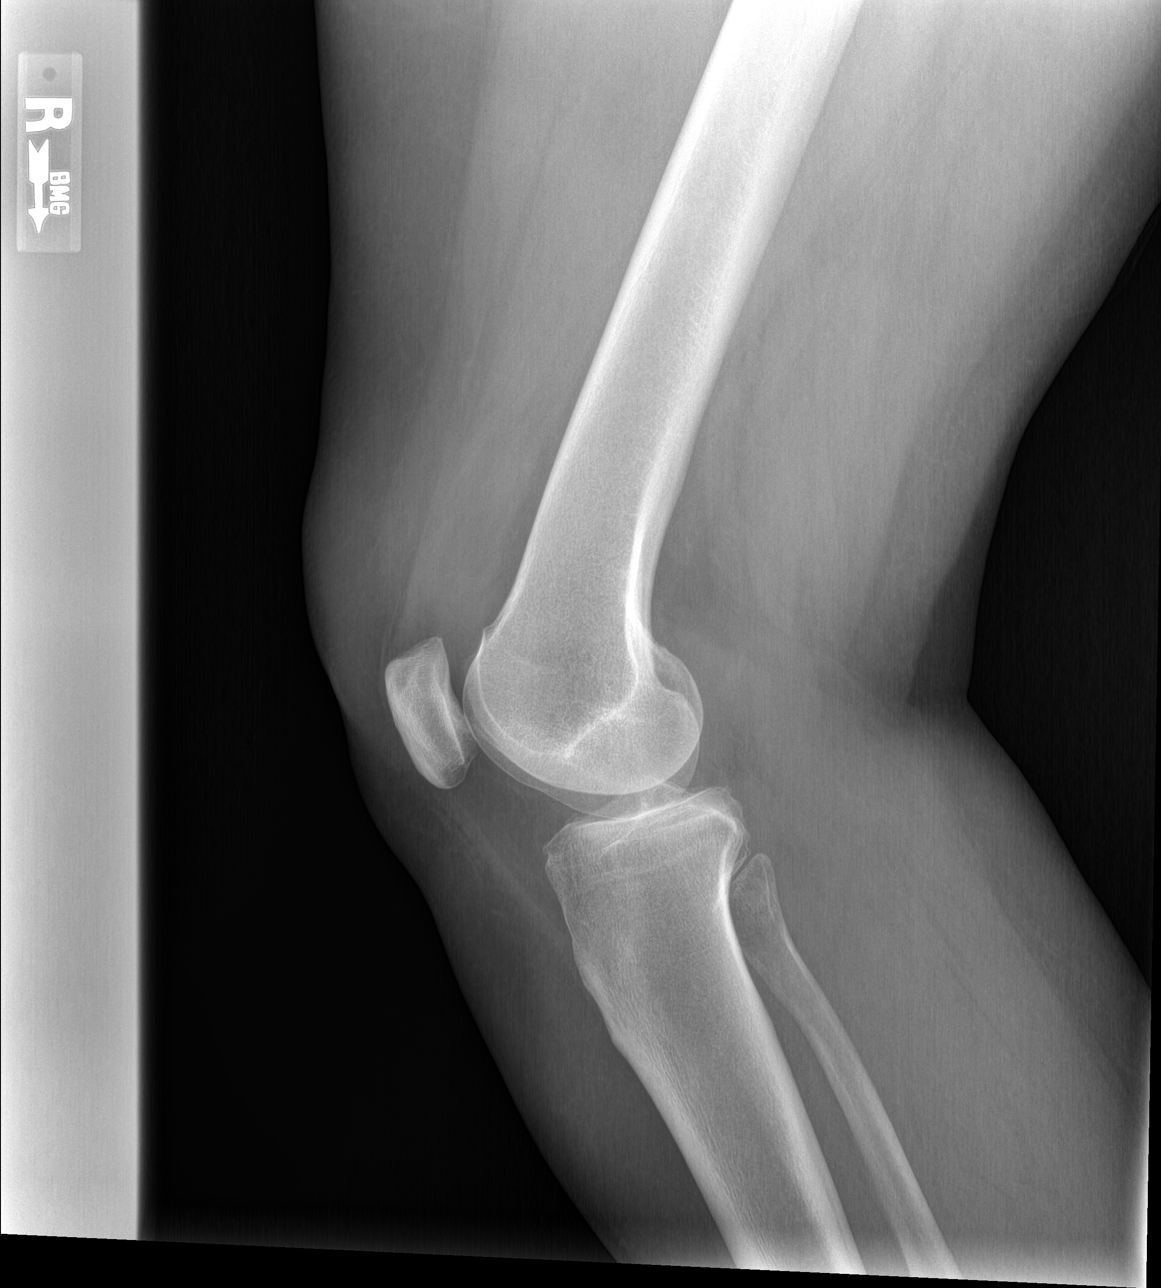

[sunrise]
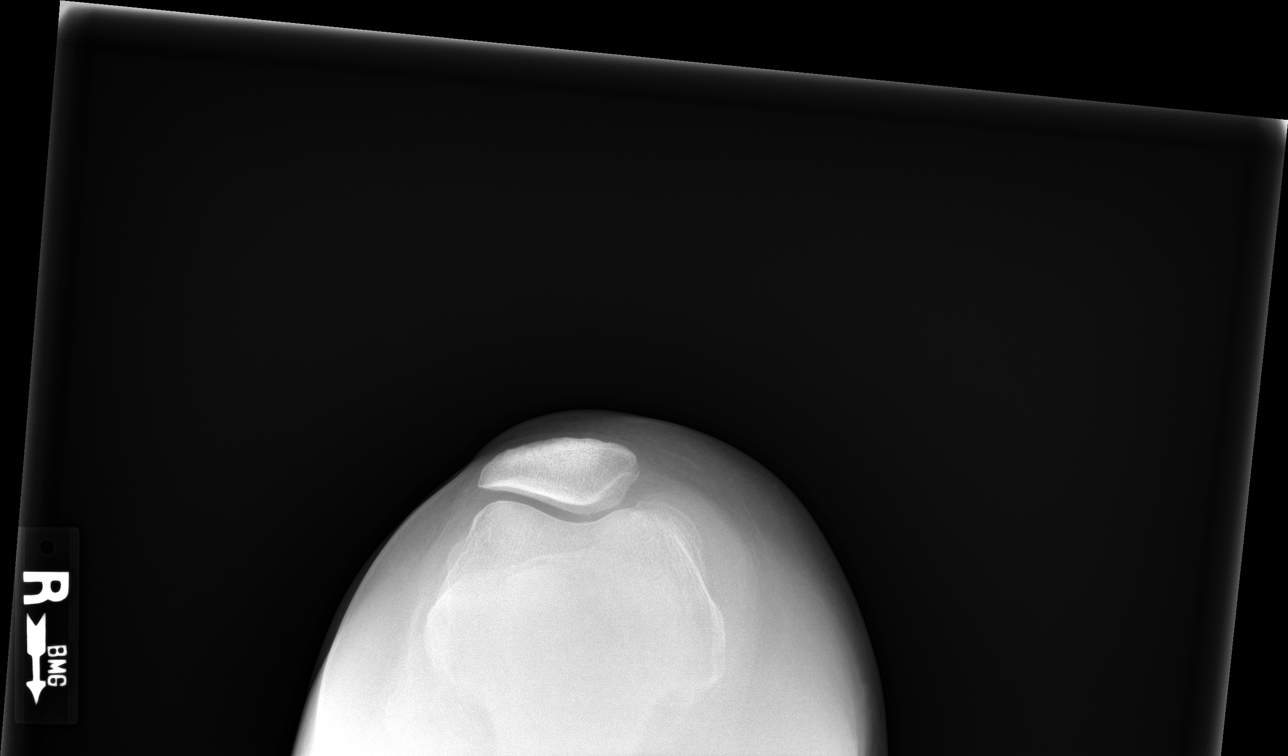

[knee [person_name]]
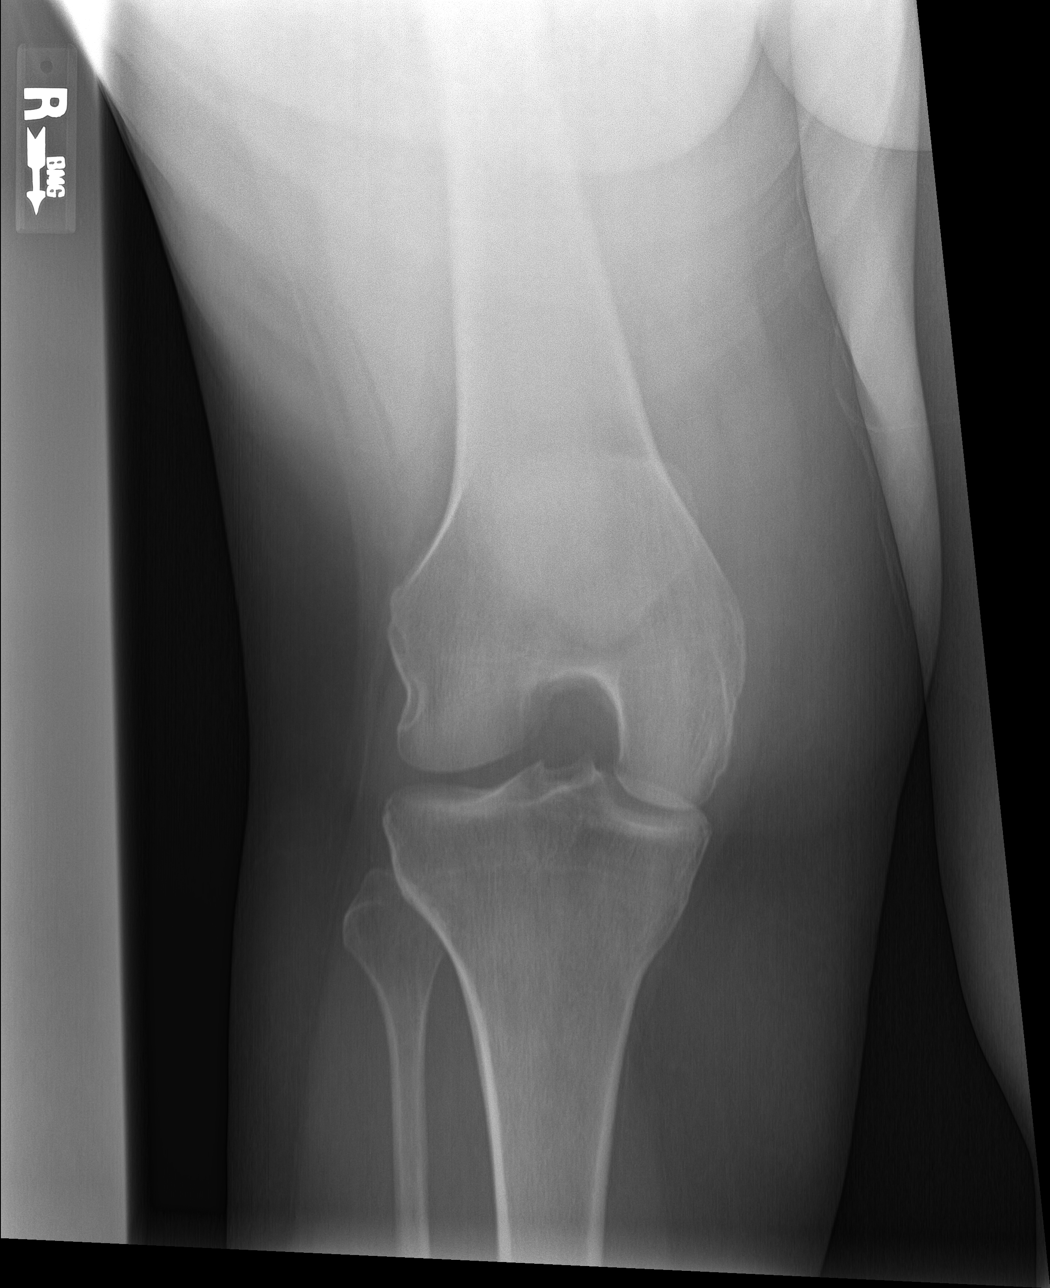

[4 of 4 positions shown; findings below may reference images not displayed]

FINDINGS: No acute fracture, subluxation or dislocation.

A knee effusion is present.

No other joint abnormalities noted.

No focal bony lesions are present.
IMPRESSION: Knee effusion without bony abnormality.

## 2019-12-23 ENCOUNTER — Ambulatory Visit (INDEPENDENT_AMBULATORY_CARE_PROVIDER_SITE_OTHER): Payer: BC Managed Care – PPO | Admitting: Orthopedic Surgery

## 2019-12-23 ENCOUNTER — Other Ambulatory Visit: Payer: Self-pay | Admitting: Orthopedic Surgery

## 2019-12-23 ENCOUNTER — Other Ambulatory Visit: Payer: Self-pay

## 2019-12-23 ENCOUNTER — Ambulatory Visit: Payer: Self-pay

## 2019-12-23 ENCOUNTER — Telehealth: Payer: Self-pay | Admitting: Orthopaedic Surgery

## 2019-12-23 ENCOUNTER — Encounter: Payer: Self-pay | Admitting: Orthopedic Surgery

## 2019-12-23 VITALS — Ht 68.0 in | Wt 259.0 lb

## 2019-12-23 DIAGNOSIS — Z96651 Presence of right artificial knee joint: Secondary | ICD-10-CM | POA: Diagnosis not present

## 2019-12-23 MED ORDER — OXYCODONE HCL ER 10 MG PO T12A: 10.0000 mg | EXTENDED_RELEASE_TABLET | Freq: Two times a day (BID) | ORAL | Status: DC

## 2019-12-23 MED ORDER — ACETAMINOPHEN 325 MG PO TABS
650.0000 mg | ORAL_TABLET | Freq: Four times a day (QID) | ORAL | 2 refills | Status: AC | PRN
Start: 2019-12-23 — End: 2020-12-22

## 2019-12-23 MED ORDER — OXYCODONE HCL 5 MG PO CAPS: 5.0000 mg | ORAL_CAPSULE | ORAL | 0 refills | Status: DC | PRN

## 2019-12-23 MED ORDER — OXYCODONE HCL ER 10 MG PO T12A: 10.0000 mg | EXTENDED_RELEASE_TABLET | Freq: Two times a day (BID) | ORAL | 0 refills | Status: DC

## 2019-12-23 MED ORDER — GABAPENTIN 100 MG PO CAPS
100.0000 mg | ORAL_CAPSULE | Freq: Three times a day (TID) | ORAL | 0 refills | Status: DC
Start: 2019-12-23 — End: 2022-09-09

## 2019-12-23 NOTE — Progress Notes (Signed)
Office Visit Note   Patient: Julia [name]           Date of Birth: [birth year]           MRN: [id number] Visit Date: 12/23/2019              Requested by: Eunice Blase, MD [address] [place],  [place] [address] PCP: Eunice Blase, MD   Assessment & Plan: Visit Diagnoses:  1. Status post total right knee replacement   2. Total knee replacement status, right     Plan:  #1: At this time with discussion with Dr. Marlou Sa after seeing the patient was going to do the following:  Oxycontin 10 mg tablets every 12 hours  OxyIR 5 mg 1-2 tabs every 4-6 hours as needed pain  Neurontin 100 mg 3 times daily  Tylenol 650 mg every 6 hours #2: Continue with her icing #3: We will see her back next week or earlier if she is not doing well.    Follow-Up Instructions: Return in about 5 days (around 12/28/2019).   Orders:  Orders Placed This Encounter  Procedures  . XR KNEE 3 VIEW RIGHT   Meds ordered this encounter  Medications  . oxycodone (OXY-IR) 5 MG capsule    Sig: Take 1-2 capsules (5-10 mg total) by mouth every 4 (four) hours as needed for pain (STOP DILAUDID).    Dispense:  30 capsule    Refill:  0    Order Specific Question:   Supervising Provider    Answer:   Garald Balding [[id number]]  . gabapentin (NEURONTIN) 100 MG capsule    Sig: Take 1 capsule (100 mg total) by mouth 3 (three) times daily.    Dispense:  40 capsule    Refill:  0    Order Specific Question:   Supervising Provider    Answer:   Garald Balding [[id number]]  . oxyCODONE (OXYCONTIN) 12 hr tablet 10 mg      Procedures: No procedures performed   Clinical Data: No additional findings.   Subjective: Chief Complaint  Patient presents with  . Right Knee - Routine Post Op    Right TKA DOS 12-14-2019     HPI Patient presents today for follow up on her right knee. She had a right total knee arthroplasty on 12-14-2019, and is now 9 days out from surgery. She states that she has had intense pain in her knee  since surgery. She had a doppler study to rule out DVT on 12-17-2019 and it was negative. She is taking Dilaudid 2mg  every 4hours and states that it does not last until the next dose can be taken. She states that the pain medicine "does nothing for me". She is having a difficult time sleeping due to pain. She has been doing home therapy three times weekly. She has been doing her CPM machine as directed. She states that she has a constant stabbing, piercing pain in her knee. She walks with a walker. She states that she took 6mg  of Dilaudid last night and was able to sleep.    Review of Systems   Objective: Vital Signs: Ht 5\' 8"  (1.727 m)   Wt 259 lb (117.5 kg)   BMI 39.38 kg/m   Physical Exam  Ortho Exam  Exam today reveals swollen right knee with limited range of motion from about 5 degrees shy of full extension to around 85 degrees of flexion.  It appears to be ligamentously stable.  Calf is supple  nontender.  (Previous Doppler negative) . Good dorsalis pedis pulse.  Dr. August Saucer was kind enough to also evaluate the patient and felt this was just poor pain control.  Specialty Comments:  No specialty comments available.  Imaging: No results found.   PMFS History: Patient Active Problem List   Diagnosis Date Noted  . Total knee replacement status, right 12/14/2019  . Class 2 obesity due to excess calories without serious comorbidity with body mass index (BMI) of 39.0 to 39.9 in adult 11/05/2019  . Unilateral primary osteoarthritis, right knee 11/05/2019  . Unilateral primary osteoarthritis, left knee 11/05/2019  . Bilateral primary osteoarthritis of knee 01/25/2019   Past Medical History:  Diagnosis Date  . Anxiety   . Arthritis    knees, hands  . Depression     Family History  Problem Relation Age of Onset  . Cancer Mother     Past Surgical History:  Procedure Laterality Date  . ABDOMINAL HYSTERECTOMY    . KNEE ARTHROSCOPY    . 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    Procedure: RIGHT TOTAL KNEE ARTHROPLASTY;  Surgeon: Valeria Batman, MD;  Location: [facility];  Service: Orthopedics;  Laterality: Right;   Social History   Occupational History  . Not on file  Tobacco Use  . Smoking status: Former Smoker    Packs/day: 1.00    Years: 15.00    Pack years: 15.00    Types: Cigarettes    Quit date: 12/02/2012    Years since quitting: 7.0  . Smokeless tobacco: Never Used  Vaping Use  . Vaping Use: Never used  Substance and Sexual Activity  . Alcohol use: Not Currently  . Drug use: Never  . Sexual activity: Yes

## 2019-12-23 NOTE — Telephone Encounter (Signed)
Patient called.   She was returning a call from our office. Said the voicemail she was left didn't clear everything up for her  Call back: [phone]

## 2019-12-24 DIAGNOSIS — E6609 Other obesity due to excess calories: Secondary | ICD-10-CM | POA: Diagnosis not present

## 2019-12-24 DIAGNOSIS — Z90710 Acquired absence of both cervix and uterus: Secondary | ICD-10-CM | POA: Diagnosis not present

## 2019-12-24 DIAGNOSIS — M19041 Primary osteoarthritis, right hand: Secondary | ICD-10-CM | POA: Diagnosis not present

## 2019-12-24 DIAGNOSIS — Z471 Aftercare following joint replacement surgery: Secondary | ICD-10-CM | POA: Diagnosis not present

## 2019-12-24 DIAGNOSIS — Z6839 Body mass index (BMI) 39.0-39.9, adult: Secondary | ICD-10-CM | POA: Diagnosis not present

## 2019-12-24 DIAGNOSIS — M1712 Unilateral primary osteoarthritis, left knee: Secondary | ICD-10-CM | POA: Diagnosis not present

## 2019-12-24 DIAGNOSIS — Z7982 Long term (current) use of aspirin: Secondary | ICD-10-CM | POA: Diagnosis not present

## 2019-12-24 DIAGNOSIS — M19042 Primary osteoarthritis, left hand: Secondary | ICD-10-CM | POA: Diagnosis not present

## 2019-12-24 DIAGNOSIS — Z96651 Presence of right artificial knee joint: Secondary | ICD-10-CM | POA: Diagnosis not present

## 2019-12-24 DIAGNOSIS — F419 Anxiety disorder, unspecified: Secondary | ICD-10-CM | POA: Diagnosis not present

## 2019-12-24 DIAGNOSIS — F329 Major depressive disorder, single episode, unspecified: Secondary | ICD-10-CM | POA: Diagnosis not present

## 2019-12-27 DIAGNOSIS — Z90710 Acquired absence of both cervix and uterus: Secondary | ICD-10-CM | POA: Diagnosis not present

## 2019-12-27 DIAGNOSIS — M1712 Unilateral primary osteoarthritis, left knee: Secondary | ICD-10-CM | POA: Diagnosis not present

## 2019-12-27 DIAGNOSIS — M19041 Primary osteoarthritis, right hand: Secondary | ICD-10-CM | POA: Diagnosis not present

## 2019-12-27 DIAGNOSIS — Z6839 Body mass index (BMI) 39.0-39.9, adult: Secondary | ICD-10-CM | POA: Diagnosis not present

## 2019-12-27 DIAGNOSIS — E6609 Other obesity due to excess calories: Secondary | ICD-10-CM | POA: Diagnosis not present

## 2019-12-27 DIAGNOSIS — F329 Major depressive disorder, single episode, unspecified: Secondary | ICD-10-CM | POA: Diagnosis not present

## 2019-12-27 DIAGNOSIS — F419 Anxiety disorder, unspecified: Secondary | ICD-10-CM | POA: Diagnosis not present

## 2019-12-27 DIAGNOSIS — M19042 Primary osteoarthritis, left hand: Secondary | ICD-10-CM | POA: Diagnosis not present

## 2019-12-27 DIAGNOSIS — Z7982 Long term (current) use of aspirin: Secondary | ICD-10-CM | POA: Diagnosis not present

## 2019-12-27 DIAGNOSIS — Z471 Aftercare following joint replacement surgery: Secondary | ICD-10-CM | POA: Diagnosis not present

## 2019-12-27 DIAGNOSIS — Z96651 Presence of right artificial knee joint: Secondary | ICD-10-CM | POA: Diagnosis not present

## 2019-12-28 ENCOUNTER — Inpatient Hospital Stay: Payer: BC Managed Care – PPO | Admitting: Orthopaedic Surgery

## 2019-12-30 ENCOUNTER — Other Ambulatory Visit: Payer: Self-pay

## 2019-12-30 ENCOUNTER — Encounter: Payer: Self-pay | Admitting: Orthopaedic Surgery

## 2019-12-30 ENCOUNTER — Ambulatory Visit (INDEPENDENT_AMBULATORY_CARE_PROVIDER_SITE_OTHER): Payer: BC Managed Care – PPO | Admitting: Orthopaedic Surgery

## 2019-12-30 VITALS — Ht 68.0 in | Wt 259.0 lb

## 2019-12-30 DIAGNOSIS — Z96651 Presence of right artificial knee joint: Secondary | ICD-10-CM

## 2019-12-30 MED ORDER — OXYCODONE HCL 5 MG PO CAPS: 5.0000 mg | ORAL_CAPSULE | ORAL | 0 refills | Status: DC | PRN

## 2019-12-30 MED ORDER — OXYCODONE HCL ER 10 MG PO T12A: 10.0000 mg | EXTENDED_RELEASE_TABLET | Freq: Two times a day (BID) | ORAL | 0 refills | Status: DC

## 2019-12-30 NOTE — Progress Notes (Signed)
Office Visit Note   Patient: Julia [name]           Date of Birth: 01-Jul-1966           MRN: [id number] Visit Date: 12/30/2019              Requested by: Lavada Mesi, MD [address] [place],  [place] [address] PCP: Lavada Mesi, MD   Assessment & Plan: Visit Diagnoses:  1. Status post total right knee replacement   2. Total knee replacement status, right     Plan: #1: Staples were were removed and Steri-Strips were placed. #2: We are going to schedule her for outpatient physical therapy next week #3: Renewed her pain medicines #4: She will give [place] a call when she is out of the pain medicine will take her to hydrocodone #5: Follow back up 3 weeks.   Follow-Up Instructions: No follow-ups on file.   Orders:  Orders Placed This Encounter  Procedures  . Ambulatory referral to Physical Therapy   Meds ordered this encounter  Medications  . oxyCODONE (OXYCONTIN) 10 mg 12 hr tablet    Sig: Take 1 tablet (10 mg total) by mouth every 12 (twelve) hours.    Dispense:  14 tablet    Refill:  0    Order Specific Question:   Supervising Provider    Answer:   Valeria Batman [[id number]]  . oxycodone (OXY-IR) 5 MG capsule    Sig: Take 1-2 capsules (5-10 mg total) by mouth every 4 (four) hours as needed for pain (STOP DILAUDID).    Dispense:  30 capsule    Refill:  0    Order Specific Question:   Supervising Provider    Answer:   Valeria Batman [[id number]]      Procedures: No procedures performed   Clinical Data: No additional findings.   Subjective: Chief Complaint  Patient presents with  . Right Knee - Follow-up    Right TKA DOS 12-14-2019  Patient presents today for follow up on her right knee. She is now two weeks out from right total knee arthroplasty. She saw Arlys John last week because she was having a lot of pain post op.   HPI  Review of Systems   Objective: Vital Signs: Ht 5\' 8"  (1.727 m)   Wt 259 lb (117.5 kg)   BMI 39.38 kg/m   Physical Exam  Ortho  Exam  Specialty Comments:  No specialty comments available.  Imaging: No results found.   PMFS History: Patient Active Problem List   Diagnosis Date Noted  . Total knee replacement status, right 12/14/2019  . Class 2 obesity due to excess calories without serious comorbidity with body mass index (BMI) of 39.0 to 39.9 in adult 11/05/2019  . Unilateral primary osteoarthritis, right knee 11/05/2019  . Unilateral primary osteoarthritis, left knee 11/05/2019  . Bilateral primary osteoarthritis of knee 01/25/2019   Past Medical History:  Diagnosis Date  . Anxiety   . Arthritis    knees, hands  . Depression     Family History  Problem Relation Age of Onset  . Cancer Mother     Past Surgical History:  Procedure Laterality Date  . ABDOMINAL HYSTERECTOMY    . KNEE ARTHROSCOPY    . 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   Procedure: RIGHT TOTAL KNEE ARTHROPLASTY;  Surgeon: 02/13/2020, MD;  Location: [facility];  Service: Orthopedics;  Laterality: Right;   Social History   Occupational History  . Not on  file  Tobacco Use  . Smoking status: Former Smoker    Packs/day: 1.00    Years: 15.00    Pack years: 15.00    Types: Cigarettes    Quit date: 12/02/2012    Years since quitting: 7.0  . Smokeless tobacco: Never Used  Vaping Use  . Vaping Use: Never used  Substance and Sexual Activity  . Alcohol use: Not Currently  . Drug use: Never  . Sexual activity: Yes

## 2020-01-04 ENCOUNTER — Ambulatory Visit: Payer: BC Managed Care – PPO | Admitting: Physical Therapy

## 2020-01-06 ENCOUNTER — Ambulatory Visit (INDEPENDENT_AMBULATORY_CARE_PROVIDER_SITE_OTHER): Payer: BC Managed Care – PPO | Admitting: Physical Therapy

## 2020-01-06 ENCOUNTER — Encounter: Payer: Self-pay | Admitting: Physical Therapy

## 2020-01-06 ENCOUNTER — Other Ambulatory Visit: Payer: Self-pay

## 2020-01-06 ENCOUNTER — Other Ambulatory Visit: Payer: Self-pay | Admitting: Orthopedic Surgery

## 2020-01-06 DIAGNOSIS — M25661 Stiffness of right knee, not elsewhere classified: Secondary | ICD-10-CM

## 2020-01-06 DIAGNOSIS — M6281 Muscle weakness (generalized): Secondary | ICD-10-CM

## 2020-01-06 DIAGNOSIS — R600 Localized edema: Secondary | ICD-10-CM

## 2020-01-06 DIAGNOSIS — R2689 Other abnormalities of gait and mobility: Secondary | ICD-10-CM

## 2020-01-06 DIAGNOSIS — M25561 Pain in right knee: Secondary | ICD-10-CM

## 2020-01-06 MED ORDER — OXYCODONE HCL 5 MG PO TABS: 5.0000 mg | ORAL_TABLET | Freq: Three times a day (TID) | ORAL | 0 refills | Status: DC | PRN

## 2020-01-06 MED ORDER — OXYCODONE HCL ER 10 MG PO T12A: 10.0000 mg | EXTENDED_RELEASE_TABLET | Freq: Two times a day (BID) | ORAL | 0 refills | Status: DC

## 2020-01-06 NOTE — Therapy (Signed)
[facility] Physical Therapy [address] [place], [place], [address] Phone: [phone]   Fax:  [phone]  Physical Therapy Evaluation  Patient Details  Name: Julia [name] MRN: [id number] Date of Birth: Nov 03, 1965 Referring Provider (PT): Cleophas Dunker, MD   Encounter Date: 01/06/2020   PT End of Session - 01/06/20 1320    Visit Number 1    Number of Visits 23    Date for PT Re-Evaluation 03/30/20    Authorization Type BCBS    Authorization - Visit Number 1    Authorization - Number of Visits 23   30 VL used 7 in HHPT   PT Start Time 1015    PT Stop Time 1105    PT Time Calculation (min) 50 min    Activity Tolerance Patient tolerated treatment well    Behavior During Therapy [facility] for tasks assessed/performed           Past Medical History:  Diagnosis Date  . Anxiety   . Arthritis    knees, hands  . Depression     Past Surgical History:  Procedure Laterality Date  . ABDOMINAL HYSTERECTOMY    . KNEE ARTHROSCOPY    . 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   Procedure: RIGHT TOTAL KNEE ARTHROPLASTY;  Surgeon: Valeria Batman, MD;  Location: [facility];  Service: Orthopedics;  Laterality: Right;    There were no vitals filed for this visit.    Subjective Assessment - 01/06/20 1021    Subjective She had Rt TKA 12/14/19. She is doing okay after surgery but also relays her Lt knee will need TKA as well so her quality of life has been declining over last 2 years    Pertinent History PMH: Rt TKA 12/14/19, anx, dep, OA    How long can you stand comfortably? 10 min    How long can you walk comfortably? grocery store    Patient Stated Goals get back to normal and surf fishing    Currently in Pain? Yes    Pain Score 5     Pain Location Knee    Pain Orientation Right;Anterior;Lateral    Pain Descriptors / Indicators Aching;Throbbing;Stabbing    Pain Type Surgical pain    Pain Radiating Towards denies    Pain Onset 1 to 4 weeks ago    Pain Frequency  Constant    Aggravating Factors  sitting, sleeping, stairs    Pain Relieving Factors medicine, ice,              [facility] PT Assessment - 01/06/20 0001      Assessment   Medical Diagnosis Rt TKA    Referring Provider (PT) Cleophas Dunker, MD    Onset Date/Surgical Date 12/14/19    Hand Dominance Right    Next MD Visit 3 weeks    Prior Therapy HHPT 8 visits      Restrictions   Weight Bearing Restrictions No      Balance Screen   Has the patient fallen in the past 6 months No    Has the patient had a decrease in activity level because of a fear of falling?  No    Is the patient reluctant to leave their home because of a fear of falling?  No      Home Environment   Living Environment Private residence    Additional Comments has stairs, but has everything she needs on first floor      Prior Function   Level of Independence Independent  Vocation Other (comment)    Leisure fishing, biking      Cognition   Overall Cognitive Status Within Functional Limits for tasks assessed      Observation/Other Assessments   Observations incision looking, no drainage      ROM / Strength   AROM / PROM / Strength AROM;PROM;Strength      AROM   AROM Assessment Site Knee    Right/Left Knee Right    Right Knee Extension 12    Right Knee Flexion 92   AAROM with strap     PROM   PROM Assessment Site Knee    Right/Left Knee Right    Right Knee Extension 10    Right Knee Flexion 96      Strength   Overall Strength Comments 3+/5 knee strength on Rt, 3/5 hip strength grossly      Transfers   Comments needs min A for Rt leg for supine to sit and for car transfers      Ambulation/Gait   Ambulation/Gait Yes    Ambulation/Gait Assistance 6: Modified independent (Device/Increase time)    Ambulation Distance (Feet) 100 Feet    Assistive device Rolling walker    Gait Pattern Step-through pattern;Decreased step length - right;Decreased step length - left;Decreased stance time - right;Decreased  stance time - left;Decreased hip/knee flexion - right    Gait velocity slower                      Objective measurements completed on examination: See above findings.       [facility] Adult PT Treatment/Exercise - 01/06/20 0001      Exercises   Exercises Knee/Hip      Knee/Hip Exercises: Stretches   Active Hamstring Stretch Right;2 reps;30 seconds    Other Knee/Hip Stretches supine heelsides AAROM 10 sec X 10 reps    Other Knee/Hip Stretches seated heelslides with self OP from other leg 5 sec X 10 also LAQ resisting with other leg      Modalities   Modalities Vasopneumatic      Vasopneumatic   Number Minutes Vasopneumatic  10 minutes    Vasopnuematic Location  Knee    Vasopneumatic Pressure Medium    Vasopneumatic Temperature  34      Manual Therapy   Manual therapy comments Rt knee PROM                  PT Education - 01/06/20 1320    Education Details HEP, POC    Person(s) Educated Patient    Methods Explanation;Demonstration;Verbal cues;Handout    Comprehension Verbalized understanding;Need further instruction               PT Long Term Goals - 01/06/20 1338      PT LONG TERM GOAL #1   Title Pt will be I and compliant with HEP.    Time 12    Period Weeks    Status New    Target Date 03/30/20      PT LONG TERM GOAL #2   Title Pt will improve Rt knee ROM 3-110 degrees    Time 12    Period Weeks    Status New      PT LONG TERM GOAL #3   Title Pt will improve Rt knee strength to 5/5 MMT to improve function    Time 12    Period Weeks    Status New      PT LONG TERM GOAL #4  Title Pt will be able to ambulate community distances >500 ft independently, no AD    Time 12    Period Weeks    Status New                  Plan - 01/06/20 1324    Clinical Impression Statement Pt presents with Rt TKA 12/14/19. She has overall decresaed knee ROM, decreased strength, is having to use RW for ambulation (did not need one PLOF),  decreased standing/walking/sitting tolreance, increased swelling and increased pain in her knee. She will benfit from skilled PT to address her deficits.    Personal Factors and Comorbidities Comorbidity 3+    Comorbidities PMH: Rt TKA 12/14/19, anx, dep, OA    Examination-Activity Limitations Locomotion Level;Transfers;Carry;Sit;Sleep;Squat;Stairs;Stand;Lift    Examination-Participation Restrictions Cleaning;Driving;Community Activity;Shop;Laundry    Stability/Clinical Decision Making Stable/Uncomplicated    Clinical Decision Making Low    Rehab Potential Good    PT Frequency 2x / week   2-3   PT Duration 12 weeks    PT Treatment/Interventions Aquatic Therapy;Cryotherapy;Electrical Stimulation;Iontophoresis 4mg /ml Dexamethasone;Moist Heat;Ultrasound;Gait training;Stair training;Functional mobility training;Therapeutic activities;Therapeutic exercise;Balance training;Neuromuscular re-education;Manual techniques;Passive range of motion;Dry needling;Scar mobilization;Joint Manipulations;Vasopneumatic Device;Taping    PT Next Visit Plan review and update HEP PRN, needs knee ROM and gentle strength, try nu step, likes vaso    PT Home Exercise Plan Access Code: [email or web address]    Consulted and Agree with Plan of Care Patient           Patient will benefit from skilled therapeutic intervention in order to improve the following deficits and impairments:  Abnormal gait, Decreased activity tolerance, Decreased balance, Decreased endurance, Decreased mobility, Decreased range of motion, Decreased strength, Difficulty walking, Hypomobility, Increased edema, Increased fascial restricitons, Pain  Visit Diagnosis: Acute pain of right knee  Stiffness of right knee, not elsewhere classified  Muscle weakness (generalized)  Other abnormalities of gait and mobility  Localized edema     Problem List Patient Active Problem List   Diagnosis Date Noted  . Total knee replacement status, right 12/14/2019  .  Class 2 obesity due to excess calories without serious comorbidity with body mass index (BMI) of 39.0 to 39.9 in adult 11/05/2019  . Unilateral primary osteoarthritis, right knee 11/05/2019  . Unilateral primary osteoarthritis, left knee 11/05/2019  . Bilateral primary osteoarthritis of knee 01/25/2019    Silvestre Mesi 01/06/2020, 1:45 PM  [facility] Physical Therapy [address] [place], [place], [address] Phone: [phone]   Fax:  [phone]  Name: RONNESHA MESTER MRN: [id number] Date of Birth: 12-05-1965

## 2020-01-06 NOTE — Patient Instructions (Signed)
Access Code: [email or web address]: [email or web address]: 06/24/2021Prepared by: Arlys John NelsonExercises  Supine Heel Slide with Strap - 2 x daily - 6 x weekly - 10 reps - 1-2 sets - 5 hold  Heel Prop - 3 x daily - 6 x weekly - 1 reps - 3-5 min hold  Seated Hamstring Stretch with Chair - 2 x daily - 6 x weekly - 1 sets - 3 reps - 30 hold  Seated Knee Flexion Extension AROM - 2 x daily - 6 x weekly - 3 sets - 10 reps  Standing March with Counter Support - 2 x daily - 6 x weekly - 10 reps - 1-2 sets  Mini Squat with Counter Support - 2 x daily - 6 x weekly - 10 reps - 1-2 sets

## 2020-01-07 ENCOUNTER — Ambulatory Visit (INDEPENDENT_AMBULATORY_CARE_PROVIDER_SITE_OTHER): Payer: BC Managed Care – PPO | Admitting: Physical Therapy

## 2020-01-07 DIAGNOSIS — M6281 Muscle weakness (generalized): Secondary | ICD-10-CM | POA: Diagnosis not present

## 2020-01-07 DIAGNOSIS — R600 Localized edema: Secondary | ICD-10-CM

## 2020-01-07 DIAGNOSIS — M25661 Stiffness of right knee, not elsewhere classified: Secondary | ICD-10-CM

## 2020-01-07 DIAGNOSIS — R2689 Other abnormalities of gait and mobility: Secondary | ICD-10-CM | POA: Diagnosis not present

## 2020-01-07 DIAGNOSIS — M25561 Pain in right knee: Secondary | ICD-10-CM

## 2020-01-07 NOTE — Therapy (Signed)
[facility] Physical Therapy [address] [place], [place], [address] Phone: [phone]   Fax:  [phone]  Physical Therapy Treatment  Patient Details  Name: Julia [name] MRN: [id number] Date of Birth: 04-30-1966 Referring Provider (PT): Cleophas Dunker, MD   Encounter Date: 01/07/2020   PT End of Session - 01/07/20 1020    Visit Number 2    Number of Visits 23    Date for PT Re-Evaluation 03/30/20    Authorization Type BCBS    Authorization - Visit Number 2    Authorization - Number of Visits 23   30 VL used 7 in HHPT   PT Start Time 0925    PT Stop Time 1023    PT Time Calculation (min) 58 min    Activity Tolerance Patient tolerated treatment well    Behavior During Therapy [facility] for tasks assessed/performed           Past Medical History:  Diagnosis Date  . Anxiety   . Arthritis    knees, hands  . Depression     Past Surgical History:  Procedure Laterality Date  . ABDOMINAL HYSTERECTOMY    . KNEE ARTHROSCOPY    . 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   Procedure: RIGHT TOTAL KNEE ARTHROPLASTY;  Surgeon: Valeria Batman, MD;  Location: [facility];  Service: Orthopedics;  Laterality: Right;    There were no vitals filed for this visit.   Subjective Assessment - 01/07/20 1016    Subjective Knee pain overall 5/10, difficult to sleep last night    Pertinent History PMH: Rt TKA 12/14/19, anx, dep, OA    How long can you stand comfortably? 10 min    How long can you walk comfortably? grocery store    Patient Stated Goals get back to normal and surf fishing    Pain Onset 1 to 4 weeks ago             [facility] Adult PT Treatment/Exercise - 01/07/20 0001      Ambulation/Gait   Ambulation/Gait Yes    Ambulation/Gait Assistance 6: Modified independent (Device/Increase time)    Ambulation Distance (Feet) 100 Feet    Assistive device Rolling walker    Gait Pattern Step-through pattern;Decreased step length - right;Decreased step length -  left;Decreased stance time - right;Decreased stance time - left;Decreased hip/knee flexion - right      Knee/Hip Exercises: Stretches   Active Hamstring Stretch Right;3 reps;30 seconds    Other Knee/Hip Stretches seated heelslides with self OP from other leg 10 sec X 10 also LAQ resisting with other leg      Knee/Hip Exercises: Aerobic   Nustep L5 X 6 min UE/LE      Knee/Hip Exercises: Seated   Long Arc Quad Right;2 sets;10 reps    Long Arc Quad Weight 2 lbs.    Hamstring Curl Right;2 sets;10 reps    Hamstring Limitations red      Knee/Hip Exercises: Supine   Quad Sets Right;10 reps    Short Arc Quad Sets Right;2 sets;10 reps    Straight Leg Raises Right;15 reps      Modalities   Modalities Electrical Stimulation;Vasopneumatic      Programme researcher, broadcasting/film/video Location Rt knee    Electrical Stimulation Action IFC    Electrical Stimulation Parameters tolerance in supine with vaso 10 min    Electrical Stimulation Goals Pain      Vasopneumatic   Number Minutes Vasopneumatic  10 minutes    Vasopnuematic Location  Knee    Vasopneumatic Pressure Medium    Vasopneumatic Temperature  34      Manual Therapy   Manual therapy comments Rt knee PROM, STM and scar massage, extension and flexion mobs                  PT Education - 01/07/20 1020    Education Details self scar massage techniques at home    Person(s) Educated Patient    Methods Explanation    Comprehension Verbalized understanding               PT Long Term Goals - 01/06/20 1338      PT LONG TERM GOAL #1   Title Pt will be I and compliant with HEP.    Time 12    Period Weeks    Status New    Target Date 03/30/20      PT LONG TERM GOAL #2   Title Pt will improve Rt knee ROM 3-110 degrees    Time 12    Period Weeks    Status New      PT LONG TERM GOAL #3   Title Pt will improve Rt knee strength to 5/5 MMT to improve function    Time 12    Period Weeks    Status New        PT LONG TERM GOAL #4   Title Pt will be able to ambulate community distances >500 ft independently, no AD    Time 12    Period Weeks    Status New                 Plan - 01/07/20 1034    Clinical Impression Statement She appeared to have more knee extension ROM after session. Overall fair to good tolerance to exercises and manual stretching today to increase her knee strength and ROM. Used vaso and TENS at end of session for edema and pain control.    Personal Factors and Comorbidities Comorbidity 3+    Comorbidities PMH: Rt TKA 12/14/19, anx, dep, OA    Examination-Activity Limitations Locomotion Level;Transfers;Carry;Sit;Sleep;Squat;Stairs;Stand;Lift    Examination-Participation Restrictions Cleaning;Driving;Community Activity;Shop;Laundry    Stability/Clinical Decision Making Stable/Uncomplicated    Rehab Potential Good    PT Frequency 2x / week   2-3   PT Duration 12 weeks    PT Treatment/Interventions Aquatic Therapy;Cryotherapy;Electrical Stimulation;Iontophoresis 4mg /ml Dexamethasone;Moist Heat;Ultrasound;Gait training;Stair training;Functional mobility training;Therapeutic activities;Therapeutic exercise;Balance training;Neuromuscular re-education;Manual techniques;Passive range of motion;Dry needling;Scar mobilization;Joint Manipulations;Vasopneumatic Device;Taping    PT Next Visit Plan review and update HEP PRN, needs knee ROM and gentle strength, try nu step, likes vaso    PT Home Exercise Plan Access Code: [email or web address]    Consulted and Agree with Plan of Care Patient           Patient will benefit from skilled therapeutic intervention in order to improve the following deficits and impairments:  Abnormal gait, Decreased activity tolerance, Decreased balance, Decreased endurance, Decreased mobility, Decreased range of motion, Decreased strength, Difficulty walking, Hypomobility, Increased edema, Increased fascial restricitons, Pain  Visit Diagnosis: Acute pain of right  knee  Stiffness of right knee, not elsewhere classified  Muscle weakness (generalized)  Other abnormalities of gait and mobility  Localized edema     Problem List Patient Active Problem List   Diagnosis Date Noted  . Total knee replacement status, right 12/14/2019  . Class 2 obesity due to excess calories without serious comorbidity with body mass index (BMI) of 39.0 to 39.9 in  adult 11/05/2019  . Unilateral primary osteoarthritis, right knee 11/05/2019  . Unilateral primary osteoarthritis, left knee 11/05/2019  . Bilateral primary osteoarthritis of knee 01/25/2019    April Manson, PT,DPT 01/07/2020, 10:36 AM  [facility] Physical Therapy [address] [place], [place], [address] Phone: [phone]   Fax:  [phone]  Name: Julia [name] MRN: [id number] Date of Birth: Sep 28, 1965

## 2020-01-11 ENCOUNTER — Encounter: Payer: BC Managed Care – PPO | Admitting: Physical Therapy

## 2020-01-12 ENCOUNTER — Other Ambulatory Visit: Payer: Self-pay

## 2020-01-12 ENCOUNTER — Encounter: Payer: Self-pay | Admitting: Physical Therapy

## 2020-01-12 ENCOUNTER — Ambulatory Visit (INDEPENDENT_AMBULATORY_CARE_PROVIDER_SITE_OTHER): Payer: BC Managed Care – PPO | Admitting: Physical Therapy

## 2020-01-12 ENCOUNTER — Other Ambulatory Visit: Payer: Self-pay | Admitting: Orthopaedic Surgery

## 2020-01-12 DIAGNOSIS — M25561 Pain in right knee: Secondary | ICD-10-CM

## 2020-01-12 DIAGNOSIS — R600 Localized edema: Secondary | ICD-10-CM

## 2020-01-12 DIAGNOSIS — M6281 Muscle weakness (generalized): Secondary | ICD-10-CM | POA: Diagnosis not present

## 2020-01-12 DIAGNOSIS — M25661 Stiffness of right knee, not elsewhere classified: Secondary | ICD-10-CM | POA: Diagnosis not present

## 2020-01-12 DIAGNOSIS — R2689 Other abnormalities of gait and mobility: Secondary | ICD-10-CM | POA: Diagnosis not present

## 2020-01-12 MED ORDER — HYDROCODONE-ACETAMINOPHEN 5-325 MG PO TABS: 1.0000 | ORAL_TABLET | Freq: Four times a day (QID) | ORAL | 0 refills | Status: DC | PRN

## 2020-01-12 NOTE — Therapy (Signed)
[facility] Physical Therapy [address] [place], [place], [address] Phone: [phone]   Fax:  [phone]  Physical Therapy Treatment  Patient Details  Name: Julia [name] MRN: [id number] Date of Birth: 08-18-1965 Referring Provider (PT): Cleophas Dunker, MD   Encounter Date: 01/12/2020   PT End of Session - 01/12/20 0849    Visit Number 3    Number of Visits 23    Date for PT Re-Evaluation 03/30/20    Authorization Type BCBS    Authorization - Visit Number 3    Authorization - Number of Visits 23   30 VL used 7 in HHPT   PT Start Time 0802    PT Stop Time 0853    PT Time Calculation (min) 51 min    Activity Tolerance Patient tolerated treatment well    Behavior During Therapy [facility] for tasks assessed/performed           Past Medical History:  Diagnosis Date   Anxiety    Arthritis    knees, hands   Depression     Past Surgical History:  Procedure Laterality Date   ABDOMINAL HYSTERECTOMY     KNEE ARTHROSCOPY     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   Procedure: RIGHT TOTAL KNEE ARTHROPLASTY;  Surgeon: Valeria Batman, MD;  Location: [facility];  Service: Orthopedics;  Laterality: Right;    There were no vitals filed for this visit.   Subjective Assessment - 01/12/20 0805    Subjective still having knee pain, worse at night.  using cane some at home.    Pertinent History PMH: Rt TKA 12/14/19, anx, dep, OA    How long can you stand comfortably? 10 min    How long can you walk comfortably? grocery store    Patient Stated Goals get back to normal and surf fishing    Currently in Pain? Yes    Pain Score 6     Pain Location Knee    Pain Orientation Right    Pain Descriptors / Indicators Aching;Throbbing;Stabbing    Pain Type Surgical pain    Pain Onset 1 to 4 weeks ago    Pain Frequency Constant    Aggravating Factors  sitting, sleeping, stairs    Pain Relieving Factors medicine, ice              [facility] PT Assessment - 01/12/20 0832        AROM   Right Knee Extension 4    Right Knee Flexion 92                         [facility] Adult PT Treatment/Exercise - 01/12/20 0806      Ambulation/Gait   Ambulation/Gait Yes    Ambulation/Gait Assistance 5: Supervision    Ambulation Distance (Feet) 150 Feet    Assistive device Straight cane    Gait Pattern Step-through pattern;Decreased step length - right;Decreased step length - left;Decreased stance time - right;Decreased stance time - left;Decreased hip/knee flexion - right    Ramp 5: Supervision    Ramp Details (indicate cue type and reason) with SPC    Curb 5: Supervision    Curb Details (indicate cue type and reason) with SPC and min cues for sequencing      Knee/Hip Exercises: Stretches   Passive Hamstring Stretch Right;3 reps;30 seconds    Passive Hamstring Stretch Limitations supine with strap; overpressure at distal thigh      Knee/Hip Exercises: Aerobic   Nustep  L5 X 6 min UE/LE      Knee/Hip Exercises: Machines for Strengthening   Cybex Knee Extension 5# 3x10 focus on using RLE      Knee/Hip Exercises: Supine   Heel Slides Right;10 reps;AAROM      Modalities   Modalities Electrical Stimulation;Vasopneumatic      Electrical Stimulation   Electrical Stimulation Location Rt knee    Electrical Stimulation Action IFC    Electrical Stimulation Parameters to tolerance with vaso    Electrical Stimulation Goals Pain      Vasopneumatic   Number Minutes Vasopneumatic  10 minutes    Vasopnuematic Location  Knee    Vasopneumatic Pressure Medium    Vasopneumatic Temperature  34      Manual Therapy   Manual therapy comments Rt knee extension in supine                       PT Long Term Goals - 01/06/20 1338      PT LONG TERM GOAL #1   Title Pt will be I and compliant with HEP.    Time 12    Period Weeks    Status New    Target Date 03/30/20      PT LONG TERM GOAL #2   Title Pt will improve Rt knee ROM 3-110 degrees    Time 12     Period Weeks    Status New      PT LONG TERM GOAL #3   Title Pt will improve Rt knee strength to 5/5 MMT to improve function    Time 12    Period Weeks    Status New      PT LONG TERM GOAL #4   Title Pt will be able to ambulate community distances >500 ft independently, no AD    Time 12    Period Weeks    Status New                 Plan - 01/12/20 0850    Clinical Impression Statement Pt demonstrating improvements in ROM and functional mobility and feel she can progress to [facility] for household and short distances.  Will continue to benefit from PT to maximize function.    Personal Factors and Comorbidities Comorbidity 3+    Comorbidities PMH: Rt TKA 12/14/19, anx, dep, OA    Examination-Activity Limitations Locomotion Level;Transfers;Carry;Sit;Sleep;Squat;Stairs;Stand;Lift    Examination-Participation Restrictions Cleaning;Driving;Community Activity;Shop;Laundry    Stability/Clinical Decision Making Stable/Uncomplicated    Rehab Potential Good    PT Frequency 2x / week   2-3   PT Duration 12 weeks    PT Treatment/Interventions Aquatic Therapy;Cryotherapy;Electrical Stimulation;Iontophoresis 4mg /ml Dexamethasone;Moist Heat;Ultrasound;Gait training;Stair training;Functional mobility training;Therapeutic activities;Therapeutic exercise;Balance training;Neuromuscular re-education;Manual techniques;Passive range of motion;Dry needling;Scar mobilization;Joint Manipulations;Vasopneumatic Device;Taping    PT Next Visit Plan review and update HEP PRN, needs knee ROM and gentle strength, try nu step, likes vaso    PT Home Exercise Plan Access Code: [email or web address]    Consulted and Agree with Plan of Care Patient           Patient will benefit from skilled therapeutic intervention in order to improve the following deficits and impairments:  Abnormal gait, Decreased activity tolerance, Decreased balance, Decreased endurance, Decreased mobility, Decreased range of motion, Decreased strength,  Difficulty walking, Hypomobility, Increased edema, Increased fascial restricitons, Pain  Visit Diagnosis: Acute pain of right knee  Stiffness of right knee, not elsewhere classified  Muscle weakness (generalized)  Other abnormalities of gait and  mobility  Localized edema     Problem List Patient Active Problem List   Diagnosis Date Noted   Total knee replacement status, right 12/14/2019   Class 2 obesity due to excess calories without serious comorbidity with body mass index (BMI) of 39.0 to 39.9 in adult 11/05/2019   Unilateral primary osteoarthritis, right knee 11/05/2019   Unilateral primary osteoarthritis, left knee 11/05/2019   Bilateral primary osteoarthritis of knee 01/25/2019      Julia [name], PT, DPT 01/12/20 9:31 AM    [facility] Physical Therapy [address] [place], [place], [address] Phone: [phone]   Fax:  [phone]  Name: Julia [name] MRN: [id number] Date of Birth: [birth year]

## 2020-01-12 NOTE — Telephone Encounter (Signed)
Prescribed hydrocodone via imprivata

## 2020-01-13 ENCOUNTER — Encounter: Payer: BC Managed Care – PPO | Admitting: Physical Therapy

## 2020-01-19 ENCOUNTER — Encounter: Payer: Self-pay | Admitting: Physical Therapy

## 2020-01-19 ENCOUNTER — Ambulatory Visit (INDEPENDENT_AMBULATORY_CARE_PROVIDER_SITE_OTHER): Payer: 59 | Admitting: Physical Therapy

## 2020-01-19 ENCOUNTER — Other Ambulatory Visit: Payer: Self-pay

## 2020-01-19 DIAGNOSIS — M25661 Stiffness of right knee, not elsewhere classified: Secondary | ICD-10-CM

## 2020-01-19 DIAGNOSIS — M6281 Muscle weakness (generalized): Secondary | ICD-10-CM

## 2020-01-19 DIAGNOSIS — M25561 Pain in right knee: Secondary | ICD-10-CM

## 2020-01-19 DIAGNOSIS — R2689 Other abnormalities of gait and mobility: Secondary | ICD-10-CM

## 2020-01-19 DIAGNOSIS — R600 Localized edema: Secondary | ICD-10-CM

## 2020-01-19 NOTE — Therapy (Signed)
[facility] Physical Therapy [address] [place], [place], [address] Phone: [phone]   Fax:  [phone]  Physical Therapy Treatment  Patient Details  Name: Julia [name] MRN: [id number] Date of Birth: 02-04-1966 Referring Provider (PT): Cleophas Dunker, MD   Encounter Date: 01/19/2020   PT End of Session - 01/19/20 1538    Visit Number 3   did not count visit today as no charge due to insurance change   Number of Visits 23    Date for PT Re-Evaluation 03/30/20    Authorization Type BCBS    Authorization - Visit Number 3    Authorization - Number of Visits 23   30 VL used 7 in HHPT   PT Start Time 1430    PT Stop Time 1520    PT Time Calculation (min) 50 min    Activity Tolerance Patient tolerated treatment well    Behavior During Therapy [facility] for tasks assessed/performed           Past Medical History:  Diagnosis Date  . Anxiety   . Arthritis    knees, hands  . Depression     Past Surgical History:  Procedure Laterality Date  . ABDOMINAL HYSTERECTOMY    . KNEE ARTHROSCOPY    . 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   Procedure: RIGHT TOTAL KNEE ARTHROPLASTY;  Surgeon: Valeria Batman, MD;  Location: [facility];  Service: Orthopedics;  Laterality: Right;    There were no vitals filed for this visit.   Subjective Assessment - 01/19/20 1536    Subjective knee feels a lot better, is walking better and doing all of her exercises.    Pertinent History PMH: Rt TKA 12/14/19, anx, dep, OA    How long can you stand comfortably? 10 min    How long can you walk comfortably? grocery store    Patient Stated Goals get back to normal and surf fishing    Pain Onset 1 to 4 weeks ago              [facility] PT Assessment - 01/19/20 0001      Assessment   Medical Diagnosis Rt TKA    Referring Provider (PT) Cleophas Dunker, MD                         [facility] Adult PT Treatment/Exercise - 01/19/20 0001      Ambulation/Gait   Ambulation/Gait Yes     Ambulation/Gait Assistance 5: Supervision    Ambulation Distance (Feet) 150 Feet    Assistive device None      Neuro Re-ed    Neuro Re-ed Details  in bars for march walking, retro walking, sidestepping without UE support      Knee/Hip Exercises: Stretches   Passive Hamstring Stretch Right;3 reps;30 seconds    Passive Hamstring Stretch Limitations seeated    Knee: Self-Stretch Limitations standing lunge stretch for knee flexion on 6 inch step 10 sec X 15 reps      Knee/Hip Exercises: Aerobic   Recumbent Bike rocking for 2 minutes then able to perform full revolutions for 6 more minutes      Knee/Hip Exercises: Machines for Strengthening   Cybex Leg Press double leg press 62 lbs 2X10 then dropped to 31 lbs for Rt leg only 2X10      Knee/Hip Exercises: Standing   Forward Step Up Right;10 reps;Hand Hold: 0;Step Height: 6"      Knee/Hip Exercises: Seated   Sit to Sand 10  reps;without UE support      Vasopneumatic   Number Minutes Vasopneumatic  10 minutes    Vasopnuematic Location  Knee    Vasopneumatic Pressure Medium    Vasopneumatic Temperature  34      Manual Therapy   Manual therapy comments Rt knee PROM flexion and extension with overpressure, extension mobs grade 3                       PT Long Term Goals - 01/06/20 1338      PT LONG TERM GOAL #1   Title Pt will be I and compliant with HEP.    Time 12    Period Weeks    Status New    Target Date 03/30/20      PT LONG TERM GOAL #2   Title Pt will improve Rt knee ROM 3-110 degrees    Time 12    Period Weeks    Status New      PT LONG TERM GOAL #3   Title Pt will improve Rt knee strength to 5/5 MMT to improve function    Time 12    Period Weeks    Status New      PT LONG TERM GOAL #4   Title Pt will be able to ambulate community distances >500 ft independently, no AD    Time 12    Period Weeks    Status New                 Plan - 01/19/20 1539    Clinical Impression Statement relays  her insurance has changed now that she resigned from her job so she has [employer] now which is not accepted for PT at our [employer] location. She was seen today without charges and was referred to our location at [facility] for PT which does accept her insurance. Overall is progressing well and showed improvments today in strenght, ROM, gait, balance, and activity tolerance.    Personal Factors and Comorbidities Comorbidity 3+    Comorbidities PMH: Rt TKA 12/14/19, anx, dep, OA    Examination-Activity Limitations Locomotion Level;Transfers;Carry;Sit;Sleep;Squat;Stairs;Stand;Lift    Examination-Participation Restrictions Cleaning;Driving;Community Activity;Shop;Laundry    Stability/Clinical Decision Making Stable/Uncomplicated    Rehab Potential Good    PT Frequency 2x / week   2-3   PT Duration 12 weeks    PT Treatment/Interventions Aquatic Therapy;Cryotherapy;Electrical Stimulation;Iontophoresis 4mg /ml Dexamethasone;Moist Heat;Ultrasound;Gait training;Stair training;Functional mobility training;Therapeutic activities;Therapeutic exercise;Balance training;Neuromuscular re-education;Manual techniques;Passive range of motion;Dry needling;Scar mobilization;Joint Manipulations;Vasopneumatic Device;Taping    PT Next Visit Plan progress gait, balance, strength and ROM as able, she really likes vaso    PT Home Exercise Plan Access Code: [email or web address]    Consulted and Agree with Plan of Care Patient           Patient will benefit from skilled therapeutic intervention in order to improve the following deficits and impairments:  Abnormal gait, Decreased activity tolerance, Decreased balance, Decreased endurance, Decreased mobility, Decreased range of motion, Decreased strength, Difficulty walking, Hypomobility, Increased edema, Increased fascial restricitons, Pain  Visit Diagnosis: Acute pain of right knee  Stiffness of right knee, not elsewhere classified  Muscle weakness (generalized)  Other  abnormalities of gait and mobility  Localized edema     Problem List Patient Active Problem List   Diagnosis Date Noted  . Total knee replacement status, right 12/14/2019  . Class 2 obesity due to excess calories without serious comorbidity with body mass index (BMI) of 39.0 to  39.9 in adult 11/05/2019  . Unilateral primary osteoarthritis, right knee 11/05/2019  . Unilateral primary osteoarthritis, left knee 11/05/2019  . Bilateral primary osteoarthritis of knee 01/25/2019    Birdie Riddle 01/19/2020, 3:40 PM  [facility] Physical Therapy [address] [place], [place], [address] Phone: [phone]   Fax:  [phone]  Name: JADIA CAPERS MRN: [id number] Date of Birth: Sep 27, 1965

## 2020-01-20 ENCOUNTER — Other Ambulatory Visit: Payer: Self-pay | Admitting: Orthopaedic Surgery

## 2020-01-20 ENCOUNTER — Ambulatory Visit: Payer: Managed Care, Other (non HMO) | Attending: Family Medicine | Admitting: Physical Therapy

## 2020-01-20 ENCOUNTER — Encounter: Payer: Self-pay | Admitting: Physical Therapy

## 2020-01-20 ENCOUNTER — Other Ambulatory Visit: Payer: Self-pay

## 2020-01-20 DIAGNOSIS — M6281 Muscle weakness (generalized): Secondary | ICD-10-CM | POA: Diagnosis present

## 2020-01-20 DIAGNOSIS — R600 Localized edema: Secondary | ICD-10-CM

## 2020-01-20 DIAGNOSIS — M25561 Pain in right knee: Secondary | ICD-10-CM | POA: Diagnosis present

## 2020-01-20 DIAGNOSIS — R2689 Other abnormalities of gait and mobility: Secondary | ICD-10-CM | POA: Diagnosis present

## 2020-01-20 DIAGNOSIS — M25661 Stiffness of right knee, not elsewhere classified: Secondary | ICD-10-CM | POA: Diagnosis present

## 2020-01-20 MED ORDER — HYDROCODONE-ACETAMINOPHEN 5-325 MG PO TABS: 1.0000 | ORAL_TABLET | Freq: Four times a day (QID) | ORAL | 0 refills | Status: DC | PRN

## 2020-01-20 NOTE — Telephone Encounter (Signed)
Called and spoke with patient. Explained that Hydrodone had been refilled, but the alert was sent to myself and Dr.Whitfield. I denied the script because I am not a physician and Dr.Whitfield had already refilled it prior to me getting the request. She understands.

## 2020-01-20 NOTE — Telephone Encounter (Signed)
Whitfield patient  

## 2020-01-20 NOTE — Therapy (Signed)
[facility] Outpatient Rehabilitation [facility] [address] [place], [place], [address] Phone: [phone]   Fax:  [phone]  Physical Therapy Treatment  Patient Details  Name: Julia [name] MRN: [id number] Date of Birth: 1965/10/19 Referring Provider (PT): Cleophas Dunker, MD   Encounter Date: 01/20/2020   PT End of Session - 01/20/20 0827    Visit Number 4    Number of Visits 23    Date for PT Re-Evaluation 03/30/20    Authorization Type BCBS    Authorization - Visit Number 4    Authorization - Number of Visits 23    PT Start Time 0830    PT Stop Time 0925    PT Time Calculation (min) 55 min    Activity Tolerance Patient tolerated treatment well    Behavior During Therapy [facility] for tasks assessed/performed           Past Medical History:  Diagnosis Date  . Anxiety   . Arthritis    knees, hands  . Depression     Past Surgical History:  Procedure Laterality Date  . ABDOMINAL HYSTERECTOMY    . KNEE ARTHROSCOPY    . 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   Procedure: RIGHT TOTAL KNEE ARTHROPLASTY;  Surgeon: Valeria Batman, MD;  Location: [facility];  Service: Orthopedics;  Laterality: Right;    There were no vitals filed for this visit.   Subjective Assessment - 01/20/20 0826    Subjective Patient reports she is very sore from yesterday's therapy and she had trouble sleeping last night.    Patient Stated Goals Get back to normal and surf fishing    Currently in Pain? Yes    Pain Score 7     Pain Location Knee    Pain Orientation Right    Pain Descriptors / Indicators Aching;Sore    Pain Type Surgical pain    Pain Onset More than a month ago    Pain Frequency Constant              [facility] PT Assessment - 01/20/20 0001      Assessment   Medical Diagnosis Rt TKA    Referring Provider (PT) Cleophas Dunker, MD      AROM   Right Knee Extension 10    Right Knee Flexion 105                         [facility] Adult PT Treatment/Exercise -  01/20/20 0001      Knee/Hip Exercises: Stretches   Passive Hamstring Stretch 2 reps;30 seconds    Passive Hamstring Stretch Limitations supine PROM and seated with pressure light pressure over knee    Hip Flexor Stretch 2 reps;30 seconds    Hip Flexor Stretch Limitations supine edge of mat PROM    Knee: Self-Stretch Limitations Reviewed standing lunge stretch on step and supine heel slides with strap    Gastroc Stretch 2 reps;30 seconds    Gastroc Stretch Limitations standing at counter      Knee/Hip Exercises: Aerobic   Nustep L4 x 6 min (UE/LE)      Knee/Hip Exercises: Supine   Quad Sets 10 reps   5 sec   Quad Sets Limitations towel roll under knee    Straight Leg Raises 10 reps      Modalities   Modalities Vasopneumatic      Vasopneumatic   Number Minutes Vasopneumatic  10 minutes    Vasopnuematic Location  Knee    Vasopneumatic  Pressure Medium    Vasopneumatic Temperature  34      Manual Therapy   Manual Therapy Joint mobilization;Passive ROM    Joint Mobilization Patellar and knee mobs in supine and seated position    Passive ROM Knee extension in supine, knee flexion and supine and seated                  PT Education - 01/20/20 0827    Education Details HEP    Person(s) Educated Patient    Methods Explanation;Verbal cues;Demonstration    Comprehension Verbalized understanding;Returned demonstration;Need further instruction;Verbal cues required               PT Long Term Goals - 01/06/20 1338      PT LONG TERM GOAL #1   Title Pt will be I and compliant with HEP.    Time 12    Period Weeks    Status New    Target Date 03/30/20      PT LONG TERM GOAL #2   Title Pt will improve Rt knee ROM 3-110 degrees    Time 12    Period Weeks    Status New      PT LONG TERM GOAL #3   Title Pt will improve Rt knee strength to 5/5 MMT to improve function    Time 12    Period Weeks    Status New      PT LONG TERM GOAL #4   Title Pt will be able to  ambulate community distances >500 ft independently, no AD    Time 12    Period Weeks    Status New                 Plan - 01/20/20 3976    Clinical Impression Statement Patient tolerated therapy well with no adverse effects. She reported increased soreness this visit so focused primarily on manual therapy and stretching to improve range of motion and reduce pain. She does demonstrate improved knee flexion but continues to lack knee extension. Updated stretching exercises for home and she was encouraged to continue walking, performing strengthening as tolerated. She would benefit from continued skilled PT to progress motion and strength to return to previous level of function.    PT Treatment/Interventions Aquatic Therapy;Cryotherapy;Electrical Stimulation;Iontophoresis 4mg /ml Dexamethasone;Moist Heat;Ultrasound;Gait training;Stair training;Functional mobility training;Therapeutic activities;Therapeutic exercise;Balance training;Neuromuscular re-education;Manual techniques;Passive range of motion;Dry needling;Scar mobilization;Joint Manipulations;Vasopneumatic Device;Taping    PT Next Visit Plan Assess HEP and progress PRN, continue manual and stretching for knee motion, progress strengthening and add to HEP as tolerated, vaso    PT Home Exercise Plan [email or web address]: supine heel slides with strap, supine hip flexor/quad stretch with strap, seated hamstring stretch, seated knee AROM heel slide, standing lunge knee flexion stretch, standing calf stretch    Consulted and Agree with Plan of Care Patient           Patient will benefit from skilled therapeutic intervention in order to improve the following deficits and impairments:  Abnormal gait, Decreased activity tolerance, Decreased balance, Decreased endurance, Decreased mobility, Decreased range of motion, Decreased strength, Difficulty walking, Hypomobility, Increased edema, Increased fascial restricitons, Pain  Visit Diagnosis: Acute pain of  right knee  Stiffness of right knee, not elsewhere classified  Muscle weakness (generalized)  Other abnormalities of gait and mobility  Localized edema     Problem List Patient Active Problem List   Diagnosis Date Noted  . Total knee replacement status, right 12/14/2019  . Class 2 obesity  due to excess calories without serious comorbidity with body mass index (BMI) of 39.0 to 39.9 in adult 11/05/2019  . Unilateral primary osteoarthritis, right knee 11/05/2019  . Unilateral primary osteoarthritis, left knee 11/05/2019  . Bilateral primary osteoarthritis of knee 01/25/2019    Rosana Hoes, PT, DPT, LAT, ATC 01/20/20  9:39 AM Phone: [phone] Fax: [phone]   [facility] Outpatient Rehabilitation [facility] [address] [place], [place], [address] Phone: [phone]   Fax:  [phone]  Name: Julia [name] MRN: [id number] Date of Birth: 12-10-1965

## 2020-01-20 NOTE — Telephone Encounter (Signed)
sent 

## 2020-01-20 NOTE — Telephone Encounter (Signed)
Please see below.

## 2020-01-20 NOTE — Telephone Encounter (Signed)
PW pt 

## 2020-01-21 ENCOUNTER — Encounter: Payer: BC Managed Care – PPO | Admitting: Physical Therapy

## 2020-01-26 ENCOUNTER — Encounter: Payer: BC Managed Care – PPO | Admitting: Physical Therapy

## 2020-01-28 ENCOUNTER — Other Ambulatory Visit: Payer: Self-pay

## 2020-01-28 ENCOUNTER — Ambulatory Visit: Payer: Managed Care, Other (non HMO) | Admitting: Physical Therapy

## 2020-01-28 ENCOUNTER — Encounter: Payer: Self-pay | Admitting: Physical Therapy

## 2020-01-28 ENCOUNTER — Encounter: Payer: BC Managed Care – PPO | Admitting: Physical Therapy

## 2020-01-28 DIAGNOSIS — M6281 Muscle weakness (generalized): Secondary | ICD-10-CM

## 2020-01-28 DIAGNOSIS — R600 Localized edema: Secondary | ICD-10-CM

## 2020-01-28 DIAGNOSIS — M25561 Pain in right knee: Secondary | ICD-10-CM

## 2020-01-28 DIAGNOSIS — R2689 Other abnormalities of gait and mobility: Secondary | ICD-10-CM

## 2020-01-28 DIAGNOSIS — M25661 Stiffness of right knee, not elsewhere classified: Secondary | ICD-10-CM

## 2020-01-28 NOTE — Therapy (Signed)
[facility] [address] [place], [place], [address] Phone: [phone]   Fax:  [phone]  Physical Therapy Treatment  Patient Details  Name: Julia [name] MRN: [id number] Date of Birth: Sep 14, 1965 Referring Provider (PT): Cleophas Dunker, MD   Encounter Date: 01/28/2020   PT End of Session - 01/28/20 1022    Visit Number 5    Number of Visits 23    Date for PT Re-Evaluation 03/30/20    Authorization Type BCBS    Authorization - Visit Number 5    Authorization - Number of Visits 23    PT Start Time 1016    PT Stop Time 1100    PT Time Calculation (min) 44 min           Past Medical History:  Diagnosis Date  . Anxiety   . Arthritis    knees, hands  . Depression     Past Surgical History:  Procedure Laterality Date  . ABDOMINAL HYSTERECTOMY    . KNEE ARTHROSCOPY    . 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   Procedure: RIGHT TOTAL KNEE ARTHROPLASTY;  Surgeon: Valeria Batman, MD;  Location: [facility];  Service: Orthopedics;  Laterality: Right;    There were no vitals filed for this visit.   Subjective Assessment - 01/28/20 1021    Subjective The other knee bothers me. I could not come last visit so I feel like I am not progressing. I do my exercises but I am limited my left knee pain.    Currently in Pain? Yes    Pain Score 3     Pain Location Knee    Pain Orientation Right    Pain Descriptors / Indicators Aching;Sore    Pain Type Surgical pain              [facility] PT Assessment - 01/28/20 0001      AROM   Right Knee Extension 10    Right Knee Flexion 105                         [facility] Adult PT Treatment/Exercise - 01/28/20 0001      Knee/Hip Exercises: Stretches   Active Hamstring Stretch Right;3 reps;30 seconds      Knee/Hip Exercises: Aerobic   Nustep L4 x 6 min (UE/LE)      Knee/Hip Exercises: Standing   Lateral Step Up 10 reps;Hand Hold: 1;Step Height: 6"    Forward Step Up  Right;10 reps;Hand Hold: 0;Step Height: 6"    Forward Step Up Limitations also 8 inch x 10       Knee/Hip Exercises: Seated   Long Arc Quad Right;2 sets;10 reps    Sit to [employer] 10 reps;without UE support   2 sets     Knee/Hip Exercises: Supine   Quad Sets 20 reps    Quad Sets Limitations seated with heel on floor for visual feedback     Heel Slides Right;10 reps;AAROM      Vasopneumatic   Number Minutes Vasopneumatic  --   declined     Manual Therapy   Joint Mobilization patella mobs                        PT Long Term Goals - 01/06/20 1338      PT LONG TERM GOAL #1   Title Pt will be I and compliant with HEP.    Time 12  Period Weeks    Status New    Target Date 03/30/20      PT LONG TERM GOAL #2   Title Pt will improve Rt knee ROM 3-110 degrees    Time 12    Period Weeks    Status New      PT LONG TERM GOAL #3   Title Pt will improve Rt knee strength to 5/5 MMT to improve function    Time 12    Period Weeks    Status New      PT LONG TERM GOAL #4   Title Pt will be able to ambulate community distances >500 ft independently, no AD    Time 12    Period Weeks    Status New                 Plan - 01/28/20 1124    Clinical Impression Statement Pt reports left knee is more bothersome. She is wearing a left knee brace and leaves SPC in lobby.    PT Next Visit Plan Assess HEP and progress PRN, continue manual and stretching for knee motion, progress strengthening and add to HEP as tolerated, vaso    PT Home Exercise Plan [id number]: supine heel slides with strap, supine hip flexor/quad stretch with strap, seated hamstring stretch, seated knee AROM heel slide, standing lunge knee flexion stretch, standing calf stretch           Patient will benefit from skilled therapeutic intervention in order to improve the following deficits and impairments:  Abnormal gait, Decreased activity tolerance, Decreased balance, Decreased endurance, Decreased  mobility, Decreased range of motion, Decreased strength, Difficulty walking, Hypomobility, Increased edema, Increased fascial restricitons, Pain  Visit Diagnosis: Acute pain of right knee  Stiffness of right knee, not elsewhere classified  Muscle weakness (generalized)  Other abnormalities of gait and mobility  Localized edema     Problem List Patient Active Problem List   Diagnosis Date Noted  . Total knee replacement status, right 12/14/2019  . Class 2 obesity due to excess calories without serious comorbidity with body mass index (BMI) of 39.0 to 39.9 in adult 11/05/2019  . Unilateral primary osteoarthritis, right knee 11/05/2019  . Unilateral primary osteoarthritis, left knee 11/05/2019  . Bilateral primary osteoarthritis of knee 01/25/2019    Sherrie Mustache, PTA 01/28/2020, 11:31 AM  [facility] [address] [place], [place], [address] Phone: [phone]   Fax:  [phone]  Name: TIYANA GALLA MRN: [id number] Date of Birth: 10/08/1965

## 2020-02-02 ENCOUNTER — Encounter: Payer: Self-pay | Admitting: Physical Therapy

## 2020-02-02 ENCOUNTER — Encounter: Payer: BC Managed Care – PPO | Admitting: Physical Therapy

## 2020-02-02 ENCOUNTER — Other Ambulatory Visit: Payer: Self-pay

## 2020-02-02 ENCOUNTER — Ambulatory Visit: Payer: Managed Care, Other (non HMO) | Admitting: Physical Therapy

## 2020-02-02 DIAGNOSIS — M25561 Pain in right knee: Secondary | ICD-10-CM | POA: Diagnosis not present

## 2020-02-02 DIAGNOSIS — M6281 Muscle weakness (generalized): Secondary | ICD-10-CM

## 2020-02-02 DIAGNOSIS — R600 Localized edema: Secondary | ICD-10-CM

## 2020-02-02 DIAGNOSIS — R2689 Other abnormalities of gait and mobility: Secondary | ICD-10-CM

## 2020-02-02 DIAGNOSIS — M25661 Stiffness of right knee, not elsewhere classified: Secondary | ICD-10-CM

## 2020-02-02 NOTE — Therapy (Signed)
[facility] [address] [place], [place], [address] Phone: [phone]   Fax:  [phone]  Physical Therapy Treatment  Patient Details  Name: Julia [name] MRN: [id number] Date of Birth: 02/24/1966 Referring Provider (PT): Cleophas Dunker, MD   Encounter Date: 02/02/2020   PT End of Session - 02/02/20 1022    Visit Number 6    Number of Visits 23    Date for PT Re-Evaluation 03/30/20    Authorization Type BCBS    Authorization - Visit Number 6    Authorization - Number of Visits 23    PT Start Time 1018    PT Stop Time 1115    PT Time Calculation (min) 57 min           Past Medical History:  Diagnosis Date  . Anxiety   . Arthritis    knees, hands  . Depression     Past Surgical History:  Procedure Laterality Date  . ABDOMINAL HYSTERECTOMY    . KNEE ARTHROSCOPY    . 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   Procedure: RIGHT TOTAL KNEE ARTHROPLASTY;  Surgeon: Valeria Batman, MD;  Location: [facility];  Service: Orthopedics;  Laterality: Right;    There were no vitals filed for this visit.   Subjective Assessment - 02/02/20 1021    Subjective Right knee is okay 1/10. I am dealing with the left knee.    Currently in Pain? Yes    Pain Score 1     Pain Location Knee    Pain Orientation Right    Pain Descriptors / Indicators Aching;Sore    Pain Type Surgical pain    Aggravating Factors  sleeping/position    Pain Relieving Factors keep repositioning, meds, ice              [facility] PT Assessment - 02/02/20 0001      AROM   Right Knee Flexion 107                         [facility] Adult PT Treatment/Exercise - 02/02/20 0001      Knee/Hip Exercises: Stretches   Knee: Self-Stretch Limitations standing lunge stretch for knee flexion on 6 inch step 10 sec X 15 reps   also hamstring stretch at step      Knee/Hip Exercises: Aerobic   Nustep L5 x 7 min, LE       Knee/Hip Exercises: Machines for  Strengthening   Cybex Leg Press bilateral leg press 2 plates then singel leg 1 plate       Knee/Hip Exercises: Seated   Long Arc Quad 10 reps   10 sec holds   Long Arc Quad Weight 5 lbs.    Sit to Sand 10 reps;without UE support   2 sets     Knee/Hip Exercises: Supine   Straight Leg Raises 10 reps      Vasopneumatic   Number Minutes Vasopneumatic  10 minutes    Vasopnuematic Location  Knee    Vasopneumatic Pressure Medium    Vasopneumatic Temperature  34      Manual Therapy   Joint Mobilization patella mobs                        PT Long Term Goals - 01/06/20 1338      PT LONG TERM GOAL #1   Title Pt will be I and compliant with HEP.    Time 12  Period Weeks    Status New    Target Date 03/30/20      PT LONG TERM GOAL #2   Title Pt will improve Rt knee ROM 3-110 degrees    Time 12    Period Weeks    Status New      PT LONG TERM GOAL #3   Title Pt will improve Rt knee strength to 5/5 MMT to improve function    Time 12    Period Weeks    Status New      PT LONG TERM GOAL #4   Title Pt will be able to ambulate community distances >500 ft independently, no AD    Time 12    Period Weeks    Status New                 Plan - 02/02/20 1023    Clinical Impression Statement Pt reports walking in the neighborhood for exercise. She was sore after last session. She lacks full extension knee rom. Worked on quad activation and extension stretching.    PT Next Visit Plan Assess HEP and progress PRN, continue manual and stretching for knee motion, progress strengthening and add to HEP as tolerated, vaso    PT Home Exercise Plan [id number]: supine heel slides with strap, supine hip flexor/quad stretch with strap, seated hamstring stretch, seated knee AROM heel slide, standing lunge knee flexion stretch, standing calf stretch           Patient will benefit from skilled therapeutic intervention in order to improve the following deficits and impairments:   Abnormal gait, Decreased activity tolerance, Decreased balance, Decreased endurance, Decreased mobility, Decreased range of motion, Decreased strength, Difficulty walking, Hypomobility, Increased edema, Increased fascial restricitons, Pain  Visit Diagnosis: Acute pain of right knee  Stiffness of right knee, not elsewhere classified  Muscle weakness (generalized)  Other abnormalities of gait and mobility  Localized edema     Problem List Patient Active Problem List   Diagnosis Date Noted  . Total knee replacement status, right 12/14/2019  . Class 2 obesity due to excess calories without serious comorbidity with body mass index (BMI) of 39.0 to 39.9 in adult 11/05/2019  . Unilateral primary osteoarthritis, right knee 11/05/2019  . Unilateral primary osteoarthritis, left knee 11/05/2019  . Bilateral primary osteoarthritis of knee 01/25/2019    Sherrie Mustache, PTA 02/02/2020, 11:52 AM  [facility] [address] [place], [place], [address] Phone: [phone]   Fax:  [phone]  Name: KALAYNA NOY MRN: [id number] Date of Birth: Sep 19, 1965

## 2020-02-04 ENCOUNTER — Encounter: Payer: BC Managed Care – PPO | Admitting: Physical Therapy

## 2020-02-04 ENCOUNTER — Other Ambulatory Visit: Payer: Self-pay | Admitting: Orthopaedic Surgery

## 2020-02-04 ENCOUNTER — Ambulatory Visit: Payer: Managed Care, Other (non HMO) | Admitting: Physical Therapy

## 2020-02-04 ENCOUNTER — Other Ambulatory Visit: Payer: Self-pay

## 2020-02-04 DIAGNOSIS — M25561 Pain in right knee: Secondary | ICD-10-CM

## 2020-02-04 DIAGNOSIS — R600 Localized edema: Secondary | ICD-10-CM

## 2020-02-04 DIAGNOSIS — R2689 Other abnormalities of gait and mobility: Secondary | ICD-10-CM

## 2020-02-04 DIAGNOSIS — M6281 Muscle weakness (generalized): Secondary | ICD-10-CM

## 2020-02-04 DIAGNOSIS — M25661 Stiffness of right knee, not elsewhere classified: Secondary | ICD-10-CM

## 2020-02-04 MED ORDER — HYDROCODONE-ACETAMINOPHEN 5-325 MG PO TABS: 1.0000 | ORAL_TABLET | Freq: Three times a day (TID) | ORAL | 0 refills | Status: DC | PRN

## 2020-02-04 NOTE — Telephone Encounter (Signed)
Please advise 

## 2020-02-04 NOTE — Therapy (Signed)
[facility] [address] [place], [place], [address] Phone: [phone]   Fax:  [phone]  Physical Therapy Treatment  Patient Details  Name: CHYNA KNEECE MRN: [id number] Date of Birth: [birth year] Referring Provider (PT): Cleophas Dunker, MD   Encounter Date: 02/04/2020   PT End of Session - 02/04/20 1006    Visit Number 7    Number of Visits 23    Date for PT Re-Evaluation 03/30/20    Authorization Type BCBS    Authorization - Visit Number 6    Authorization - Number of Visits 23    PT Start Time 1005    PT Stop Time 1052    PT Time Calculation (min) 47 min    Activity Tolerance Patient tolerated treatment well;No increased pain    Behavior During Therapy WFL for tasks assessed/performed           Past Medical History:  Diagnosis Date  . Anxiety   . Arthritis    knees, hands  . Depression     Past Surgical History:  Procedure Laterality Date  . ABDOMINAL HYSTERECTOMY    . KNEE ARTHROSCOPY    . 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   Procedure: RIGHT TOTAL KNEE ARTHROPLASTY;  Surgeon: Valeria Batman, MD;  Location: [facility];  Service: Orthopedics;  Laterality: Right;    There were no vitals filed for this visit.   Subjective Assessment - 02/04/20 1007    Subjective Pt reports she's been icing the right knee a lot. Pt states she's having a moment with her right knee today and having increased pain. Reports pain feels that it is in the joint itself.    Currently in Pain? Yes    Pain Score 7     Pain Location Knee    Pain Orientation Right                             [facility] Adult PT Treatment/Exercise - 02/04/20 0001      Knee/Hip Exercises: Stretches   Passive Hamstring Stretch Right;3 reps;20 seconds    Other Knee/Hip Stretches knee flexion stretch in seated x 5 with contract & release    Other Knee/Hip Stretches passive knee extension stretch on towel x 30 sec      Knee/Hip Exercises:  Aerobic   Nustep L5 x 6 min, LE      Knee/Hip Exercises: Seated   Hamstring Curl Strengthening;Right;20 reps   focus on end range motion   Hamstring Limitations green      Knee/Hip Exercises: Supine   Quad Sets --   30-15-15-15   Quad Sets Limitations BFR @ 231 LOP, worked at 150 for 65% LOP      Vasopneumatic   Number Minutes Vasopneumatic  10 minutes    Vasopnuematic Location  Knee    Vasopneumatic Pressure Medium    Vasopneumatic Temperature  34      Manual Therapy   Manual Therapy Soft tissue mobilization    Manual therapy comments gastroc/soleus complex    Joint Mobilization patella mobs; tibiofemoral flexion in seated, extension in supine grade II to III                       PT Long Term Goals - 01/06/20 1338      PT LONG TERM GOAL #1   Title Pt will be I and compliant with HEP.    Time 12  Period Weeks    Status New    Target Date 03/30/20      PT LONG TERM GOAL #2   Title Pt will improve Rt knee ROM 3-110 degrees    Time 12    Period Weeks    Status New      PT LONG TERM GOAL #3   Title Pt will improve Rt knee strength to 5/5 MMT to improve function    Time 12    Period Weeks    Status New      PT LONG TERM GOAL #4   Title Pt will be able to ambulate community distances >500 ft independently, no AD    Time 12    Period Weeks    Status New                 Plan - 02/04/20 1046    Clinical Impression Statement Pt feeling down due to her pain spiking up and down. Due to pt's increased pain, PT deferred using leg press machine and tried BFR. Pt able to tolerate without any increase in her pain. Treatment continues to focus on improving knee ROM and strength.    Personal Factors and Comorbidities Comorbidity 3+    Comorbidities PMH: Rt TKA 12/14/19, anx, dep, OA    Examination-Activity Limitations Locomotion Level;Transfers;Carry;Sit;Sleep;Squat;Stairs;Stand;Lift    Examination-Participation Restrictions Cleaning;Driving;Community  Activity;Shop;Laundry    Rehab Potential Good    PT Frequency 2x / week    PT Duration 12 weeks    PT Treatment/Interventions Aquatic Therapy;Cryotherapy;Electrical Stimulation;Iontophoresis 4mg /ml Dexamethasone;Moist Heat;Ultrasound;Gait training;Stair training;Functional mobility training;Therapeutic activities;Therapeutic exercise;Balance training;Neuromuscular re-education;Manual techniques;Passive range of motion;Dry needling;Scar mobilization;Joint Manipulations;Vasopneumatic Device;Taping    PT Next Visit Plan Assess HEP and progress PRN, continue manual and stretching for knee motion, progress strengthening and add to HEP as tolerated, vaso. Consider BFR.    PT Home Exercise Plan [id number]: supine heel slides with strap, supine hip flexor/quad stretch with strap, seated hamstring stretch, seated knee AROM heel slide, standing lunge knee flexion stretch, standing calf stretch    Consulted and Agree with Plan of Care Patient           Patient will benefit from skilled therapeutic intervention in order to improve the following deficits and impairments:  Abnormal gait, Decreased activity tolerance, Decreased balance, Decreased endurance, Decreased mobility, Decreased range of motion, Decreased strength, Difficulty walking, Hypomobility, Increased edema, Increased fascial restricitons, Pain  Visit Diagnosis: Acute pain of right knee  Stiffness of right knee, not elsewhere classified  Muscle weakness (generalized)  Other abnormalities of gait and mobility  Localized edema     Problem List Patient Active Problem List   Diagnosis Date Noted  . Total knee replacement status, right 12/14/2019  . Class 2 obesity due to excess calories without serious comorbidity with body mass index (BMI) of 39.0 to 39.9 in adult 11/05/2019  . Unilateral primary osteoarthritis, right knee 11/05/2019  . Unilateral primary osteoarthritis, left knee 11/05/2019  . Bilateral primary osteoarthritis of knee  01/25/2019    [facility] [address] [place] PT, DPT 02/04/2020, 11:52 AM  [facility] [address] [place], [place], [place] Phone: [phone]   Fax:  [phone]  Name: ARRIYAH MADEJ MRN: Criss Alvine Date of Birth: 1966/04/25

## 2020-02-04 NOTE — Telephone Encounter (Signed)
Could you please refuse this request since you have sent in refill? I do not have authorization to remove it. Thanks.

## 2020-02-04 NOTE — Telephone Encounter (Signed)
sent 

## 2020-02-09 ENCOUNTER — Ambulatory Visit: Payer: Managed Care, Other (non HMO) | Admitting: Physical Therapy

## 2020-02-09 ENCOUNTER — Other Ambulatory Visit: Payer: Self-pay

## 2020-02-09 ENCOUNTER — Encounter: Payer: BC Managed Care – PPO | Admitting: Physical Therapy

## 2020-02-09 DIAGNOSIS — R2689 Other abnormalities of gait and mobility: Secondary | ICD-10-CM

## 2020-02-09 DIAGNOSIS — M25561 Pain in right knee: Secondary | ICD-10-CM

## 2020-02-09 DIAGNOSIS — M25661 Stiffness of right knee, not elsewhere classified: Secondary | ICD-10-CM

## 2020-02-09 DIAGNOSIS — M6281 Muscle weakness (generalized): Secondary | ICD-10-CM

## 2020-02-09 DIAGNOSIS — R600 Localized edema: Secondary | ICD-10-CM

## 2020-02-09 NOTE — Therapy (Signed)
[facility] [address] [place], [place], [address] Phone: [phone]   Fax:  [phone]  Physical Therapy Treatment  Patient Details  Name: Julia [name] MRN: [id number] Date of Birth: 11-30-1965 Referring Provider (PT): Cleophas Dunker, MD   Encounter Date: 02/09/2020   PT End of Session - 02/09/20 1149    Visit Number 8    Number of Visits 23    Date for PT Re-Evaluation 03/30/20    Authorization Type [employer]    PT Start Time 1045    PT Stop Time 1145    PT Time Calculation (min) 60 min    Activity Tolerance Patient tolerated treatment well    Behavior During Therapy [facility] for tasks assessed/performed           Past Medical History:  Diagnosis Date  . Anxiety   . Arthritis    knees, hands  . Depression     Past Surgical History:  Procedure Laterality Date  . ABDOMINAL HYSTERECTOMY    . KNEE ARTHROSCOPY    . 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   Procedure: RIGHT TOTAL KNEE ARTHROPLASTY;  Surgeon: Valeria Batman, MD;  Location: [facility];  Service: Orthopedics;  Laterality: Right;    There were no vitals filed for this visit.   Subjective Assessment - 02/09/20 1150    Subjective Patient reports she is better today but continues to have increased pain overall. She feels she is using her right leg a lot due to left knee pain/limitation. She is consistent with stretching and walking at home.    Patient Stated Goals Get back to normal and surf fishing    Currently in Pain? Yes    Pain Score 5     Pain Location Knee    Pain Orientation Right    Pain Descriptors / Indicators Aching;Sore    Pain Type Surgical pain    Pain Onset More than a month ago    Pain Frequency Constant              [facility] PT Assessment - 02/09/20 0001      Assessment   Medical Diagnosis Rt TKA    Referring Provider (PT) Cleophas Dunker, MD    Onset Date/Surgical Date 12/14/19    Next MD Visit 02/17/2020      AROM   Right Knee Extension 5     Right Knee Flexion 107                         [facility] Adult PT Treatment/Exercise - 02/09/20 0001      Exercises   Exercises Knee/Hip      Knee/Hip Exercises: Stretches   Passive Hamstring Stretch 2 reps;30 seconds    Passive Hamstring Stretch Limitations supine PROM and seated with pressure light pressure over knee    Hip Flexor Stretch 2 reps;30 seconds    Hip Flexor Stretch Limitations supine edge of mat PROM    Gastroc Stretch 2 reps;30 seconds    Gastroc Stretch Limitations slant board      Knee/Hip Exercises: Aerobic   Nustep L3 x 5 min (LE only)      Knee/Hip Exercises: Standing   Heel Raises 2 sets;10 reps      Knee/Hip Exercises: Seated   Long Arc Quad 2 sets;10 reps    Long Arc Quad Weight 3 lbs.    Sit to Sand 2 sets;10 reps;without UE support   cued to keep feet even  Knee/Hip Exercises: Supine   Short Arc Quad Sets 2 sets;10 reps    Short Arc Quad Sets Limitations 3    Bridges 2 sets;10 reps    Straight Leg Raises 2 sets;10 reps    Straight Leg Raises Limitations good quad control      Knee/Hip Exercises: Sidelying   Hip ABduction 2 sets;10 reps      Modalities   Modalities Vasopneumatic      Vasopneumatic   Number Minutes Vasopneumatic  15 minutes    Vasopnuematic Location  Knee    Vasopneumatic Pressure Medium    Vasopneumatic Temperature  34      Manual Therapy   Manual Therapy Joint mobilization;Passive ROM    Joint Mobilization Patellar mobs all directions, tibiofemoral mobs in supine and seated to improve knee flexion and extension    Passive ROM Knee flexion and extension to tolerance, stretching                  PT Education - 02/09/20 1157    Education Details HEP update    Person(s) Educated Patient    Methods Explanation;Demonstration;Tactile cues;Verbal cues;Handout    Comprehension Verbalized understanding;Verbal cues required;Returned demonstration;Tactile cues required;Need further instruction                 PT Long Term Goals - 01/06/20 1338      PT LONG TERM GOAL #1   Title Pt will be I and compliant with HEP.    Time 12    Period Weeks    Status New    Target Date 03/30/20      PT LONG TERM GOAL #2   Title Pt will improve Rt knee ROM 3-110 degrees    Time 12    Period Weeks    Status New      PT LONG TERM GOAL #3   Title Pt will improve Rt knee strength to 5/5 MMT to improve function    Time 12    Period Weeks    Status New      PT LONG TERM GOAL #4   Title Pt will be able to ambulate community distances >500 ft independently, no AD    Time 12    Period Weeks    Status New                 Plan - 02/09/20 1145    Clinical Impression Statement Patient tolerated therapy well with no adverse effects. She continues to report increase in pain level but is consistent with exercises at home. She exhibits improved knee extension with same knee flexion as last visit. Continued with manual and stretching to improve motion and progress strengthening with good tolerance. Patient's HEP progressed this visit to incorporate strengthening exercises. She would benefit from continued skilled PT to progress motion and strength in order to improve walking ability and return to prior level of function.    PT Treatment/Interventions Aquatic Therapy;Cryotherapy;Electrical Stimulation;Iontophoresis 4mg /ml Dexamethasone;Moist Heat;Ultrasound;Gait training;Stair training;Functional mobility training;Therapeutic activities;Therapeutic exercise;Balance training;Neuromuscular re-education;Manual techniques;Passive range of motion;Dry needling;Scar mobilization;Joint Manipulations;Vasopneumatic Device;Taping    PT Next Visit Plan Assess HEP and progress PRN, continue manual and stretching for knee motion, progress strengthening and add to HEP as tolerated, vaso. Consider BFR.    PT Home Exercise Plan [id number]: supine heel slides with strap, supine hip flexor/quad stretch with strap, seated  hamstring stretch, seated knee AROM heel slide, standing lunge knee flexion stretch, standing calf stretch; SLR, bridge, sidelying hip abduction, LAQ, hamstring curl  with green, sit<>stand, standing heel raises    Consulted and Agree with Plan of Care Patient           Patient will benefit from skilled therapeutic intervention in order to improve the following deficits and impairments:  Abnormal gait, Decreased activity tolerance, Decreased balance, Decreased endurance, Decreased mobility, Decreased range of motion, Decreased strength, Difficulty walking, Hypomobility, Increased edema, Increased fascial restricitons, Pain  Visit Diagnosis: Acute pain of right knee  Stiffness of right knee, not elsewhere classified  Muscle weakness (generalized)  Other abnormalities of gait and mobility  Localized edema     Problem List Patient Active Problem List   Diagnosis Date Noted  . Total knee replacement status, right 12/14/2019  . Class 2 obesity due to excess calories without serious comorbidity with body mass index (BMI) of 39.0 to 39.9 in adult 11/05/2019  . Unilateral primary osteoarthritis, right knee 11/05/2019  . Unilateral primary osteoarthritis, left knee 11/05/2019  . Bilateral primary osteoarthritis of knee 01/25/2019    Rosana Hoes, PT, DPT, LAT, ATC 02/09/20  12:01 PM Phone: [phone] Fax: [phone]   [facility] [address] [place], [place], [address] Phone: [phone]   Fax:  [phone]  Name: Julia [name] MRN: [id number] Date of Birth: 1965-10-05

## 2020-02-11 ENCOUNTER — Ambulatory Visit: Payer: Managed Care, Other (non HMO) | Admitting: Physical Therapy

## 2020-02-11 ENCOUNTER — Encounter: Payer: BC Managed Care – PPO | Admitting: Physical Therapy

## 2020-02-16 ENCOUNTER — Ambulatory Visit: Payer: Managed Care, Other (non HMO) | Attending: Family Medicine | Admitting: Physical Therapy

## 2020-02-16 ENCOUNTER — Other Ambulatory Visit: Payer: Self-pay

## 2020-02-16 ENCOUNTER — Encounter: Payer: BC Managed Care – PPO | Admitting: Physical Therapy

## 2020-02-16 ENCOUNTER — Encounter: Payer: Self-pay | Admitting: Physical Therapy

## 2020-02-16 DIAGNOSIS — M25561 Pain in right knee: Secondary | ICD-10-CM

## 2020-02-16 DIAGNOSIS — M25661 Stiffness of right knee, not elsewhere classified: Secondary | ICD-10-CM | POA: Diagnosis present

## 2020-02-16 DIAGNOSIS — M6281 Muscle weakness (generalized): Secondary | ICD-10-CM

## 2020-02-16 DIAGNOSIS — R600 Localized edema: Secondary | ICD-10-CM | POA: Diagnosis present

## 2020-02-16 DIAGNOSIS — R2689 Other abnormalities of gait and mobility: Secondary | ICD-10-CM | POA: Insufficient documentation

## 2020-02-16 NOTE — Therapy (Signed)
[facility] [address] [place], [place], [address] Phone: [phone]   Fax:  [phone]  Physical Therapy Treatment  Patient Details  Name: Julia [name] MRN: [id number] Date of Birth: Oct 08, 1965 Referring Provider (PT): Cleophas Dunker, MD   Encounter Date: 02/16/2020   PT End of Session - 02/16/20 1051    Visit Number 9    Number of Visits 23    Date for PT Re-Evaluation 03/30/20    Authorization Type [employer]    PT Start Time 1045    PT Stop Time 1140    PT Time Calculation (min) 55 min    Activity Tolerance Patient tolerated treatment well    Behavior During Therapy [facility] for tasks assessed/performed           Past Medical History:  Diagnosis Date  . Anxiety   . Arthritis    knees, hands  . Depression     Past Surgical History:  Procedure Laterality Date  . ABDOMINAL HYSTERECTOMY    . KNEE ARTHROSCOPY    . 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   Procedure: RIGHT TOTAL KNEE ARTHROPLASTY;  Surgeon: Valeria Batman, MD;  Location: [facility];  Service: Orthopedics;  Laterality: Right;    There were no vitals filed for this visit.   Subjective Assessment - 02/16/20 1047    Subjective Patient reports her right knee is doing good. She is noticing that out of the blue she can feel the inside of her knee clinching on its own and using her thigh muscle, but this is not painful. She has been walking more and she has been using ankle weight at home. Sleeping is still a struggle but she is sleeping better than she did.    Patient Stated Goals Get back to normal and surf fishing    Currently in Pain? Yes    Pain Score 1     Pain Location Knee    Pain Orientation Right    Pain Descriptors / Indicators Aching;Sore    Pain Type Surgical pain    Pain Onset More than a month ago    Pain Frequency Constant              [facility] PT Assessment - 02/16/20 0001      Assessment   Medical Diagnosis Rt TKA    Referring Provider  (PT) Cleophas Dunker, MD    Onset Date/Surgical Date 12/14/19    Next MD Visit 02/17/2020      AROM   Right Knee Flexion 107      PROM   Right Knee Flexion 109                         [facility] Adult PT Treatment/Exercise - 02/16/20 0001      Neuro Re-ed    Neuro Re-ed Details  SLS 3 x 30 sec each      Exercises   Exercises Knee/Hip      Knee/Hip Exercises: Stretches   Passive Hamstring Stretch 2 reps;30 seconds    Passive Hamstring Stretch Limitations supine PROM and seated with pressure light pressure over knee    Quad Stretch 2 reps;30 seconds    Quad Stretch Limitations prone PROM    Gastroc Stretch 2 reps;30 seconds    Gastroc Stretch Limitations slant board      Knee/Hip Exercises: Aerobic   Nustep L6 x 5 min (LE only)      Knee/Hip Exercises: Machines for Strengthening  Cybex Knee Extension 20# 3x10;     Cybex Leg Press 65# 3x10 with stagegered stance (right lower)      Knee/Hip Exercises: Supine   Straight Leg Raises 2 sets;10 reps      Knee/Hip Exercises: Sidelying   Hip ABduction 2 sets;10 reps      Knee/Hip Exercises: Prone   Hamstring Curl 10 reps      Modalities   Modalities Vasopneumatic      Vasopneumatic   Number Minutes Vasopneumatic  15 minutes    Vasopnuematic Location  Knee    Vasopneumatic Pressure Medium    Vasopneumatic Temperature  34      Manual Therapy   Manual Therapy Joint mobilization;Passive ROM    Joint Mobilization Patellar mobs all directions, tibiofemoral mobs in supine and seated to improve knee flexion and extension    Passive ROM Knee flexion and extension to tolerance, stretching                  PT Education - 02/16/20 1050    Education Details HEP    Person(s) Educated Patient    Methods Explanation;Demonstration;Verbal cues    Comprehension Returned demonstration;Verbalized understanding;Verbal cues required;Need further instruction               PT Long Term Goals - 01/06/20 1338      PT  LONG TERM GOAL #1   Title Pt will be I and compliant with HEP.    Time 12    Period Weeks    Status New    Target Date 03/30/20      PT LONG TERM GOAL #2   Title Pt will improve Rt knee ROM 3-110 degrees    Time 12    Period Weeks    Status New      PT LONG TERM GOAL #3   Title Pt will improve Rt knee strength to 5/5 MMT to improve function    Time 12    Period Weeks    Status New      PT LONG TERM GOAL #4   Title Pt will be able to ambulate community distances >500 ft independently, no AD    Time 12    Period Weeks    Status New                 Plan - 02/16/20 1051    Clinical Impression Statement Patient tolerated therapy well with no adverse effects. She is continuing to progress well with her motion and strengthening, and tolerated single leg balance well. She did demonstrate good balance and this was added to HEP. She would benefit from continued skilled PT to progress motion and strength in order to improve walking ability and return to prior level of function.    PT Treatment/Interventions Aquatic Therapy;Cryotherapy;Electrical Stimulation;Iontophoresis 4mg /ml Dexamethasone;Moist Heat;Ultrasound;Gait training;Stair training;Functional mobility training;Therapeutic activities;Therapeutic exercise;Balance training;Neuromuscular re-education;Manual techniques;Passive range of motion;Dry needling;Scar mobilization;Joint Manipulations;Vasopneumatic Device;Taping    PT Next Visit Plan Assess HEP and progress PRN, continue manual and stretching for knee motion, progress strengthening and add to HEP as tolerated, vaso. Consider BFR.    PT Home Exercise Plan [id number]: supine heel slides with strap, supine hip flexor/quad stretch with strap, seated hamstring stretch, seated knee AROM heel slide, standing lunge knee flexion stretch, standing calf stretch; SLR, bridge, sidelying hip abduction, LAQ, hamstring curl with green, sit<>stand, standing heel raises, SLS    Consulted and  Agree with Plan of Care Patient  Patient will benefit from skilled therapeutic intervention in order to improve the following deficits and impairments:  Abnormal gait, Decreased activity tolerance, Decreased balance, Decreased endurance, Decreased mobility, Decreased range of motion, Decreased strength, Difficulty walking, Hypomobility, Increased edema, Increased fascial restricitons, Pain  Visit Diagnosis: Acute pain of right knee  Stiffness of right knee, not elsewhere classified  Muscle weakness (generalized)  Other abnormalities of gait and mobility  Localized edema     Problem List Patient Active Problem List   Diagnosis Date Noted  . Total knee replacement status, right 12/14/2019  . Class 2 obesity due to excess calories without serious comorbidity with body mass index (BMI) of 39.0 to 39.9 in adult 11/05/2019  . Unilateral primary osteoarthritis, right knee 11/05/2019  . Unilateral primary osteoarthritis, left knee 11/05/2019  . Bilateral primary osteoarthritis of knee 01/25/2019    Rosana Hoes, PT, DPT, LAT, ATC 02/16/20  1:15 PM Phone: [phone] Fax: [phone]   [facility] Out[facility] [address] [place], [place], [address] Phone: [phone]   Fax:  [phone]  Name: Julia [name] MRN: [id number] Date of Birth: 01/21/1966

## 2020-02-17 ENCOUNTER — Encounter: Payer: Self-pay | Admitting: Orthopaedic Surgery

## 2020-02-17 ENCOUNTER — Ambulatory Visit (INDEPENDENT_AMBULATORY_CARE_PROVIDER_SITE_OTHER): Payer: Managed Care, Other (non HMO) | Admitting: Orthopaedic Surgery

## 2020-02-17 VITALS — Ht 68.0 in | Wt 259.0 lb

## 2020-02-17 DIAGNOSIS — Z96651 Presence of right artificial knee joint: Secondary | ICD-10-CM

## 2020-02-17 DIAGNOSIS — M1712 Unilateral primary osteoarthritis, left knee: Secondary | ICD-10-CM

## 2020-02-17 NOTE — Progress Notes (Signed)
Office Visit Note   Patient: Julia [name]           Date of Birth: December 25, 1965           MRN: [id number] Visit Date: 02/17/2020              Requested by: Lavada Mesi, MD [address] [place],  [place] [address] PCP: Lavada Mesi, MD   Assessment & Plan: Visit Diagnoses:  1. Unilateral primary osteoarthritis, left knee   2. Total knee replacement status, right     Plan:  #1: We have discussed the continued exercise program that she needs to keep her knee strong.  Her plan is to continue her exercises as taught by PT as well as her walking.  At this time she is doing almost 1/3 of a mile without difficulty. #2: I have explained the use of Voltaren gel for her left knee which is not been replaced.    Follow-Up Instructions: Return in about 3 months (around 05/19/2020).  Onset  Orders:  No orders of the defined types were placed in this encounter.  No orders of the defined types were placed in this encounter.     Procedures: No procedures performed   Clinical Data: No additional findings.   Subjective: Chief Complaint  Patient presents with  . Right Knee - Follow-up    Right TKA 12/14/2019   HPI Patient presents today for follow up on her right knee. She had a right total knee arthroplasty on 12/14/2019. She is now two months out from surgery. Patient states that she is doing well. She is going to physical therapy twice weekly. She is taking Ibuprofen for pain, but takes hydrocodone before therapy. She is still having trouble sleeping at night due to pain.    Review of Systems   Objective: Vital Signs: Ht 5\' 8"  (1.727 m)   Wt 259 lb (117.5 kg)   BMI 39.38 kg/m   Physical Exam  Ortho Exam  Exam today reveals the wound to be very well-healed.  She has range of motion from full extension to at least 100 to 105 degrees comfortably here in the office.  Mild effusion consistent with her surgery.  Calf is supple nontender.  Neurovascular intact  distally.  Specialty Comments:  No specialty comments available.  Imaging: No results found.   PMFS History: Current Outpatient Medications  Medication Sig Dispense Refill  . acetaminophen (TYLENOL) 325 MG tablet Take 2 tablets (650 mg total) by mouth every 6 (six) hours as needed. 100 tablet 2  . aspirin EC 81 MG tablet Take 1 tablet (81 mg total) by mouth 2 (two) times daily. 150 tablet 2  . clonazePAM (KLONOPIN) 1 MG tablet Take 1 mg by mouth 2 (two) times daily as needed for anxiety.     . gabapentin (NEURONTIN) 100 MG capsule Take 1 capsule (100 mg total) by mouth 3 (three) times daily. 40 capsule 0  . HYDROcodone-acetaminophen (NORCO/VICODIN) 5-325 MG tablet Take 1 tablet by mouth every 8 (eight) hours as needed for moderate pain. 30 tablet 0  . lisinopril-hydrochlorothiazide (ZESTORETIC) 10-12.5 MG tablet Start 1/2 tab PO qd, may increase to 1 PO qd if needed for BP control. 30 tablet 6  . Multiple Vitamins-Minerals (MULTIVITAMIN WITH MINERALS) tablet Take 1 tablet by mouth daily. Chewable    . Vilazodone HCl (VIIBRYD) 20 MG TABS Take 20 mg by mouth daily.     . vitamin B-12 (CYANOCOBALAMIN) 1000 MCG tablet Take 1,000 mcg by mouth  daily. Chewable    . zolpidem (AMBIEN) 10 MG tablet Take 10 mg by mouth at bedtime as needed for sleep.     Marland Kitchen HYDROcodone-acetaminophen (NORCO/VICODIN) 5-325 MG tablet Take 1 tablet by mouth every 6 (six) hours as needed for moderate pain. (Patient not taking: Reported on 02/17/2020) 30 tablet 0  . HYDROcodone-acetaminophen (NORCO/VICODIN) 5-325 MG tablet Take 1 tablet by mouth every 6 (six) hours as needed for moderate pain. (Patient not taking: Reported on 02/17/2020) 30 tablet 0  . oxycodone (OXY-IR) 5 MG capsule Take 1-2 capsules (5-10 mg total) by mouth every 4 (four) hours as needed for pain (STOP DILAUDID). (Patient not taking: Reported on 02/17/2020) 30 capsule 0  . oxyCODONE (OXYCONTIN) 10 mg 12 hr tablet Take 1 tablet (10 mg total) by mouth every 12  (twelve) hours. (Patient not taking: Reported on 02/17/2020) 10 tablet 0  . oxyCODONE (ROXICODONE) 5 MG immediate release tablet Take 1 tablet (5 mg total) by mouth every 8 (eight) hours as needed. (Patient not taking: Reported on 02/17/2020) 20 tablet 0   No current facility-administered medications for this visit.    Patient Active Problem List   Diagnosis Date Noted  . Total knee replacement status, right 12/14/2019  . Class 2 obesity due to excess calories without serious comorbidity with body mass index (BMI) of 39.0 to 39.9 in adult 11/05/2019  . Unilateral primary osteoarthritis, right knee 11/05/2019  . Unilateral primary osteoarthritis, left knee 11/05/2019  . Bilateral primary osteoarthritis of knee 01/25/2019   Past Medical History:  Diagnosis Date  . Anxiety   . Arthritis    knees, hands  . Depression     Family History  Problem Relation Age of Onset  . Cancer Mother     Past Surgical History:  Procedure Laterality Date  . ABDOMINAL HYSTERECTOMY    . KNEE ARTHROSCOPY    . 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   Procedure: RIGHT TOTAL KNEE ARTHROPLASTY;  Surgeon: Valeria Batman, MD;  Location: [facility];  Service: Orthopedics;  Laterality: Right;   Social History   Occupational History  . Not on file  Tobacco Use  . Smoking status: Former Smoker    Packs/day: 1.00    Years: 15.00    Pack years: 15.00    Types: Cigarettes    Quit date: 12/02/2012    Years since quitting: 7.2  . Smokeless tobacco: Never Used  Vaping Use  . Vaping Use: Never used  Substance and Sexual Activity  . Alcohol use: Not Currently  . Drug use: Never  . Sexual activity: Yes

## 2020-02-18 ENCOUNTER — Encounter: Payer: Self-pay | Admitting: Physical Therapy

## 2020-02-18 ENCOUNTER — Other Ambulatory Visit: Payer: Self-pay

## 2020-02-18 ENCOUNTER — Ambulatory Visit: Payer: Managed Care, Other (non HMO) | Admitting: Physical Therapy

## 2020-02-18 ENCOUNTER — Encounter: Payer: BC Managed Care – PPO | Admitting: Physical Therapy

## 2020-02-18 DIAGNOSIS — M25561 Pain in right knee: Secondary | ICD-10-CM | POA: Diagnosis not present

## 2020-02-18 DIAGNOSIS — R2689 Other abnormalities of gait and mobility: Secondary | ICD-10-CM

## 2020-02-18 DIAGNOSIS — R600 Localized edema: Secondary | ICD-10-CM

## 2020-02-18 DIAGNOSIS — M25661 Stiffness of right knee, not elsewhere classified: Secondary | ICD-10-CM

## 2020-02-18 DIAGNOSIS — M6281 Muscle weakness (generalized): Secondary | ICD-10-CM

## 2020-02-18 NOTE — Therapy (Signed)
[facility] Out[facility] Rehabilitation [facility] [address] [place], [place], [address] Phone: [phone]   Fax:  [phone]  Physical Therapy Treatment  Patient Details  Name: Julia [name] MRN: [id number] Date of Birth: May 02, 1966 Referring Provider (PT): Cleophas Dunker, MD   Encounter Date: 02/18/2020   PT End of Session - 02/18/20 1050    Visit Number 10    Number of Visits 23    Date for PT Re-Evaluation 03/30/20    Authorization Type [employer]    PT Start Time 1045    PT Stop Time 1130    PT Time Calculation (min) 45 min    Activity Tolerance Patient tolerated treatment well    Behavior During Therapy [facility] for tasks assessed/performed           Past Medical History:  Diagnosis Date  . Anxiety   . Arthritis    knees, hands  . Depression     Past Surgical History:  Procedure Laterality Date  . ABDOMINAL HYSTERECTOMY    . KNEE ARTHROSCOPY    . 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   Procedure: RIGHT TOTAL KNEE ARTHROPLASTY;  Surgeon: Valeria Batman, MD;  Location: [facility];  Service: Orthopedics;  Laterality: Right;    There were no vitals filed for this visit.   Subjective Assessment - 02/18/20 1047    Subjective Patient reports her knee is a little sore today, one of her dogs ran into her knee since last visits.Otherwise she is doing well.    Patient Stated Goals Get back to normal and surf fishing    Currently in Pain? Yes    Pain Score 3     Pain Location Knee    Pain Orientation Right    Pain Descriptors / Indicators Sore    Pain Type Surgical pain    Pain Onset More than a month ago    Pain Frequency Constant              [facility] PT Assessment - 02/18/20 0001      AROM   Right Knee Flexion 107      PROM   Right Knee Flexion 109                         [facility] Adult PT Treatment/Exercise - 02/18/20 0001      Neuro Re-ed    Neuro Re-ed Details  SLS 3 x 30 sec each on Airex      Exercises   Exercises Knee/Hip       Knee/Hip Exercises: Stretches   Passive Hamstring Stretch 2 reps;30 seconds    Passive Hamstring Stretch Limitations supine PROM    Hip Flexor Stretch 2 reps;30 seconds    Hip Flexor Stretch Limitations supine edge of mat PROM    Gastroc Stretch 2 reps;30 seconds    Gastroc Stretch Limitations slant board      Knee/Hip Exercises: Aerobic   Nustep L5 x 5 min (LE only)      Knee/Hip Exercises: Machines for Strengthening   Cybex Knee Extension 20# 2x10    Cybex Knee Flexion 25# 2x10    Cybex Leg Press 65# 3x10 with stagegered stance (right lower)                  PT Education - 02/18/20 1050    Education Details HEP    Person(s) Educated Patient    Methods Explanation;Demonstration;Verbal cues    Comprehension Verbalized understanding;Returned demonstration;Verbal cues required;Need further  instruction               PT Long Term Goals - 01/06/20 1338      PT LONG TERM GOAL #1   Title Pt will be I and compliant with HEP.    Time 12    Period Weeks    Status New    Target Date 03/30/20      PT LONG TERM GOAL #2   Title Pt will improve Rt knee ROM 3-110 degrees    Time 12    Period Weeks    Status New      PT LONG TERM GOAL #3   Title Pt will improve Rt knee strength to 5/5 MMT to improve function    Time 12    Period Weeks    Status New      PT LONG TERM GOAL #4   Title Pt will be able to ambulate community distances >500 ft independently, no AD    Time 12    Period Weeks    Status New                 Plan - 02/18/20 1052    Clinical Impression Statement Patient tolerated therapy well with no adverse effects. She is progressing well with strength and balance without any increase in pain. She does continue to exhibit limitation with knee motion but overall is doing really well. She would benefit from continued skilled PT to progress motion and strength in order to improve walking ability and return to prior level of function.    PT  Treatment/Interventions Aquatic Therapy;Cryotherapy;Electrical Stimulation;Iontophoresis 4mg /ml Dexamethasone;Moist Heat;Ultrasound;Gait training;Stair training;Functional mobility training;Therapeutic activities;Therapeutic exercise;Balance training;Neuromuscular re-education;Manual techniques;Passive range of motion;Dry needling;Scar mobilization;Joint Manipulations;Vasopneumatic Device;Taping    PT Next Visit Plan Assess HEP and progress PRN, continue manual and stretching for knee motion, progress strengthening and add to HEP as tolerated, vaso. Consider BFR.    PT Home Exercise Plan [id number]: supine heel slides with strap, supine hip flexor/quad stretch with strap, seated hamstring stretch, seated knee AROM heel slide, standing lunge knee flexion stretch, standing calf stretch; SLR, bridge, sidelying hip abduction, LAQ, hamstring curl with green, sit<>stand, standing heel raises, SLS with EC    Consulted and Agree with Plan of Care Patient           Patient will benefit from skilled therapeutic intervention in order to improve the following deficits and impairments:  Abnormal gait, Decreased activity tolerance, Decreased balance, Decreased endurance, Decreased mobility, Decreased range of motion, Decreased strength, Difficulty walking, Hypomobility, Increased edema, Increased fascial restricitons, Pain  Visit Diagnosis: Acute pain of right knee  Stiffness of right knee, not elsewhere classified  Muscle weakness (generalized)  Other abnormalities of gait and mobility  Localized edema     Problem List Patient Active Problem List   Diagnosis Date Noted  . Total knee replacement status, right 12/14/2019  . Class 2 obesity due to excess calories without serious comorbidity with body mass index (BMI) of 39.0 to 39.9 in adult 11/05/2019  . Unilateral primary osteoarthritis, right knee 11/05/2019  . Unilateral primary osteoarthritis, left knee 11/05/2019  . Bilateral primary  osteoarthritis of knee 01/25/2019    01/27/2019, PT, DPT, LAT, ATC 02/18/20  12:09 PM Phone: [phone] Fax: [phone]   [facility] Outpatient Rehabilitation [facility] [address] [place], [place], [place] Phone: [phone]   Fax:  [phone]  Name: Julia [name] MRN: Criss Alvine Date of Birth: [birth year]

## 2020-02-22 ENCOUNTER — Encounter: Payer: Self-pay | Admitting: Physical Therapy

## 2020-02-22 ENCOUNTER — Other Ambulatory Visit: Payer: Self-pay

## 2020-02-22 ENCOUNTER — Ambulatory Visit: Payer: Managed Care, Other (non HMO) | Admitting: Physical Therapy

## 2020-02-22 DIAGNOSIS — M25661 Stiffness of right knee, not elsewhere classified: Secondary | ICD-10-CM

## 2020-02-22 DIAGNOSIS — M6281 Muscle weakness (generalized): Secondary | ICD-10-CM

## 2020-02-22 DIAGNOSIS — R2689 Other abnormalities of gait and mobility: Secondary | ICD-10-CM

## 2020-02-22 DIAGNOSIS — M25561 Pain in right knee: Secondary | ICD-10-CM

## 2020-02-22 DIAGNOSIS — R600 Localized edema: Secondary | ICD-10-CM

## 2020-02-22 NOTE — Therapy (Signed)
[facility] Outpatient Rehabilitation [facility] [address] [place], [place], [address] Phone: [phone]   Fax:  [phone]  Physical Therapy Treatment  Patient Details  Name: Julia [name] MRN: [id number] Date of Birth: 27-Dec-1965 Referring Provider (PT): Cleophas Dunker, MD   Encounter Date: 02/22/2020   PT End of Session - 02/22/20 1136    Visit Number 11    Number of Visits 23    Date for PT Re-Evaluation 03/30/20    Authorization Type [employer]    PT Start Time 1130    PT Stop Time 1225    PT Time Calculation (min) 55 min    Activity Tolerance Patient tolerated treatment well    Behavior During Therapy [facility] for tasks assessed/performed           Past Medical History:  Diagnosis Date  . Anxiety   . Arthritis    knees, hands  . Depression     Past Surgical History:  Procedure Laterality Date  . ABDOMINAL HYSTERECTOMY    . KNEE ARTHROSCOPY    . 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   Procedure: RIGHT TOTAL KNEE ARTHROPLASTY;  Surgeon: Valeria Batman, MD;  Location: [facility];  Service: Orthopedics;  Laterality: Right;    There were no vitals filed for this visit.   Subjective Assessment - 02/22/20 1132    Subjective Patient reports her knee is bothering her today because she had to drive a long period yesterday and feels going from gas/brake irritated the knee.    Patient Stated Goals Get back to normal and surf fishing    Currently in Pain? Yes    Pain Score 5     Pain Location Knee    Pain Orientation Right    Pain Descriptors / Indicators Sore;Aching    Pain Type Surgical pain    Pain Onset More than a month ago    Pain Frequency Constant                             [facility] Adult PT Treatment/Exercise - 02/22/20 0001      Exercises   Exercises Knee/Hip      Knee/Hip Exercises: Stretches   Passive Hamstring Stretch 2 reps;30 seconds    Passive Hamstring Stretch Limitations supine PROM    Hip Flexor Stretch 2 reps;30  seconds    Hip Flexor Stretch Limitations supine edge of mat PROM    Gastroc Stretch 2 reps;30 seconds    Gastroc Stretch Limitations slant board      Knee/Hip Exercises: Aerobic   Nustep L5 x 5 min (LE only)      Modalities   Modalities Vasopneumatic      Vasopneumatic   Number Minutes Vasopneumatic  15 minutes    Vasopnuematic Location  Knee    Vasopneumatic Pressure Medium    Vasopneumatic Temperature  34      Manual Therapy   Manual Therapy Joint mobilization;Soft tissue mobilization;Passive ROM    Joint Mobilization Patellar mobs all directions, tibiofemoral mobs in supine and seated to improve knee flexion and extension    Soft tissue mobilization Quad, hamstring, calf with roller    Passive ROM Knee flexion and extension to tolerance, stretching                  PT Education - 02/22/20 1134    Education Details HEP, RICE    Person(s) Educated Patient    Methods Explanation;Demonstration;Verbal cues  Comprehension Verbalized understanding;Returned demonstration;Verbal cues required;Need further instruction               PT Long Term Goals - 01/06/20 1338      PT LONG TERM GOAL #1   Title Pt will be I and compliant with HEP.    Time 12    Period Weeks    Status New    Target Date 03/30/20      PT LONG TERM GOAL #2   Title Pt will improve Rt knee ROM 3-110 degrees    Time 12    Period Weeks    Status New      PT LONG TERM GOAL #3   Title Pt will improve Rt knee strength to 5/5 MMT to improve function    Time 12    Period Weeks    Status New      PT LONG TERM GOAL #4   Title Pt will be able to ambulate community distances >500 ft independently, no AD    Time 12    Period Weeks    Status New                 Plan - 02/22/20 1138    Clinical Impression Statement Patient tolerated therapy well with no adverse effects. Patient reports increase is pain elvel this visit so focused more on manual and stretching to reduce pain and used  vaso following as patient did exhibit slight increase in swelling of the medial aspect. Patient did report improvement in symptoms following therapy and she was encouraged to perform actvity as tolerated. She would benefit from continued skilled PT to progress motion and strength in order to improve walking ability and return to prior level of function.    PT Treatment/Interventions Aquatic Therapy;Cryotherapy;Electrical Stimulation;Iontophoresis 4mg /ml Dexamethasone;Moist Heat;Ultrasound;Gait training;Stair training;Functional mobility training;Therapeutic activities;Therapeutic exercise;Balance training;Neuromuscular re-education;Manual techniques;Passive range of motion;Dry needling;Scar mobilization;Joint Manipulations;Vasopneumatic Device;Taping    PT Next Visit Plan Assess HEP and progress PRN, continue manual and stretching for knee motion, progress strengthening and add to HEP as tolerated, vaso. Consider BFR.    PT Home Exercise Plan [id number]: supine heel slides with strap, supine hip flexor/quad stretch with strap, seated hamstring stretch, seated knee AROM heel slide, standing lunge knee flexion stretch, standing calf stretch; SLR, bridge, sidelying hip abduction, LAQ, hamstring curl with green, sit<>stand, standing heel raises, SLS with EC    Consulted and Agree with Plan of Care Patient           Patient will benefit from skilled therapeutic intervention in order to improve the following deficits and impairments:  Abnormal gait, Decreased activity tolerance, Decreased balance, Decreased endurance, Decreased mobility, Decreased range of motion, Decreased strength, Difficulty walking, Hypomobility, Increased edema, Increased fascial restricitons, Pain  Visit Diagnosis: Acute pain of right knee  Stiffness of right knee, not elsewhere classified  Muscle weakness (generalized)  Other abnormalities of gait and mobility  Localized edema     Problem List Patient Active Problem List    Diagnosis Date Noted  . Total knee replacement status, right 12/14/2019  . Class 2 obesity due to excess calories without serious comorbidity with body mass index (BMI) of 39.0 to 39.9 in adult 11/05/2019  . Unilateral primary osteoarthritis, right knee 11/05/2019  . Unilateral primary osteoarthritis, left knee 11/05/2019  . Bilateral primary osteoarthritis of knee 01/25/2019    01/27/2019, PT, DPT, LAT, ATC 02/22/20  12:17 PM Phone: [phone] Fax: [phone]   [facility] [phone]  [address] [place], [place], [address] Phone: [phone]   Fax:  [phone]  Name: LEONE MOBLEY MRN: [id number] Date of Birth: 03-Oct-1965

## 2020-02-23 ENCOUNTER — Ambulatory Visit: Payer: Managed Care, Other (non HMO) | Admitting: Physical Therapy

## 2020-02-23 ENCOUNTER — Encounter: Payer: Self-pay | Admitting: Physical Therapy

## 2020-02-23 ENCOUNTER — Other Ambulatory Visit: Payer: Self-pay

## 2020-02-23 DIAGNOSIS — M25561 Pain in right knee: Secondary | ICD-10-CM

## 2020-02-23 DIAGNOSIS — R600 Localized edema: Secondary | ICD-10-CM

## 2020-02-23 DIAGNOSIS — M6281 Muscle weakness (generalized): Secondary | ICD-10-CM

## 2020-02-23 DIAGNOSIS — R2689 Other abnormalities of gait and mobility: Secondary | ICD-10-CM

## 2020-02-23 DIAGNOSIS — M25661 Stiffness of right knee, not elsewhere classified: Secondary | ICD-10-CM

## 2020-02-23 NOTE — Therapy (Signed)
[facility] Outpatient Rehabilitation [facility] [address] [place], [place], [address] Phone: [phone]   Fax:  [phone]  Physical Therapy Treatment  Patient Details  Name: Julia [name] MRN: [id number] Date of Birth: 20-Mar-1966 Referring Provider (PT): Cleophas Dunker, MD   Encounter Date: 02/23/2020   PT End of Session - 02/23/20 1009    Visit Number 12    Number of Visits 23    Date for PT Re-Evaluation 03/30/20    Authorization Type [employer]    PT Start Time 1000    PT Stop Time 1055    PT Time Calculation (min) 55 min    Activity Tolerance Patient tolerated treatment well    Behavior During Therapy [facility] for tasks assessed/performed           Past Medical History:  Diagnosis Date  . Anxiety   . Arthritis    knees, hands  . Depression     Past Surgical History:  Procedure Laterality Date  . ABDOMINAL HYSTERECTOMY    . KNEE ARTHROSCOPY    . 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   Procedure: RIGHT TOTAL KNEE ARTHROPLASTY;  Surgeon: Valeria Batman, MD;  Location: [facility];  Service: Orthopedics;  Laterality: Right;    There were no vitals filed for this visit.   Subjective Assessment - 02/23/20 1006    Subjective Patient reports she is feeling better today.    Patient Stated Goals Get back to normal and surf fishing    Currently in Pain? Yes    Pain Score 3     Pain Location Knee    Pain Orientation Right    Pain Descriptors / Indicators Aching;Sore    Pain Type Surgical pain    Pain Onset More than a month ago    Pain Frequency Constant              [facility] PT Assessment - 02/23/20 0001      AROM   Right Knee Flexion 110                         [facility] Adult PT Treatment/Exercise - 02/23/20 0001      Exercises   Exercises Knee/Hip      Knee/Hip Exercises: Stretches   Passive Hamstring Stretch 2 reps;30 seconds    Passive Hamstring Stretch Limitations supine PROM    Quad Stretch 2 reps;30 seconds    Quad Stretch  Limitations prone PROM    Hip Flexor Stretch 2 reps;30 seconds    Hip Flexor Stretch Limitations supine edge of mat PROM    Gastroc Stretch 2 reps;30 seconds    Gastroc Stretch Limitations slant board      Knee/Hip Exercises: Aerobic   Nustep L5 x 5 min (LE only)      Knee/Hip Exercises: Machines for Strengthening   Cybex Knee Extension 20# 2x10    Cybex Leg Press 75# 3x10 with stagegered stance (right lower)      Knee/Hip Exercises: Standing   Heel Raises 2 sets;15 reps    Hip Flexion 2 sets;10 reps    Hip Flexion Limitations marching    Hip Abduction 2 sets;15 reps    Lateral Step Up 10 reps    Lateral Step Up Limitations 6" step-height    Forward Step Up 10 reps    Forward Step Up Limitations 6" step-height      Modalities   Modalities Vasopneumatic      Vasopneumatic   Number Minutes Vasopneumatic  10 minutes    Vasopnuematic Location  Knee    Vasopneumatic Pressure Medium    Vasopneumatic Temperature  34      Manual Therapy   Manual Therapy Joint mobilization;Passive ROM    Joint Mobilization Patellar mobs all directions, tibiofemoral mobs in supine and seated to improve knee flexion and extension    Passive ROM Knee flexion and extension to tolerance, stretching                  PT Education - 02/23/20 1008    Education Details HEP    Person(s) Educated Patient    Methods Explanation;Demonstration;Verbal cues    Comprehension Verbalized understanding;Returned demonstration;Verbal cues required;Need further instruction               PT Long Term Goals - 01/06/20 1338      PT LONG TERM GOAL #1   Title Pt will be I and compliant with HEP.    Time 12    Period Weeks    Status New    Target Date 03/30/20      PT LONG TERM GOAL #2   Title Pt will improve Rt knee ROM 3-110 degrees    Time 12    Period Weeks    Status New      PT LONG TERM GOAL #3   Title Pt will improve Rt knee strength to 5/5 MMT to improve function    Time 12    Period  Weeks    Status New      PT LONG TERM GOAL #4   Title Pt will be able to ambulate community distances >500 ft independently, no AD    Time 12    Period Weeks    Status New                 Plan - 02/23/20 1009    Clinical Impression Statement Patient tolerated therapy well with no adverse effects. Patient doing much better this visit and exhibits improvement in knee flexion motion. She tolerated exercises well without any report of increased pain. Continued with vaso to improve swelling that seems to be located around pes anserine area. She would benefit from continued skilled PT to progress motion and strength in order to improve walking ability and return to prior level of function.    PT Treatment/Interventions Aquatic Therapy;Cryotherapy;Electrical Stimulation;Iontophoresis 4mg /ml Dexamethasone;Moist Heat;Ultrasound;Gait training;Stair training;Functional mobility training;Therapeutic activities;Therapeutic exercise;Balance training;Neuromuscular re-education;Manual techniques;Passive range of motion;Dry needling;Scar mobilization;Joint Manipulations;Vasopneumatic Device;Taping    PT Next Visit Plan Assess HEP and progress PRN, continue manual and stretching for knee motion, progress strengthening and add to HEP as tolerated, vaso. Consider BFR.    PT Home Exercise Plan [id number]: supine heel slides with strap, supine hip flexor/quad stretch with strap, seated hamstring stretch, seated knee AROM heel slide, standing lunge knee flexion stretch, standing calf stretch; SLR, bridge, sidelying hip abduction, LAQ, hamstring curl with green, sit<>stand, standing heel raises, SLS with EC    Consulted and Agree with Plan of Care Patient           Patient will benefit from skilled therapeutic intervention in order to improve the following deficits and impairments:  Abnormal gait, Decreased activity tolerance, Decreased balance, Decreased endurance, Decreased mobility, Decreased range of motion,  Decreased strength, Difficulty walking, Hypomobility, Increased edema, Increased fascial restricitons, Pain  Visit Diagnosis: Acute pain of right knee  Stiffness of right knee, not elsewhere classified  Muscle weakness (generalized)  Other abnormalities of gait and mobility  Localized edema     Problem List Patient Active Problem List   Diagnosis Date Noted  . Total knee replacement status, right 12/14/2019  . Class 2 obesity due to excess calories without serious comorbidity with body mass index (BMI) of 39.0 to 39.9 in adult 11/05/2019  . Unilateral primary osteoarthritis, right knee 11/05/2019  . Unilateral primary osteoarthritis, left knee 11/05/2019  . Bilateral primary osteoarthritis of knee 01/25/2019    Rosana Hoes, PT, DPT, LAT, ATC 02/23/20  10:51 AM Phone: [phone] Fax: [phone]   [facility] Outpatient Rehabilitation [facility] [address] [place], [place], [address] Phone: [phone]   Fax:  [phone]  Name: Julia [name] MRN: [id number] Date of Birth: 09-Nov-1965

## 2020-02-25 ENCOUNTER — Other Ambulatory Visit: Payer: Self-pay | Admitting: Orthopaedic Surgery

## 2020-02-25 MED ORDER — HYDROCODONE-ACETAMINOPHEN 5-325 MG PO TABS: 1.0000 | ORAL_TABLET | Freq: Four times a day (QID) | ORAL | 0 refills | Status: DC | PRN

## 2020-02-25 NOTE — Telephone Encounter (Signed)
Please advise 

## 2020-02-25 NOTE — Telephone Encounter (Signed)
Can you hit "ApproveRx" so it will come out of my box? It won't let me do it

## 2020-02-25 NOTE — Telephone Encounter (Signed)
sent 

## 2020-02-28 NOTE — Telephone Encounter (Signed)
Sent in prescription via imprivata Fri-cannot find approve Rx-but you cannot send in opiod meds

## 2020-03-01 ENCOUNTER — Encounter: Payer: Self-pay | Admitting: Orthopedic Surgery

## 2020-03-01 ENCOUNTER — Ambulatory Visit: Payer: Self-pay

## 2020-03-01 ENCOUNTER — Other Ambulatory Visit: Payer: Self-pay

## 2020-03-01 ENCOUNTER — Ambulatory Visit (INDEPENDENT_AMBULATORY_CARE_PROVIDER_SITE_OTHER): Payer: Managed Care, Other (non HMO) | Admitting: Orthopedic Surgery

## 2020-03-01 ENCOUNTER — Ambulatory Visit: Payer: Managed Care, Other (non HMO) | Admitting: Physical Therapy

## 2020-03-01 VITALS — Ht 68.0 in | Wt 259.0 lb

## 2020-03-01 DIAGNOSIS — Z96651 Presence of right artificial knee joint: Secondary | ICD-10-CM

## 2020-03-01 NOTE — Progress Notes (Signed)
Office Visit Note   Patient: Julia [name]           Date of Birth: 10-Apr-1966           MRN: [id number] Visit Date: 03/01/2020              Requested by: Lavada Mesi, MD [address] [place],  [place] [address] PCP: Lavada Mesi, MD   Assessment & Plan: Visit Diagnoses:  1. Total knee replacement status, right     Plan:  #1: At this time I have evaluated Annie's knee and feel that there is nothing occult at this time.  I also had Dr. Dorene Grebe evaluated her also and felt that there was no signs of an infection or abnormality with the prosthesis this time. #2: At this time will stay with conservative treatment.  Limit her exercises at this time and follow back up in 1 week for recheck evaluation or earlier if needed  Follow-Up Instructions: Return in about 8 days (around 03/09/2020).   Orders:  Orders Placed This Encounter  Procedures  . XR KNEE 3 VIEW RIGHT   No orders of the defined types were placed in this encounter.     Procedures: No procedures performed   Clinical Data: No additional findings.   Subjective: Chief Complaint  Patient presents with  . Right Knee - Follow-up    Right total knee arthroplasty 12/14/2019   HPI Patient presents today for her right knee. She had a right total knee arthroplasty on 12/14/2019. She is now 11 weeks out from surgery. She states that she was doing great last week and walking normally. Two days she got up and had pain, with no injury. She states that it has been painful medially and laterally, along with swelling. She has been using her cane with ambulation. She said that her leg feels heavy and "weird". She goes to physical therapy and states that generally only uses hydrocodone for that, but has been having to use it the last couple days even without therapy.    Review of Systems  Constitutional: Negative.   HENT: Negative.   Respiratory: Negative.   Cardiovascular: Negative.   Gastrointestinal: Negative.    Genitourinary: Negative.   Skin: Negative.   Neurological: Negative.   Hematological: Negative.   Psychiatric/Behavioral: Negative.      Objective: Vital Signs: Ht 5\' 8"  (1.727 m)   Wt 259 lb (117.5 kg)   BMI 39.38 kg/m   Physical Exam  Ortho Exam  Exam today reveals a few degrees lacking full extension.  She flexes to about 90 to 95degrees.  She has diffuse tenderness about the knee.  No real erythema or warmth.  Ligamentously stable with varus and valgus stressing.  Negative posterior drawer.  Mild positive anterior drawer.  No patellofemoral crepitance noted.  Specialty Comments:  No specialty comments available.  Imaging: No results found.   PMFS History: Current Outpatient Medications  Medication Sig Dispense Refill  . acetaminophen (TYLENOL) 325 MG tablet Take 2 tablets (650 mg total) by mouth every 6 (six) hours as needed. 100 tablet 2  . aspirin EC 81 MG tablet Take 1 tablet (81 mg total) by mouth 2 (two) times daily. 150 tablet 2  . clonazePAM (KLONOPIN) 1 MG tablet Take 1 mg by mouth 2 (two) times daily as needed for anxiety.     . gabapentin (NEURONTIN) 100 MG capsule Take 1 capsule (100 mg total) by mouth 3 (three) times daily. 40 capsule 0  .  HYDROcodone-acetaminophen (NORCO/VICODIN) 5-325 MG tablet Take 1 tablet by mouth every 6 (six) hours as needed for moderate pain. 30 tablet 0  . HYDROcodone-acetaminophen (NORCO/VICODIN) 5-325 MG tablet Take 1 tablet by mouth every 6 (six) hours as needed for moderate pain. 30 tablet 0  . HYDROcodone-acetaminophen (NORCO/VICODIN) 5-325 MG tablet Take 1 tablet by mouth every 8 (eight) hours as needed for moderate pain. 30 tablet 0  . HYDROcodone-acetaminophen (NORCO/VICODIN) 5-325 MG tablet Take 1 tablet by mouth every 6 (six) hours as needed for moderate pain. 30 tablet 0  . lisinopril-hydrochlorothiazide (ZESTORETIC) 10-12.5 MG tablet Start 1/2 tab PO qd, may increase to 1 PO qd if needed for BP control. 30 tablet 6  .  Multiple Vitamins-Minerals (MULTIVITAMIN WITH MINERALS) tablet Take 1 tablet by mouth daily. Chewable    . oxycodone (OXY-IR) 5 MG capsule Take 1-2 capsules (5-10 mg total) by mouth every 4 (four) hours as needed for pain (STOP DILAUDID). 30 capsule 0  . oxyCODONE (OXYCONTIN) 10 mg 12 hr tablet Take 1 tablet (10 mg total) by mouth every 12 (twelve) hours. 10 tablet 0  . oxyCODONE (ROXICODONE) 5 MG immediate release tablet Take 1 tablet (5 mg total) by mouth every 8 (eight) hours as needed. 20 tablet 0  . Vilazodone HCl (VIIBRYD) 20 MG TABS Take 20 mg by mouth daily.     . vitamin B-12 (CYANOCOBALAMIN) 1000 MCG tablet Take 1,000 mcg by mouth daily. Chewable    . zolpidem (AMBIEN) 10 MG tablet Take 10 mg by mouth at bedtime as needed for sleep.      No current facility-administered medications for this visit.    Patient Active Problem List   Diagnosis Date Noted  . Total knee replacement status, right 12/14/2019  . Class 2 obesity due to excess calories without serious comorbidity with body mass index (BMI) of 39.0 to 39.9 in adult 11/05/2019  . Unilateral primary osteoarthritis, right knee 11/05/2019  . Unilateral primary osteoarthritis, left knee 11/05/2019  . Bilateral primary osteoarthritis of knee 01/25/2019   Past Medical History:  Diagnosis Date  . Anxiety   . Arthritis    knees, hands  . Depression     Family History  Problem Relation Age of Onset  . Cancer Mother     Past Surgical History:  Procedure Laterality Date  . ABDOMINAL HYSTERECTOMY    . KNEE ARTHROSCOPY    . 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   Procedure: RIGHT TOTAL KNEE ARTHROPLASTY;  Surgeon: Valeria Batman, MD;  Location: [facility];  Service: Orthopedics;  Laterality: Right;   Social History   Occupational History  . Not on file  Tobacco Use  . Smoking status: Former Smoker    Packs/day: 1.00    Years: 15.00    Pack years: 15.00    Types: Cigarettes    Quit date: 12/02/2012    Years since  quitting: 7.2  . Smokeless tobacco: Never Used  Vaping Use  . Vaping Use: Never used  Substance and Sexual Activity  . Alcohol use: Not Currently  . Drug use: Never  . Sexual activity: Yes

## 2020-03-02 ENCOUNTER — Ambulatory Visit: Payer: Managed Care, Other (non HMO) | Admitting: Physical Therapy

## 2020-03-03 ENCOUNTER — Ambulatory Visit: Payer: Managed Care, Other (non HMO) | Admitting: Physical Therapy

## 2020-03-07 ENCOUNTER — Encounter: Payer: Self-pay | Admitting: Physical Therapy

## 2020-03-09 ENCOUNTER — Ambulatory Visit (INDEPENDENT_AMBULATORY_CARE_PROVIDER_SITE_OTHER): Payer: Managed Care, Other (non HMO) | Admitting: Orthopaedic Surgery

## 2020-03-09 ENCOUNTER — Encounter: Payer: Self-pay | Admitting: Orthopaedic Surgery

## 2020-03-09 ENCOUNTER — Other Ambulatory Visit: Payer: Self-pay

## 2020-03-09 VITALS — Ht 68.0 in | Wt 259.0 lb

## 2020-03-09 DIAGNOSIS — Z96651 Presence of right artificial knee joint: Secondary | ICD-10-CM

## 2020-03-09 DIAGNOSIS — M170 Bilateral primary osteoarthritis of knee: Secondary | ICD-10-CM

## 2020-03-09 NOTE — Progress Notes (Signed)
Office Visit Note   Patient: Julia [name]           Date of Birth: [birth year]           MRN: [id number] Visit Date: 03/09/2020              Requested by: Lavada Mesi, MD [address] [place],  [place] [address] PCP: Lavada Mesi, MD   Assessment & Plan: Visit Diagnoses:  1. Bilateral primary osteoarthritis of knee   2. Total knee replacement status, right     Plan: Nearly 3 months status post primary right total knee replacement.  Seen last week with a rather acute onset of knee pain.  Appears that this was related to increased exercises.  She is doing fine at this point and not using any ambulatory aid.  She denies any fever or chills.  Exam of the right knee was relatively benign.  Would like to see her again in a month and we have discussed activity limitations and gradual increase in her exercises  Follow-Up Instructions: Return in about 1 month (around 04/09/2020).   Orders:  No orders of the defined types were placed in this encounter.  No orders of the defined types were placed in this encounter.     Procedures: No procedures performed   Clinical Data: No additional findings.   Subjective: Chief Complaint  Patient presents with  . Right Knee - Follow-up    Right total knee arthroplasty 12/14/2019  Patient presents today for follow up on her right knee. She is now 12 weeks out from a right total knee arthroplasty. Her surgery was on 12/14/2019. She was here last week due to increased pain in her knee. She states that she feeling much better. She is not having to walk with a cane today. She has not been to physical therapy lately and feels like that may have helped. She is taking ibuprofen for pain.   HPI  Review of Systems   Objective: Vital Signs: Ht 5\' 8"  (1.727 m)   Wt 259 lb (117.5 kg)   BMI 39.38 kg/m   Physical Exam  Ortho Exam right knee was not warmer than the left knee.  No effusion.  Full extension and flexion about 95 to 100 degrees.  No  instability.  No obvious effusion.  Wound healing without a problem.  No popliteal pain or mass.  No calf pain.  Straight leg raise negative.  Painless range of motion of right hip.  No localized areas of tenderness about the right knee  Specialty Comments:  No specialty comments available.  Imaging: No results found.   PMFS History: Patient Active Problem List   Diagnosis Date Noted  . Total knee replacement status, right 12/14/2019  . Class 2 obesity due to excess calories without serious comorbidity with body mass index (BMI) of 39.0 to 39.9 in adult 11/05/2019  . Unilateral primary osteoarthritis, right knee 11/05/2019  . Unilateral primary osteoarthritis, left knee 11/05/2019  . Bilateral primary osteoarthritis of knee 01/25/2019   Past Medical History:  Diagnosis Date  . Anxiety   . Arthritis    knees, hands  . Depression     Family History  Problem Relation Age of Onset  . Cancer Mother     Past Surgical History:  Procedure Laterality Date  . ABDOMINAL HYSTERECTOMY    . KNEE ARTHROSCOPY    . 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   Procedure: RIGHT TOTAL KNEE ARTHROPLASTY;  Surgeon: 02/13/2020, MD;  Location: [facility];  Service: Orthopedics;  Laterality: Right;   Social History   Occupational History  . Not on file  Tobacco Use  . Smoking status: Former Smoker    Packs/day: 1.00    Years: 15.00    Pack years: 15.00    Types: Cigarettes    Quit date: 12/02/2012    Years since quitting: 7.2  . Smokeless tobacco: Never Used  Vaping Use  . Vaping Use: Never used  Substance and Sexual Activity  . Alcohol use: Not Currently  . Drug use: Never  . Sexual activity: Yes

## 2020-03-10 ENCOUNTER — Encounter: Payer: Self-pay | Admitting: Physical Therapy

## 2020-03-10 ENCOUNTER — Other Ambulatory Visit: Payer: Self-pay

## 2020-03-10 ENCOUNTER — Ambulatory Visit: Payer: Managed Care, Other (non HMO) | Admitting: Physical Therapy

## 2020-03-10 DIAGNOSIS — M25561 Pain in right knee: Secondary | ICD-10-CM

## 2020-03-10 DIAGNOSIS — M6281 Muscle weakness (generalized): Secondary | ICD-10-CM

## 2020-03-10 DIAGNOSIS — R2689 Other abnormalities of gait and mobility: Secondary | ICD-10-CM

## 2020-03-10 DIAGNOSIS — M25661 Stiffness of right knee, not elsewhere classified: Secondary | ICD-10-CM

## 2020-03-10 DIAGNOSIS — R600 Localized edema: Secondary | ICD-10-CM

## 2020-03-10 NOTE — Therapy (Signed)
[facility] [address] [place], [place], [address] Phone: [phone]   Fax:  [phone]  Physical Therapy Treatment  Patient Details  Name: Julia [name] MRN: [id number] Date of Birth: [birth year] Referring Provider (PT): Cleophas Dunker, MD   Encounter Date: 03/10/2020   PT End of Session - 03/10/20 1139    Visit Number 13    Number of Visits 23    Date for PT Re-Evaluation 03/30/20    Authorization Type [employer]    Authorization - Visit Number 7    Authorization - Number of Visits 23    PT Start Time 1130    PT Stop Time 1225    PT Time Calculation (min) 55 min    Activity Tolerance Patient tolerated treatment well    Behavior During Therapy [facility] for tasks assessed/performed           Past Medical History:  Diagnosis Date  . Anxiety   . Arthritis    knees, hands  . Depression     Past Surgical History:  Procedure Laterality Date  . ABDOMINAL HYSTERECTOMY    . KNEE ARTHROSCOPY    . 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   Procedure: RIGHT TOTAL KNEE ARTHROPLASTY;  Surgeon: Valeria Batman, MD;  Location: [facility];  Service: Orthopedics;  Laterality: Right;    There were no vitals filed for this visit.   Subjective Assessment - 03/10/20 1137    Subjective Patient reports she has been having some issues with her knee and has been back to the doctor twice due to increased swelling and discomfort. She was instructed to do less activity.    Patient Stated Goals Get back to normal and surf fishing    Currently in Pain? Yes    Pain Score 5     Pain Location Knee    Pain Orientation Right    Pain Descriptors / Indicators Aching;Sore    Pain Type Surgical pain    Pain Onset More than a month ago    Pain Frequency Constant              [facility] PT Assessment - 03/10/20 0001      Assessment   Medical Diagnosis Rt TKA    Referring Provider (PT) Cleophas Dunker, MD      AROM   Right Knee Extension 4   lacking   Right  Knee Flexion 110                         [facility] Adult PT Treatment/Exercise - 03/10/20 0001      Exercises   Exercises Knee/Hip      Knee/Hip Exercises: Stretches   Passive Hamstring Stretch 2 reps;30 seconds    Passive Hamstring Stretch Limitations supine PROM    Hip Flexor Stretch 3 reps;30 seconds    Hip Flexor Stretch Limitations supine edge of mat PROM    Gastroc Stretch 2 reps;30 seconds    Gastroc Stretch Limitations slant board      Knee/Hip Exercises: Aerobic   Nustep L5 x 6 min (LE only)      Knee/Hip Exercises: Seated   Long Arc Quad 2 sets;10 reps      Knee/Hip Exercises: Supine   Straight Leg Raises 2 sets;10 reps    Straight Leg Raises Limitations cued for quad set, slow and contolled      Modalities   Modalities Vasopneumatic      Vasopneumatic   Number Minutes Vasopneumatic  15 minutes    Vasopnuematic Location  Knee    Vasopneumatic Pressure Medium    Vasopneumatic Temperature  34      Manual Therapy   Manual Therapy Joint mobilization;Passive ROM;Soft tissue mobilization    Joint Mobilization Patellar mobs all directions, tibiofemoral mobs in supine and seated to improve knee flexion and extension    Soft tissue mobilization Roller to quad in thomas position    Passive ROM Knee flexion and extension to tolerance, stretching                  PT Education - 03/10/20 1139    Education Details HEP, activity modification    Person(s) Educated Patient    Methods Explanation;Demonstration;Verbal cues    Comprehension Verbalized understanding;Returned demonstration;Verbal cues required;Need further instruction               PT Long Term Goals - 01/06/20 1338      PT LONG TERM GOAL #1   Title Pt will be I and compliant with HEP.    Time 12    Period Weeks    Status New    Target Date 03/30/20      PT LONG TERM GOAL #2   Title Pt will improve Rt knee ROM 3-110 degrees    Time 12    Period Weeks    Status New      PT  LONG TERM GOAL #3   Title Pt will improve Rt knee strength to 5/5 MMT to improve function    Time 12    Period Weeks    Status New      PT LONG TERM GOAL #4   Title Pt will be able to ambulate community distances >500 ft independently, no AD    Time 12    Period Weeks    Status New                 Plan - 03/10/20 1140    Clinical Impression Statement Patient tolerated therapy well with no adverse effects. Therapy focused on more manual and stretching this visit as patient reported increased swelling and discomfort with weight bearing tasks. She has maintained her AROM since last visit which is good given her recent exacerbation of symtpoms. She was encoruaged to continue exercises as tolerated at home and gradually return to prior level of function. She would benefit from continued skilled PT to progress motion and strength in order to improve walking ability and return to prior level of function.    PT Treatment/Interventions Aquatic Therapy;Cryotherapy;Electrical Stimulation;Iontophoresis 4mg /ml Dexamethasone;Moist Heat;Ultrasound;Gait training;Stair training;Functional mobility training;Therapeutic activities;Therapeutic exercise;Balance training;Neuromuscular re-education;Manual techniques;Passive range of motion;Dry needling;Scar mobilization;Joint Manipulations;Vasopneumatic Device;Taping    PT Next Visit Plan Assess HEP and progress PRN, continue manual and stretching for knee motion, progress strengthening and add to HEP as tolerated, vaso. Consider BFR.    PT Home Exercise Plan [email or web address]: supine heel slides with strap, supine hip flexor/quad stretch with strap, seated hamstring stretch, seated knee AROM heel slide, standing lunge knee flexion stretch, standing calf stretch; SLR, bridge, sidelying hip abduction, LAQ, hamstring curl with green, sit<>stand, standing heel raises, SLS with EC    Consulted and Agree with Plan of Care Patient           Patient will benefit from  skilled therapeutic intervention in order to improve the following deficits and impairments:  Abnormal gait, Decreased activity tolerance, Decreased balance, Decreased endurance, Decreased mobility, Decreased range of motion, Decreased strength, Difficulty walking, Hypomobility, Increased  edema, Increased fascial restricitons, Pain  Visit Diagnosis: Acute pain of right knee  Stiffness of right knee, not elsewhere classified  Muscle weakness (generalized)  Other abnormalities of gait and mobility  Localized edema     Problem List Patient Active Problem List   Diagnosis Date Noted  . Total knee replacement status, right 12/14/2019  . Class 2 obesity due to excess calories without serious comorbidity with body mass index (BMI) of 39.0 to 39.9 in adult 11/05/2019  . Unilateral primary osteoarthritis, right knee 11/05/2019  . Unilateral primary osteoarthritis, left knee 11/05/2019  . Bilateral primary osteoarthritis of knee 01/25/2019    Rosana Hoes, PT, DPT, LAT, ATC 03/10/20  12:15 PM Phone: [phone] Fax: [phone]   [facility] Outpatient Rehabilitation [facility] [address] [place], [place], [address] Phone: [phone]   Fax:  [phone]  Name: Julia [name] MRN: [id number] Date of Birth: 01-31-1966

## 2020-03-14 ENCOUNTER — Encounter: Payer: Self-pay | Admitting: Physical Therapy

## 2020-03-14 ENCOUNTER — Ambulatory Visit: Payer: Managed Care, Other (non HMO) | Admitting: Physical Therapy

## 2020-03-14 ENCOUNTER — Other Ambulatory Visit: Payer: Self-pay

## 2020-03-14 DIAGNOSIS — M6281 Muscle weakness (generalized): Secondary | ICD-10-CM

## 2020-03-14 DIAGNOSIS — M25561 Pain in right knee: Secondary | ICD-10-CM

## 2020-03-14 DIAGNOSIS — R600 Localized edema: Secondary | ICD-10-CM

## 2020-03-14 DIAGNOSIS — M25661 Stiffness of right knee, not elsewhere classified: Secondary | ICD-10-CM

## 2020-03-14 DIAGNOSIS — R2689 Other abnormalities of gait and mobility: Secondary | ICD-10-CM

## 2020-03-14 NOTE — Therapy (Signed)
[facility] Outpatient Rehabilitation [facility] [address] [place], [place], [address] Phone: [phone]   Fax:  [phone]  Physical Therapy Treatment  Patient Details  Name: Julia [name] MRN: [id number] Date of Birth: 10-02-1965 Referring Provider (PT): Cleophas Dunker, MD   Encounter Date: 03/14/2020   PT End of Session - 03/14/20 1137    Visit Number 14    Number of Visits 23    Date for PT Re-Evaluation 03/30/20    Authorization Type [employer]    PT Start Time 1130    PT Stop Time 1215    PT Time Calculation (min) 45 min    Activity Tolerance Patient tolerated treatment well    Behavior During Therapy [facility] for tasks assessed/performed           Past Medical History:  Diagnosis Date   Anxiety    Arthritis    knees, hands   Depression     Past Surgical History:  Procedure Laterality Date   ABDOMINAL HYSTERECTOMY     KNEE ARTHROSCOPY     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   Procedure: RIGHT TOTAL KNEE ARTHROPLASTY;  Surgeon: Valeria Batman, MD;  Location: [facility];  Service: Orthopedics;  Laterality: Right;    There were no vitals filed for this visit.   Subjective Assessment - 03/14/20 1134    Subjective Patient reports she is doing well this visit, she is feeling a little stiff. She continues to have trouble getting comfortable for sleeping.    Patient Stated Goals Get back to normal and surf fishing    Currently in Pain? Yes    Pain Score 4     Pain Location Knee    Pain Orientation Right    Pain Descriptors / Indicators Tightness    Pain Type Surgical pain    Pain Onset More than a month ago    Pain Frequency Constant              [facility] PT Assessment - 03/14/20 0001      Assessment   Medical Diagnosis Rt TKA    Referring Provider (PT) Cleophas Dunker, MD    Onset Date/Surgical Date 12/14/19      AROM   Right Knee Flexion 110      PROM   Right Knee Flexion 112                         [facility] Adult PT  Treatment/Exercise - 03/14/20 0001      Exercises   Exercises Knee/Hip      Knee/Hip Exercises: Stretches   Lobbyist 3 reps;30 seconds    Quad Stretch Limitations prone PROM    Hip Flexor Stretch 3 reps;30 seconds    Hip Flexor Stretch Limitations supine edge of mat PROM    Knee: Self-Stretch Limitations Standing lunge stretch with foot on step 5 x10 sec hold    Gastroc Stretch 2 reps;30 seconds    Gastroc Stretch Limitations slant board      Knee/Hip Exercises: Aerobic   Nustep L5 x 6 min (LE only)      Knee/Hip Exercises: Machines for Strengthening   Cybex Leg Press 55# 3x10 with stagegered stance (right lower)      Knee/Hip Exercises: Standing   Lateral Step Up 2 sets;10 reps    Lateral Step Up Limitations 6" step-height    Forward Step Up 2 sets;10 reps    Forward Step Up Limitations 6" step-height  Knee/Hip Exercises: Supine   Straight Leg Raises 2 sets;10 reps      Modalities   Modalities Vasopneumatic      Vasopneumatic   Number Minutes Vasopneumatic  10 minutes    Vasopnuematic Location  Knee   right   Vasopneumatic Pressure Medium    Vasopneumatic Temperature  34      Manual Therapy   Manual Therapy Joint mobilization;Passive ROM;Soft tissue mobilization    Joint Mobilization Patellar mobs all directions, tibiofemoral mobs in supine and seated to improve knee flexion and extension    Soft tissue mobilization Roller to quad in thomas position    Passive ROM Knee flexion and extension to tolerance, stretching                  PT Education - 03/14/20 1136    Education Details HEP    Person(s) Educated Patient    Methods Explanation    Comprehension Verbalized understanding;Need further instruction               PT Long Term Goals - 01/06/20 1338      PT LONG TERM GOAL #1   Title Pt will be I and compliant with HEP.    Time 12    Period Weeks    Status New    Target Date 03/30/20      PT LONG TERM GOAL #2   Title Pt will  improve Rt knee ROM 3-110 degrees    Time 12    Period Weeks    Status New      PT LONG TERM GOAL #3   Title Pt will improve Rt knee strength to 5/5 MMT to improve function    Time 12    Period Weeks    Status New      PT LONG TERM GOAL #4   Title Pt will be able to ambulate community distances >500 ft independently, no AD    Time 12    Period Weeks    Status New                 Plan - 03/14/20 1137    Clinical Impression Statement Patient tolerated therapy well with no adverse effects. Continued working on motion and strength this visit with good tolerance. She continues to exhibit 110 deg knee motion and she was given an additional stretch to improve knee flexion at home. She did not report any increase pain this visit, used vaso post session to control swelling and soreness. She would benefit from continued skilled PT to progress motion and strength in order to improve walking ability and return to prior level of function.    PT Treatment/Interventions Aquatic Therapy;Cryotherapy;Electrical Stimulation;Iontophoresis 4mg /ml Dexamethasone;Moist Heat;Ultrasound;Gait training;Stair training;Functional mobility training;Therapeutic activities;Therapeutic exercise;Balance training;Neuromuscular re-education;Manual techniques;Passive range of motion;Dry needling;Scar mobilization;Joint Manipulations;Vasopneumatic Device;Taping    PT Next Visit Plan Assess HEP and progress PRN, continue manual and stretching for knee motion, progress strengthening and add to HEP as tolerated, vaso. Consider BFR.    PT Home Exercise Plan [email or web address]: supine heel slides with strap, supine hip flexor/quad stretch with strap, seated hamstring stretch, supine hamstring/ITB stretch with strap, seated knee AROM heel slide, standing lunge knee flexion stretch, standing calf stretch; SLR, bridge, sidelying hip abduction, LAQ, hamstring curl with green, sit<>stand, standing heel raises, SLS with EC    Consulted and  Agree with Plan of Care Patient           Patient will benefit from skilled therapeutic intervention in order  to improve the following deficits and impairments:  Abnormal gait, Decreased activity tolerance, Decreased balance, Decreased endurance, Decreased mobility, Decreased range of motion, Decreased strength, Difficulty walking, Hypomobility, Increased edema, Increased fascial restricitons, Pain  Visit Diagnosis: Acute pain of right knee  Stiffness of right knee, not elsewhere classified  Muscle weakness (generalized)  Other abnormalities of gait and mobility  Localized edema     Problem List Patient Active Problem List   Diagnosis Date Noted   Total knee replacement status, right 12/14/2019   Class 2 obesity due to excess calories without serious comorbidity with body mass index (BMI) of 39.0 to 39.9 in adult 11/05/2019   Unilateral primary osteoarthritis, right knee 11/05/2019   Unilateral primary osteoarthritis, left knee 11/05/2019   Bilateral primary osteoarthritis of knee 01/25/2019    Rosana Hoes, PT, DPT, LAT, ATC 03/14/20  12:22 PM Phone: [phone] Fax: [phone]   [facility] [address] [place], [place], [address] Phone: [phone]   Fax:  [phone]  Name: Julia [name] MRN: [id number] Date of Birth: [birth year]

## 2020-03-14 NOTE — Patient Instructions (Signed)
Access Code: [email or web address] URL: [email or web address] Date: 03/14/2020 Prepared by: Rosana Hoes  Exercises Supine Heel Slide with Strap - 3-4 x daily - 6 x weekly - 5 reps - 10 seconds hold Supine Quadriceps Stretch with Strap on Table - 3-4 x daily - 7 x weekly - 3 reps - 20 seconds hold Seated Knee Flexion Extension AROM - 3-4 x daily - 6 x weekly - 10 reps Seated Hamstring Stretch - 3-4 x daily - 7 x weekly - 3 reps - 20 seconds hold Standing Knee Flexion Stretch on Step - 3-4 x daily - 7 x weekly - 5 reps - 10 seconds hold Standing Gastroc Stretch at Counter - 3-4 x daily - 7 x weekly - 3 reps - 30 seconds hold Active Straight Leg Raise with Quad Set - 1 x daily - 5 x weekly - 2 sets - 10 reps Supine Bridge - 1 x daily - 5 x weekly - 2 sets - 10 reps Sidelying Hip Abduction - 1 x daily - 5 x weekly - 2 sets - 10 reps Seated Hamstring Curl with Anchored Resistance - 1 x daily - 5 x weekly - 2 sets - 10 reps Seated Long Arc Quad - 1 x daily - 5 x weekly - 2 sets - 10 reps Sit to Stand without Arm Support - 1 x daily - 5 x weekly - 2 sets - 10 reps Heel rises with counter support - 1 x daily - 5 x weekly - 2 sets - 10 reps Standing Knee Flexion Stretch on Step - 2 x daily - 5 reps - 10-15 seconds hold Supine ITB Stretch with Strap - 2 x daily - 2 reps - 20 seconds hold

## 2020-03-17 ENCOUNTER — Encounter: Payer: Self-pay | Admitting: Physical Therapy

## 2020-03-17 ENCOUNTER — Ambulatory Visit: Payer: Managed Care, Other (non HMO) | Attending: Family Medicine | Admitting: Physical Therapy

## 2020-03-17 ENCOUNTER — Other Ambulatory Visit: Payer: Self-pay

## 2020-03-17 DIAGNOSIS — M6281 Muscle weakness (generalized): Secondary | ICD-10-CM

## 2020-03-17 DIAGNOSIS — R600 Localized edema: Secondary | ICD-10-CM

## 2020-03-17 DIAGNOSIS — R2689 Other abnormalities of gait and mobility: Secondary | ICD-10-CM | POA: Diagnosis present

## 2020-03-17 DIAGNOSIS — M25561 Pain in right knee: Secondary | ICD-10-CM | POA: Insufficient documentation

## 2020-03-17 DIAGNOSIS — M25661 Stiffness of right knee, not elsewhere classified: Secondary | ICD-10-CM

## 2020-03-17 NOTE — Therapy (Signed)
[facility] Outpatient Rehabilitation [facility] [address] [place], [place], [address] Phone: [phone]   Fax:  [phone]  Physical Therapy Treatment  Patient Details  Name: Julia [name] MRN: [id number] Date of Birth: [birth year] Referring Provider (PT): Cleophas Dunker, MD   Encounter Date: 03/17/2020   PT End of Session - 03/17/20 1135    Visit Number 15    Number of Visits 23    Date for PT Re-Evaluation 03/30/20    Authorization Type [employer]    Authorization - Visit Number 8    Authorization - Number of Visits 23    PT Start Time 1128    PT Stop Time 1217    PT Time Calculation (min) 49 min    Activity Tolerance Patient tolerated treatment well    Behavior During Therapy [facility] for tasks assessed/performed           Past Medical History:  Diagnosis Date   Anxiety    Arthritis    knees, hands   Depression     Past Surgical History:  Procedure Laterality Date   ABDOMINAL HYSTERECTOMY     KNEE ARTHROSCOPY     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   Procedure: RIGHT TOTAL KNEE ARTHROPLASTY;  Surgeon: Valeria Batman, MD;  Location: [facility];  Service: Orthopedics;  Laterality: Right;    There were no vitals filed for this visit.   Subjective Assessment - 03/17/20 1130    Subjective Patient reports she went to the mall yesterday and may have walked a little too much. She is sore and stiff today.    Patient Stated Goals Get back to normal and surf fishing    Currently in Pain? Yes    Pain Score 3     Pain Location Knee    Pain Orientation Right    Pain Descriptors / Indicators Tightness;Sore    Pain Type Surgical pain    Pain Onset More than a month ago    Pain Frequency Constant              [facility] PT Assessment - 03/17/20 0001      Assessment   Medical Diagnosis Rt TKA    Referring Provider (PT) Cleophas Dunker, MD    Onset Date/Surgical Date 12/14/19      AROM   Right Knee Flexion 113                         [facility]  Adult PT Treatment/Exercise - 03/17/20 0001      Exercises   Exercises Knee/Hip      Knee/Hip Exercises: Stretches   Hip Flexor Stretch 3 reps;30 seconds    Hip Flexor Stretch Limitations supine edge of mat PROM    Knee: Self-Stretch Limitations Standing lunge stretch with foot on step 3 x10 sec hold    Gastroc Stretch 2 reps;30 seconds    Gastroc Stretch Limitations slant board      Knee/Hip Exercises: Aerobic   Nustep L5 x 6 min (LE only)      Knee/Hip Exercises: Machines for Strengthening   Cybex Leg Press 55# 2x10 with stagegered stance (right lower)      Knee/Hip Exercises: Standing   Forward Step Up 2 sets;10 reps    Forward Step Up Limitations 6" step-height      Knee/Hip Exercises: Supine   Straight Leg Raises 10 reps      Modalities   Modalities Vasopneumatic      Vasopneumatic   Number  Minutes Vasopneumatic  10 minutes    Vasopnuematic Location  Knee   right   Vasopneumatic Pressure Medium    Vasopneumatic Temperature  34      Manual Therapy   Manual Therapy Joint mobilization;Passive ROM;Soft tissue mobilization    Joint Mobilization Patellar mobs all directions, tibiofemoral mobs in supine and seated to improve knee flexion and extension    Soft tissue mobilization Roller to quad in thomas position    Passive ROM Knee flexion and extension to tolerance, stretching                  PT Education - 03/17/20 1135    Education Details HEP    Person(s) Educated Patient    Methods Explanation    Comprehension Verbalized understanding;Need further instruction               PT Long Term Goals - 01/06/20 1338      PT LONG TERM GOAL #1   Title Pt will be I and compliant with HEP.    Time 12    Period Weeks    Status New    Target Date 03/30/20      PT LONG TERM GOAL #2   Title Pt will improve Rt knee ROM 3-110 degrees    Time 12    Period Weeks    Status New      PT LONG TERM GOAL #3   Title Pt will improve Rt knee strength to 5/5 MMT to  improve function    Time 12    Period Weeks    Status New      PT LONG TERM GOAL #4   Title Pt will be able to ambulate community distances >500 ft independently, no AD    Time 12    Period Weeks    Status New                 Plan - 03/17/20 1136    Clinical Impression Statement Patient tolerated therapy well with no adverse effects. She reported increased stiffness this visit so focused primarily on manual and stretching with good tolerance. Patient did exhibit improved active knee motion this visit. She is progressing well with her strengthening and reports she is increasing activity level and walking. She would benefit from continued skilled PT to progress motion and strength in order to improve walking ability and return to prior level of function.    PT Treatment/Interventions Aquatic Therapy;Cryotherapy;Electrical Stimulation;Iontophoresis 4mg /ml Dexamethasone;Moist Heat;Ultrasound;Gait training;Stair training;Functional mobility training;Therapeutic activities;Therapeutic exercise;Balance training;Neuromuscular re-education;Manual techniques;Passive range of motion;Dry needling;Scar mobilization;Joint Manipulations;Vasopneumatic Device;Taping    PT Next Visit Plan Assess HEP and progress PRN, continue manual and stretching for knee motion, progress strengthening and add to HEP as tolerated, vaso. Consider BFR.    PT Home Exercise Plan [email or web address]: supine heel slides with strap, supine hip flexor/quad stretch with strap, seated hamstring stretch, supine hamstring/ITB stretch with strap, seated knee AROM heel slide, standing lunge knee flexion stretch, standing calf stretch; SLR, bridge, sidelying hip abduction, LAQ, hamstring curl with green, sit<>stand, standing heel raises, SLS with EC    Consulted and Agree with Plan of Care Patient           Patient will benefit from skilled therapeutic intervention in order to improve the following deficits and impairments:  Abnormal gait,  Decreased activity tolerance, Decreased balance, Decreased endurance, Decreased mobility, Decreased range of motion, Decreased strength, Difficulty walking, Hypomobility, Increased edema, Increased fascial restricitons, Pain  Visit Diagnosis: Acute pain  of right knee  Stiffness of right knee, not elsewhere classified  Muscle weakness (generalized)  Other abnormalities of gait and mobility  Localized edema     Problem List Patient Active Problem List   Diagnosis Date Noted   Total knee replacement status, right 12/14/2019   Class 2 obesity due to excess calories without serious comorbidity with body mass index (BMI) of 39.0 to 39.9 in adult 11/05/2019   Unilateral primary osteoarthritis, right knee 11/05/2019   Unilateral primary osteoarthritis, left knee 11/05/2019   Bilateral primary osteoarthritis of knee 01/25/2019    Rosana Hoes, PT, DPT, LAT, ATC 03/17/20  12:12 PM Phone: [phone] Fax: [phone]   [facility] Out[facility] [address] [place], [place], [address] Phone: [phone]   Fax:  [phone]  Name: ERIENNE SPELMAN MRN: [id number] Date of Birth: 1966/06/18

## 2020-03-21 ENCOUNTER — Other Ambulatory Visit: Payer: Self-pay | Admitting: Orthopaedic Surgery

## 2020-03-21 MED ORDER — HYDROCODONE-ACETAMINOPHEN 5-325 MG PO TABS: 1.0000 | ORAL_TABLET | Freq: Four times a day (QID) | ORAL | 0 refills | Status: DC | PRN

## 2020-03-21 NOTE — Telephone Encounter (Signed)
Please advise 

## 2020-03-21 NOTE — Telephone Encounter (Signed)
Called and notified patient via voicemail.  

## 2020-03-21 NOTE — Telephone Encounter (Signed)
sent 

## 2020-03-28 ENCOUNTER — Other Ambulatory Visit: Payer: Self-pay

## 2020-03-28 ENCOUNTER — Ambulatory Visit: Payer: Managed Care, Other (non HMO) | Admitting: Physical Therapy

## 2020-03-28 ENCOUNTER — Encounter: Payer: Self-pay | Admitting: Physical Therapy

## 2020-03-28 DIAGNOSIS — M6281 Muscle weakness (generalized): Secondary | ICD-10-CM

## 2020-03-28 DIAGNOSIS — M25661 Stiffness of right knee, not elsewhere classified: Secondary | ICD-10-CM

## 2020-03-28 DIAGNOSIS — R2689 Other abnormalities of gait and mobility: Secondary | ICD-10-CM

## 2020-03-28 DIAGNOSIS — R600 Localized edema: Secondary | ICD-10-CM

## 2020-03-28 DIAGNOSIS — M25561 Pain in right knee: Secondary | ICD-10-CM | POA: Diagnosis not present

## 2020-03-28 NOTE — Patient Instructions (Signed)
Access Code: [email or web address] URL: [email or web address] Date: 03/28/2020 Prepared by: Rosana Hoes  Exercises Supine Heel Slide with Strap - 2 x daily - 5 reps - 10 seconds hold Supine Quadriceps Stretch with Strap on Table - 2 x daily - 2 reps - 30 seconds hold Supine ITB Stretch with Strap - 2 x daily - 2 reps - 30 seconds hold Seated Hamstring Stretch - 2 x daily - 2 reps - 30 seconds hold Standing Gastroc Stretch at Counter - 2 x daily - 2 reps - 30 seconds hold Standing Knee Flexion Stretch on Step - 2 x daily - 5 reps - 10 hold Active Straight Leg Raise with Quad Set - 3-4 x weekly - 2 sets - 10 reps Seated Hamstring Curl with Anchored Resistance - 3-4 x weekly - 2 sets - 10 reps Seated Long Arc Quad - 3-4 x weekly - 2 sets - 10 reps Sit to Stand without Arm Support - 3-4 x weekly - 2 sets - 10 reps Heel rises with counter support - 3-4 x weekly - 2 sets - 20 reps Hip Abduction with Resistance Loop - 3-4 x weekly - 2 sets - 10 reps Hip Extension with Resistance Loop - 3-4 x weekly - 2 sets - 10 reps Step Up - 3-4 x weekly - 2 sets - 10 reps Single Leg Balance with Eyes Closed - 3-4 x weekly - 3 sets - 30 seconds hold

## 2020-03-29 ENCOUNTER — Encounter: Payer: Self-pay | Admitting: Physical Therapy

## 2020-03-29 NOTE — Therapy (Signed)
[facility] Outpatient Rehabilitation [facility] [address] [place], [place], [address] Phone: [phone]   Fax:  [phone]  Physical Therapy Treatment / ERO   Progress Note Reporting Period 01/06/2020 to 03/28/2020  See note below for Objective Data and Assessment of Progress/Goals.    Patient Details  Name: Julia [name] MRN: [id number] Date of Birth: 12/23/1965 Referring Provider (PT): Cleophas Dunker, MD   Encounter Date: 03/28/2020   PT End of Session - 03/28/20 1705    Visit Number 16    Number of Visits 22    Date for PT Re-Evaluation 05/09/20    Authorization Type [employer]    Authorization - Visit Number --    Authorization - Number of Visits --    PT Start Time 1700    PT Stop Time 1745    PT Time Calculation (min) 45 min    Activity Tolerance Patient tolerated treatment well    Behavior During Therapy [facility] for tasks assessed/performed           Past Medical History:  Diagnosis Date  . Anxiety   . Arthritis    knees, hands  . Depression     Past Surgical History:  Procedure Laterality Date  . ABDOMINAL HYSTERECTOMY    . KNEE ARTHROSCOPY    . 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   Procedure: RIGHT TOTAL KNEE ARTHROPLASTY;  Surgeon: Valeria Batman, MD;  Location: [facility];  Service: Orthopedics;  Laterality: Right;    There were no vitals filed for this visit.   Subjective Assessment - 03/28/20 1702    Subjective Patient reports she hasn't been doing much due to flare up of anxiety and depression. She reports the knee is feeling a little heavy and stiff. States she has pain going to bed and it is hard to get comfortable.    Patient Stated Goals Get back to normal and surf fishing    Currently in Pain? Yes    Pain Score 3     Pain Location Knee    Pain Orientation Right    Pain Descriptors / Indicators Aching;Tightness    Pain Type Surgical pain    Pain Onset More than a month ago    Pain Frequency Constant              [facility]  PT Assessment - 03/29/20 0001      Assessment   Medical Diagnosis Rt TKA    Referring Provider (PT) Cleophas Dunker, MD    Onset Date/Surgical Date 12/14/19      Precautions   Precautions None      Restrictions   Weight Bearing Restrictions No      Balance Screen   Has the patient fallen in the past 6 months No      Prior Function   Level of Independence Independent      Cognition   Overall Cognitive Status Within Functional Limits for tasks assessed      Observation/Other Assessments   Observations Patient appears in no apparent distress    Focus on Therapeutic Outcomes (FOTO)  NA - not set up      AROM   Right Knee Extension 3   lacking   Right Knee Flexion 113      Strength   Overall Strength Comments Right knee strength grossly 4+/5 MMT, Right hip strength grossly 4/5 MMT                         [facility]  Adult PT Treatment/Exercise - 03/29/20 0001      Exercises   Exercises Knee/Hip      Knee/Hip Exercises: Stretches   Passive Hamstring Stretch 3 reps;30 seconds    Passive Hamstring Stretch Limitations supine PROM    Hip Flexor Stretch 3 reps;30 seconds    Hip Flexor Stretch Limitations supine edge of mat PROM    Knee: Self-Stretch Limitations Standing lunge stretch with foot on step 3 x 10 sec hold    Gastroc Stretch 3 reps;30 seconds    Gastroc Stretch Limitations slant board      Knee/Hip Exercises: Aerobic   Nustep L5 x 6 min (LE only)      Knee/Hip Exercises: Machines for Strengthening   Cybex Leg Press 55# 3x10 with stagegered stance (right lower)      Knee/Hip Exercises: Standing   Heel Raises 2 sets;15 reps    Hip Abduction 2 sets;10 reps    Abduction Limitations red band    Hip Extension 2 sets;10 reps    Extension Limitations red band    Forward Step Up 2 sets;10 reps    Forward Step Up Limitations 6" step-height      Manual Therapy   Manual Therapy Joint mobilization;Passive ROM;Soft tissue mobilization    Joint Mobilization  Patellar mobs all directions, tibiofemoral mobs in supine and seated to improve knee flexion and extension    Soft tissue mobilization Roller to quad in thomas position    Passive ROM Knee flexion and extension to tolerance, stretching                  PT Education - 03/28/20 1705    Education Details POC, HEP update    Person(s) Educated Patient    Methods Explanation;Demonstration;Handout;Verbal cues    Comprehension Verbalized understanding;Need further instruction;Returned demonstration;Verbal cues required               PT Long Term Goals - 03/29/20 1610      PT LONG TERM GOAL #1   Title Pt will be I and compliant with HEP.    Time 6    Period Weeks    Status On-going    Target Date 05/09/20      PT LONG TERM GOAL #2   Title Pt will improve Rt knee ROM 3-110 degrees    Time 12    Period Weeks    Status Achieved      PT LONG TERM GOAL #3   Title Pt will improve Rt knee strength to 5/5 MMT to improve function    Time 6    Period Weeks    Status On-going    Target Date 05/09/20      PT LONG TERM GOAL #4   Title Pt will be able to ambulate community distances >500 ft independently, no AD    Time 12    Period Weeks    Status Achieved                 Plan - 03/28/20 1707    Clinical Impression Statement Patient tolerated therapy well with no adverse effects. She reports not being as active over past few weeks so back down on exercises this visit and instructed her to lighten up exercises at home. She continues to exhibit limitation in knee flexion and with gross hip and knee strength limiting her walking ability. She is making continued progress toward goals and would benefit from continued skilled PT to progress motion and strength in order to  improve walking ability and return to prior level of function.    Examination-Activity Limitations Locomotion Level;Stairs;Squat;Stand;Lift    Examination-Participation Restrictions Community Activity;Shop     Rehab Potential Good    PT Frequency 1x / week    PT Duration 6 weeks    PT Treatment/Interventions Aquatic Therapy;Cryotherapy;Electrical Stimulation;Iontophoresis 4mg /ml Dexamethasone;Moist Heat;Ultrasound;Gait training;Stair training;Functional mobility training;Therapeutic activities;Therapeutic exercise;Balance training;Neuromuscular re-education;Manual techniques;Passive range of motion;Dry needling;Scar mobilization;Joint Manipulations;Vasopneumatic Device;Taping    PT Next Visit Plan Assess HEP and progress PRN, continue manual and stretching for knee motion, progress strengthening and add to HEP as tolerated, vaso    PT Home Exercise Plan [id number]    Consulted and Agree with Plan of Care Patient           Patient will benefit from skilled therapeutic intervention in order to improve the following deficits and impairments:  Abnormal gait, Decreased activity tolerance, Decreased balance, Decreased endurance, Decreased mobility, Decreased range of motion, Decreased strength, Difficulty walking, Hypomobility, Increased edema, Increased fascial restricitons, Pain  Visit Diagnosis: Acute pain of right knee  Stiffness of right knee, not elsewhere classified  Muscle weakness (generalized)  Other abnormalities of gait and mobility  Localized edema     Problem List Patient Active Problem List   Diagnosis Date Noted  . Total knee replacement status, right 12/14/2019  . Class 2 obesity due to excess calories without serious comorbidity with body mass index (BMI) of 39.0 to 39.9 in adult 11/05/2019  . Unilateral primary osteoarthritis, right knee 11/05/2019  . Unilateral primary osteoarthritis, left knee 11/05/2019  . Bilateral primary osteoarthritis of knee 01/25/2019    01/27/2019, PT, DPT, LAT, ATC 03/29/20  8:25 AM Phone: [phone] Fax: [phone]   [facility] Outpatient Rehabilitation [facility] [address] [place], [place], [place] Phone:  [phone]   Fax:  [phone]  Name: Julia [name] MRN: Criss Alvine Date of Birth: 12/04/1965

## 2020-04-06 ENCOUNTER — Encounter: Payer: Self-pay | Admitting: Physical Therapy

## 2020-04-06 ENCOUNTER — Ambulatory Visit: Payer: Managed Care, Other (non HMO) | Admitting: Physical Therapy

## 2020-04-06 ENCOUNTER — Other Ambulatory Visit: Payer: Self-pay

## 2020-04-06 DIAGNOSIS — M25561 Pain in right knee: Secondary | ICD-10-CM

## 2020-04-06 DIAGNOSIS — M25661 Stiffness of right knee, not elsewhere classified: Secondary | ICD-10-CM

## 2020-04-06 DIAGNOSIS — M6281 Muscle weakness (generalized): Secondary | ICD-10-CM

## 2020-04-06 DIAGNOSIS — R2689 Other abnormalities of gait and mobility: Secondary | ICD-10-CM

## 2020-04-06 DIAGNOSIS — R600 Localized edema: Secondary | ICD-10-CM

## 2020-04-06 NOTE — Therapy (Signed)
[facility] Outpatient Rehabilitation [facility] [address] [place], [place], [address] Phone: [phone]   Fax:  [phone]  Physical Therapy Treatment  Patient Details  Name: Julia [name] MRN: [id number] Date of Birth: 17-Dec-1965 Referring Provider (PT): Cleophas Dunker, MD   Encounter Date: 04/06/2020   PT End of Session - 04/06/20 1043    Visit Number 17    Number of Visits 22    Date for PT Re-Evaluation 05/09/20    Authorization Type [employer]    PT Start Time 1045    PT Stop Time 1130    PT Time Calculation (min) 45 min    Activity Tolerance Patient tolerated treatment well    Behavior During Therapy [facility] for tasks assessed/performed           Past Medical History:  Diagnosis Date   Anxiety    Arthritis    knees, hands   Depression     Past Surgical History:  Procedure Laterality Date   ABDOMINAL HYSTERECTOMY     KNEE ARTHROSCOPY     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   Procedure: RIGHT TOTAL KNEE ARTHROPLASTY;  Surgeon: Valeria Batman, MD;  Location: [facility];  Service: Orthopedics;  Laterality: Right;    There were no vitals filed for this visit.   Subjective Assessment - 04/06/20 1046    Subjective Patient reports she is feeling better since last visit. States her left knee is limiting her more now with walking than her surgical knee.    Patient Stated Goals Get back to normal and surf fishing    Currently in Pain? Yes    Pain Score 1     Pain Location Knee    Pain Orientation Right    Pain Descriptors / Indicators Aching;Tightness    Pain Type Surgical pain    Pain Onset More than a month ago    Pain Frequency Constant              [facility] PT Assessment - 04/06/20 0001      AROM   Right Knee Flexion 113                         [facility] Adult PT Treatment/Exercise - 04/06/20 0001      Exercises   Exercises Knee/Hip      Knee/Hip Exercises: Stretches   Passive Hamstring Stretch 3 reps;30 seconds     Passive Hamstring Stretch Limitations supine PROM    Hip Flexor Stretch 3 reps;30 seconds    Hip Flexor Stretch Limitations supine edge of mat PROM    Knee: Self-Stretch Limitations Standing lunge stretch with foot on step 3 x 10 sec hold    Gastroc Stretch 3 reps;30 seconds    Gastroc Stretch Limitations slant board      Knee/Hip Exercises: Aerobic   Recumbent Bike L1 x 3 min for knee motion    Nustep L5 x 5 min (LE only)      Knee/Hip Exercises: Machines for Strengthening   Cybex Knee Extension 20# x10, SL 10# 2x10   patient reported left knee pain   Cybex Knee Flexion 25# x10, SL 15# 2x10    Cybex Leg Press 75# 3x8      Knee/Hip Exercises: Standing   Forward Step Up 2 sets;10 reps    Forward Step Up Limitations 8" step-height    Other Standing Knee Exercises Lateral band walk with red band around ankles 3 x 15  Manual Therapy   Manual Therapy Joint mobilization;Passive ROM;Soft tissue mobilization    Joint Mobilization Patellar mobs all directions, tibiofemoral mobs in supine and seated to improve knee flexion and extension    Soft tissue mobilization Roller to quad in thomas position    Passive ROM Knee flexion and extension to tolerance, stretching                  PT Education - 04/06/20 1043    Education Details HEP    Person(s) Educated Patient    Methods Explanation    Comprehension Verbalized understanding;Need further instruction               PT Long Term Goals - 03/29/20 0254      PT LONG TERM GOAL #1   Title Pt will be I and compliant with HEP.    Time 6    Period Weeks    Status On-going    Target Date 05/09/20      PT LONG TERM GOAL #2   Title Pt will improve Rt knee ROM 3-110 degrees    Time 12    Period Weeks    Status Achieved      PT LONG TERM GOAL #3   Title Pt will improve Rt knee strength to 5/5 MMT to improve function    Time 6    Period Weeks    Status On-going    Target Date 05/09/20      PT LONG TERM GOAL #4    Title Pt will be able to ambulate community distances >500 ft independently, no AD    Time 12    Period Weeks    Status Achieved                 Plan - 04/06/20 1043    Clinical Impression Statement Patient tolerated therapy well with no adverse effects. She demonstrates improvement in motion this visit and is progressing with her strengthening compared to previous appointment. She reported left knee pain this visit but did not report any right knee pain. She reports feeling strengthening exercises in muscles rather than knee. Patient would benefit from continued skilled PT to progress motion and strength in order to improve walking ability and return to prior level of function.    PT Treatment/Interventions Aquatic Therapy;Cryotherapy;Electrical Stimulation;Iontophoresis 4mg /ml Dexamethasone;Moist Heat;Ultrasound;Gait training;Stair training;Functional mobility training;Therapeutic activities;Therapeutic exercise;Balance training;Neuromuscular re-education;Manual techniques;Passive range of motion;Dry needling;Scar mobilization;Joint Manipulations;Vasopneumatic Device;Taping    PT Next Visit Plan Assess HEP and progress PRN, continue manual and stretching for knee motion, progress strengthening and add to HEP as tolerated, vaso    PT Home Exercise Plan [id number]    Consulted and Agree with Plan of Care Patient           Patient will benefit from skilled therapeutic intervention in order to improve the following deficits and impairments:  Abnormal gait, Decreased activity tolerance, Decreased balance, Decreased endurance, Decreased mobility, Decreased range of motion, Decreased strength, Difficulty walking, Hypomobility, Increased edema, Increased fascial restricitons, Pain  Visit Diagnosis: Acute pain of right knee  Stiffness of right knee, not elsewhere classified  Muscle weakness (generalized)  Other abnormalities of gait and mobility  Localized edema     Problem  List Patient Active Problem List   Diagnosis Date Noted   Total knee replacement status, right 12/14/2019   Class 2 obesity due to excess calories without serious comorbidity with body mass index (BMI) of 39.0 to 39.9 in adult 11/05/2019   Unilateral primary  osteoarthritis, right knee 11/05/2019   Unilateral primary osteoarthritis, left knee 11/05/2019   Bilateral primary osteoarthritis of knee 01/25/2019    Rosana Hoes, PT, DPT, LAT, ATC 04/06/20  11:30 AM Phone: [phone] Fax: [phone]   [facility] Outpatient Rehabilitation [facility] [address] [place], [place], [address] Phone: [phone]   Fax:  [phone]  Name: Julia [name] MRN: [id number] Date of Birth: [birth year]

## 2020-04-11 ENCOUNTER — Other Ambulatory Visit: Payer: Self-pay | Admitting: Orthopaedic Surgery

## 2020-04-11 ENCOUNTER — Other Ambulatory Visit: Payer: Self-pay | Admitting: Orthopedic Surgery

## 2020-04-11 NOTE — Telephone Encounter (Signed)
Please advise 

## 2020-04-11 NOTE — Telephone Encounter (Signed)
Please call re meds ?

## 2020-04-12 ENCOUNTER — Other Ambulatory Visit: Payer: Self-pay | Admitting: Orthopaedic Surgery

## 2020-04-12 ENCOUNTER — Other Ambulatory Visit: Payer: Self-pay

## 2020-04-12 ENCOUNTER — Encounter: Payer: Self-pay | Admitting: Physical Therapy

## 2020-04-12 ENCOUNTER — Ambulatory Visit: Payer: Managed Care, Other (non HMO) | Admitting: Physical Therapy

## 2020-04-12 DIAGNOSIS — M6281 Muscle weakness (generalized): Secondary | ICD-10-CM

## 2020-04-12 DIAGNOSIS — M25561 Pain in right knee: Secondary | ICD-10-CM | POA: Diagnosis not present

## 2020-04-12 DIAGNOSIS — R600 Localized edema: Secondary | ICD-10-CM

## 2020-04-12 DIAGNOSIS — R2689 Other abnormalities of gait and mobility: Secondary | ICD-10-CM

## 2020-04-12 DIAGNOSIS — M25661 Stiffness of right knee, not elsewhere classified: Secondary | ICD-10-CM

## 2020-04-12 MED ORDER — TRAMADOL HCL 50 MG PO TABS: 50.0000 mg | ORAL_TABLET | Freq: Two times a day (BID) | ORAL | 0 refills | Status: DC | PRN

## 2020-04-12 NOTE — Telephone Encounter (Signed)
Sent in tramadol-talked with pt in re to pain med use

## 2020-04-12 NOTE — Therapy (Addendum)
[place], [place], [address] Phone: [phone]   Fax:  [phone]  Physical Therapy Treatment / Discharge  Patient Details  Name: Julia [name] MRN: [id number] Date of Birth: July 11, 1966 Referring Provider (PT): Durward Fortes, MD   Encounter Date: 04/12/2020   PT End of Session - 04/12/20 1214    Visit Number 18    Number of Visits 22    Date for PT Re-Evaluation 05/09/20    Authorization Type [employer]    PT Start Time 1215    PT Stop Time 1255    PT Time Calculation (min) 40 min    Activity Tolerance Patient tolerated treatment well    Behavior During Therapy [facility] for tasks assessed/performed           Past Medical History:  Diagnosis Date  . Anxiety   . Arthritis    knees, hands  . Depression     Past Surgical History:  Procedure Laterality Date  . ABDOMINAL HYSTERECTOMY    . KNEE ARTHROSCOPY    . 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   Procedure: RIGHT TOTAL KNEE ARTHROPLASTY;  Surgeon: Garald Balding, MD;  Location: [facility];  Service: Orthopedics;  Laterality: Right;    There were no vitals filed for this visit.   Subjective Assessment - 04/12/20 1215    Subjective Patient reports she is a little sore and tight today from going on a long walk yesterday. Otherwise she is doing well.    Patient Stated Goals Get back to normal and surf fishing    Currently in Pain? Yes    Pain Score 1     Pain Location Knee    Pain Orientation Right    Pain Descriptors / Indicators Tightness;Sore    Pain Type Surgical pain    Pain Onset More than a month ago    Pain Frequency Intermittent              [facility] PT Assessment - 04/12/20 0001      AROM   Right Knee Flexion 113                         [facility] Adult PT Treatment/Exercise - 04/12/20 0001      Exercises   Exercises Knee/Hip      Knee/Hip Exercises: Stretches   Sports administrator 3 reps;30 seconds    Quad Stretch  Limitations prone PROM    Hip Flexor Stretch 3 reps;30 seconds    Hip Flexor Stretch Limitations supine edge of mat PROM    Gastroc Stretch 3 reps;30 seconds    Gastroc Stretch Limitations slant board      Knee/Hip Exercises: Aerobic   Recumbent Bike L1 x 4 min for knee motion    Nustep L5 x 4 min (LE only)      Knee/Hip Exercises: Machines for Strengthening   Cybex Leg Press 85# 3 x 8      Knee/Hip Exercises: Standing   Forward Lunges Limitations TXR split squat x 8 -  right knee forward    Functional Squat Limitations TRX squats x 8 - focus on full depth    Other Standing Knee Exercises Lateral band walk with blue band around knees 3 x 15      Knee/Hip Exercises: Prone   Hamstring Curl 2 sets;10 reps    Hamstring Curl Limitations 5#      Manual Therapy   Manual Therapy Joint mobilization;Passive ROM  Joint Mobilization Tibiofemoral mobs in supine and seated to improve knee flexion and extension    Passive ROM Knee flexion and extension to tolerance, stretching                  PT Education - 04/12/20 1214    Education Details HEP    Person(s) Educated Patient    Methods Explanation    Comprehension Verbalized understanding;Need further instruction               PT Long Term Goals - 03/29/20 5277      PT LONG TERM GOAL #1   Title Pt will be I and compliant with HEP.    Time 6    Period Weeks    Status On-going    Target Date 05/09/20      PT LONG TERM GOAL #2   Title Pt will improve Rt knee ROM 3-110 degrees    Time 12    Period Weeks    Status Achieved      PT LONG TERM GOAL #3   Title Pt will improve Rt knee strength to 5/5 MMT to improve function    Time 6    Period Weeks    Status On-going    Target Date 05/09/20      PT LONG TERM GOAL #4   Title Pt will be able to ambulate community distances >500 ft independently, no AD    Time 12    Period Weeks    Status Achieved                 Plan - 04/12/20 1214    Clinical  Impression Statement Patient tolerated therapy well with no adverse effects. She demonstrates similar knee flexion compared to previous visit but does not she is walking and performing more acitvity. Continued progression of strengthening with good tolerance. Incorporated TRX assisted squats to work on controlling movement through full range and knee stability. Patient would benefit from continued skilled PT to progress motion and strength in order to improve walking ability and return to prior level of function.    PT Treatment/Interventions Aquatic Therapy;Cryotherapy;Electrical Stimulation;Iontophoresis 43m/ml Dexamethasone;Moist Heat;Ultrasound;Gait training;Stair training;Functional mobility training;Therapeutic activities;Therapeutic exercise;Balance training;Neuromuscular re-education;Manual techniques;Passive range of motion;Dry needling;Scar mobilization;Joint Manipulations;Vasopneumatic Device;Taping    PT Next Visit Plan Assess HEP and progress PRN, continue manual and stretching for knee motion, progress strengthening and add to HEP as tolerated, vaso    PT Home Exercise Plan [id number]    Consulted and Agree with Plan of Care Patient           Patient will benefit from skilled therapeutic intervention in order to improve the following deficits and impairments:  Abnormal gait, Decreased activity tolerance, Decreased balance, Decreased endurance, Decreased mobility, Decreased range of motion, Decreased strength, Difficulty walking, Hypomobility, Increased edema, Increased fascial restricitons, Pain  Visit Diagnosis: Acute pain of right knee  Stiffness of right knee, not elsewhere classified  Muscle weakness (generalized)  Other abnormalities of gait and mobility  Localized edema     Problem List Patient Active Problem List   Diagnosis Date Noted  . Total knee replacement status, right 12/14/2019  . Class 2 obesity due to excess calories without serious comorbidity with body  mass index (BMI) of 39.0 to 39.9 in adult 11/05/2019  . Unilateral primary osteoarthritis, right knee 11/05/2019  . Unilateral primary osteoarthritis, left knee 11/05/2019  . Bilateral primary osteoarthritis of knee 01/25/2019    CHilda Blades PT, DPT, LAT, ATC 04/12/20  1:03 PM Phone: [phone] Fax: [facility] [address] [place], [place], [address] Phone: [phone]   Fax:  [phone]  Name: ASHERAH LAVOY MRN: [id number] Date of Birth: [birth year]    PHYSICAL THERAPY DISCHARGE SUMMARY  Visits from Start of Care: 18  Current functional level related to goals / functional outcomes: See above   Remaining deficits: See above   Education / Equipment: HEP  Plan: Patient agrees to discharge.  Patient goals were partially met. Patient is being discharged due to the patient's request.  ?????    Hilda Blades, PT, DPT, LAT, ATC 04/24/20  9:18 AM Phone: [phone] Fax: [phone]

## 2020-04-12 NOTE — Telephone Encounter (Signed)
See below

## 2020-04-12 NOTE — Telephone Encounter (Signed)
Patient notified

## 2020-04-13 ENCOUNTER — Ambulatory Visit: Payer: Managed Care, Other (non HMO) | Admitting: Physical Therapy

## 2020-04-17 ENCOUNTER — Ambulatory Visit: Payer: Managed Care, Other (non HMO) | Admitting: Physical Therapy

## 2020-04-24 ENCOUNTER — Encounter: Payer: Managed Care, Other (non HMO) | Admitting: Physical Therapy

## 2020-05-01 ENCOUNTER — Encounter: Payer: Managed Care, Other (non HMO) | Admitting: Physical Therapy

## 2020-05-03 ENCOUNTER — Other Ambulatory Visit: Payer: Self-pay

## 2020-05-03 MED ORDER — AMOXICILLIN 500 MG PO TABS: ORAL_TABLET | ORAL | 0 refills | Status: DC

## 2020-05-08 ENCOUNTER — Encounter: Payer: Managed Care, Other (non HMO) | Admitting: Physical Therapy

## 2020-05-26 ENCOUNTER — Encounter: Payer: Self-pay | Admitting: Orthopaedic Surgery

## 2020-05-26 ENCOUNTER — Ambulatory Visit (INDEPENDENT_AMBULATORY_CARE_PROVIDER_SITE_OTHER): Payer: Managed Care, Other (non HMO) | Admitting: Orthopaedic Surgery

## 2020-05-26 VITALS — Ht 68.0 in | Wt 258.2 lb

## 2020-05-26 DIAGNOSIS — M170 Bilateral primary osteoarthritis of knee: Secondary | ICD-10-CM

## 2020-05-26 NOTE — Progress Notes (Signed)
Office Visit Note   Patient: Julia [name]           Date of Birth: March 12, 1966           MRN: [id number] Visit Date: 05/26/2020              Requested by: Lavada Mesi, MD [address] [place],  [place] [address] PCP: Lavada Mesi, MD   Assessment & Plan: Visit Diagnoses:  1. Bilateral primary osteoarthritis of knee     Plan: 5 months status post primary right total knee replacement and very happy with the results. Working on exercises. Would like to proceed with a left knee replacement. Had prior films about 7 months ago demonstrating advanced osteoarthritis predominate in the medial compartment. We will try and schedule something in December. Should not need further clearance as she does not have any other health issues  Follow-Up Instructions: Return Schedule left total knee replacement.   Orders:  No orders of the defined types were placed in this encounter.  No orders of the defined types were placed in this encounter.     Procedures: No procedures performed   Clinical Data: No additional findings.   Subjective: Chief Complaint  Patient presents with  . Left Knee - Pain  . Right Knee - Pain  No complaints in reference to the right knee replacement performed in June 2021. Would like to proceed with left knee replacement  HPI  Review of Systems   Objective: Vital Signs: Ht 5\' 8"  (1.727 m)   Wt 258 lb 3.2 oz (117.1 kg)   BMI 39.26 kg/m   Physical Exam Constitutional:      Appearance: She is well-developed.  Eyes:     Pupils: Pupils are equal, round, and reactive to light.  Pulmonary:     Effort: Pulmonary effort is normal.  Skin:    General: Skin is warm and dry.  Neurological:     Mental Status: She is alert and oriented to person, place, and time.  Psychiatric:        Behavior: Behavior normal.     Ortho Exam awake alert and oriented x3. Comfortable sitting. No distress. Very happy with the results of her right knee. Appears to lacks just  a few degrees to full extension but clinically feels like it is fully extended flexed about 108 or 9 degrees. No instability. No effusion or increased warmth. Incision well-healed. Left knee with slight loss of full extension flexed to about 115 degrees without instability. No effusion. Mostly medial joint pain some patella crepitation Specialty Comments:  No specialty comments available.  Imaging: No results found.   PMFS History: Patient Active Problem List   Diagnosis Date Noted  . Total knee replacement status, right 12/14/2019  . Class 2 obesity due to excess calories without serious comorbidity with body mass index (BMI) of 39.0 to 39.9 in adult 11/05/2019  . Unilateral primary osteoarthritis, right knee 11/05/2019  . Unilateral primary osteoarthritis, left knee 11/05/2019  . Bilateral primary osteoarthritis of knee 01/25/2019   Past Medical History:  Diagnosis Date  . Anxiety   . Arthritis    knees, hands  . Depression     Family History  Problem Relation Age of Onset  . Cancer Mother     Past Surgical History:  Procedure Laterality Date  . ABDOMINAL HYSTERECTOMY    . KNEE ARTHROSCOPY    . 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   Procedure: RIGHT TOTAL KNEE ARTHROPLASTY;  Surgeon: 02/13/2020,  MD;  Location: [facility];  Service: Orthopedics;  Laterality: Right;   Social History   Occupational History  . Not on file  Tobacco Use  . Smoking status: Former Smoker    Packs/day: 1.00    Years: 15.00    Pack years: 15.00    Types: Cigarettes    Quit date: 12/02/2012    Years since quitting: 7.4  . Smokeless tobacco: Never Used  Vaping Use  . Vaping Use: Never used  Substance and Sexual Activity  . Alcohol use: Not Currently  . Drug use: Never  . Sexual activity: Yes     Valeria Batman, MD   Note - This record has been created using [employer].  Chart creation errors have been sought, but may not always  have been located. Such creation errors  do not reflect on  the standard of medical care.

## 2020-06-07 ENCOUNTER — Encounter: Payer: Self-pay | Admitting: Orthopaedic Surgery

## 2020-06-13 ENCOUNTER — Other Ambulatory Visit: Payer: Self-pay

## 2020-06-22 ENCOUNTER — Other Ambulatory Visit: Payer: Self-pay

## 2020-06-22 ENCOUNTER — Encounter: Payer: Self-pay | Admitting: Orthopaedic Surgery

## 2020-06-22 ENCOUNTER — Ambulatory Visit (INDEPENDENT_AMBULATORY_CARE_PROVIDER_SITE_OTHER): Payer: Managed Care, Other (non HMO) | Admitting: Orthopaedic Surgery

## 2020-06-22 VITALS — BP 124/82 | HR 89 | Temp 98.1°F

## 2020-06-22 DIAGNOSIS — M1712 Unilateral primary osteoarthritis, left knee: Secondary | ICD-10-CM

## 2020-06-22 NOTE — H&P (Signed)
ORTHOPAEDIC HISTORY & PHYSICAL  Julia [name] MRN:  [id number] DOB/SEX:  Aug 15, 1965/female  CHIEF COMPLAINT:  Painful left Knee  HISTORY: Patient is a 54 y.o. female presented with a history of pain in the left knee for 7 years. Onset of symptoms was gradual starting 10 year ago with rapidly worsening course since that time. Prior procedures on the knee are none. Patient has been treated conservatively with over-the-counter NSAIDs and activity modification. Patient currently rates pain in the knee at 8 out of 10 with activity. There is pain at night. present.  They have been previously treated with: NSAIDS: Ibuprofen, Naprosyn, Steriods, Tylenol with mild improvement  Knee injection with corticosteroid  was performed Knee injection with visco supplementation was not performed Medications: Motrin, Tylenol with mild improvement  PAST MEDICAL HISTORY: Patient Active Problem List   Diagnosis Date Noted  . Total knee replacement status, right 12/14/2019  . Class 2 obesity due to excess calories without serious comorbidity with body mass index (BMI) of 39.0 to 39.9 in adult 11/05/2019  . Unilateral primary osteoarthritis, right knee 11/05/2019  . Unilateral primary osteoarthritis, left knee 11/05/2019  . Bilateral primary osteoarthritis of knee 01/25/2019   Past Medical History:  Diagnosis Date  . Anxiety   . Arthritis    knees, hands  . Depression    Past Surgical History:  Procedure Laterality Date  . ABDOMINAL HYSTERECTOMY    . KNEE ARTHROSCOPY    . 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   Procedure: RIGHT TOTAL KNEE ARTHROPLASTY;  Surgeon: Valeria Batman, MD;  Location: [facility];  Service: Orthopedics;  Laterality: Right;     MEDICATIONS PRIOR TO ADMISSION:  Current Outpatient Medications:  .  acetaminophen (TYLENOL) 325 MG tablet, Take 2 tablets (650 mg total) by mouth every 6 (six) hours as needed., Disp: 100 tablet, Rfl: 2 .  amoxicillin (AMOXIL) 500 MG tablet, Take 4  tablets by mouth 1 hour prior to dental procedure., Disp: 12 tablet, Rfl: 0 .  aspirin EC 81 MG tablet, Take 1 tablet (81 mg total) by mouth 2 (two) times daily., Disp: 150 tablet, Rfl: 2 .  clonazePAM (KLONOPIN) 1 MG tablet, Take 1 mg by mouth 2 (two) times daily as needed for anxiety. , Disp: , Rfl:  .  gabapentin (NEURONTIN) 100 MG capsule, Take 1 capsule (100 mg total) by mouth 3 (three) times daily. (Patient not taking: Reported on 03/09/2020), Disp: 40 capsule, Rfl: 0 .  HYDROcodone-acetaminophen (NORCO/VICODIN) 5-325 MG tablet, Take 1 tablet by mouth every 6 (six) hours as needed for moderate pain. (Patient not taking: Reported on 03/09/2020), Disp: 30 tablet, Rfl: 0 .  HYDROcodone-acetaminophen (NORCO/VICODIN) 5-325 MG tablet, Take 1 tablet by mouth every 6 (six) hours as needed for moderate pain. (Patient not taking: Reported on 03/09/2020), Disp: 30 tablet, Rfl: 0 .  HYDROcodone-acetaminophen (NORCO/VICODIN) 5-325 MG tablet, Take 1 tablet by mouth every 8 (eight) hours as needed for moderate pain. (Patient not taking: Reported on 03/09/2020), Disp: 30 tablet, Rfl: 0 .  HYDROcodone-acetaminophen (NORCO/VICODIN) 5-325 MG tablet, Take 1 tablet by mouth every 6 (six) hours as needed for moderate pain., Disp: 30 tablet, Rfl: 0 .  HYDROcodone-acetaminophen (NORCO/VICODIN) 5-325 MG tablet, Take 1 tablet by mouth every 6 (six) hours as needed for moderate pain., Disp: 30 tablet, Rfl: 0 .  lisinopril-hydrochlorothiazide (ZESTORETIC) 10-12.5 MG tablet, Start 1/2 tab PO qd, may increase to 1 PO qd if needed for BP control., Disp: 30 tablet, Rfl: 6 .  Multiple Vitamins-Minerals (MULTIVITAMIN  WITH MINERALS) tablet, Take 1 tablet by mouth daily. Chewable, Disp: , Rfl:  .  oxycodone (OXY-IR) 5 MG capsule, Take 1-2 capsules (5-10 mg total) by mouth every 4 (four) hours as needed for pain (STOP DILAUDID). (Patient not taking: Reported on 03/09/2020), Disp: 30 capsule, Rfl: 0 .  oxyCODONE (OXYCONTIN) 10 mg 12 hr  tablet, Take 1 tablet (10 mg total) by mouth every 12 (twelve) hours. (Patient not taking: Reported on 03/09/2020), Disp: 10 tablet, Rfl: 0 .  oxyCODONE (ROXICODONE) 5 MG immediate release tablet, Take 1 tablet (5 mg total) by mouth every 8 (eight) hours as needed. (Patient not taking: Reported on 03/09/2020), Disp: 20 tablet, Rfl: 0 .  traMADol (ULTRAM) 50 MG tablet, Take 1 tablet (50 mg total) by mouth every 12 (twelve) hours as needed., Disp: 30 tablet, Rfl: 0 .  Vilazodone HCl (VIIBRYD) 20 MG TABS, Take 20 mg by mouth daily. , Disp: , Rfl:  .  vitamin B-12 (CYANOCOBALAMIN) 1000 MCG tablet, Take 1,000 mcg by mouth daily. Chewable, Disp: , Rfl:  .  zolpidem (AMBIEN) 10 MG tablet, Take 10 mg by mouth at bedtime as needed for sleep. , Disp: , Rfl:    ALLERGIES:  No Known Allergies  REVIEW OF SYSTEMS:  Review of Systems  Constitutional: Negative.   HENT: Negative.   Eyes: Negative.   Respiratory: Negative.   Cardiovascular: Positive for leg swelling.  Gastrointestinal: Negative.   Genitourinary: Negative.   Musculoskeletal: Positive for joint pain. Negative for myalgias.  Skin: Negative.   Neurological: Negative.   Endo/Heme/Allergies: Positive for environmental allergies.  Psychiatric/Behavioral: Negative.     FAMILY HISTORY:   Family History  Problem Relation Age of Onset  . Cancer Mother     SOCIAL HISTORY:   Social History   Occupational History  . Not on file  Tobacco Use  . Smoking status: Former Smoker    Packs/day: 1.00    Years: 15.00    Pack years: 15.00    Types: Cigarettes    Quit date: 12/02/2012    Years since quitting: 7.5  . Smokeless tobacco: Never Used  Vaping Use  . Vaping Use: Never used  Substance and Sexual Activity  . Alcohol use: Not Currently  . Drug use: Never  . Sexual activity: Yes     EXAMINATION:  Vital signs in last 24 hours: BP  124/82 Pulse 89 Temp 98.1   Physical Exam Vitals reviewed.  Constitutional:      Appearance:  Normal appearance.  HENT:     Head: Normocephalic.     Nose: Nose normal.     Mouth/Throat:     Mouth: Mucous membranes are moist.  Eyes:     Extraocular Movements: Extraocular movements intact.     Conjunctiva/sclera: Conjunctivae normal.     Pupils: Pupils are equal, round, and reactive to light.  Cardiovascular:     Rate and Rhythm: Normal rate and regular rhythm.     Pulses: Normal pulses.     Heart sounds: Normal heart sounds. No murmur heard.   Pulmonary:     Effort: Pulmonary effort is normal.     Breath sounds: Normal breath sounds.  Abdominal:     General: Bowel sounds are normal.     Palpations: Abdomen is soft.  Musculoskeletal:        General: Tenderness present.     Cervical back: Neck supple.     Left knee: Effusion present.  Skin:    General: Skin is warm and  dry.     Capillary Refill: Capillary refill takes less than 2 seconds.  Neurological:     General: No focal deficit present.     Mental Status: She is alert and oriented to person, place, and time.  Psychiatric:        Mood and Affect: Mood normal.        Behavior: Behavior normal.        Thought Content: Thought content normal.        Judgment: Judgment normal.    Right Knee Exam   Range of Motion  Extension: 0  Flexion: 0    Left Knee Exam   Tenderness  The patient is experiencing tenderness in the medial joint line.  Range of Motion  Extension: 0  Flexion: 100   Other  Swelling: none Effusion: effusion present      Imaging Review Plain radiographs demonstrate moderate degenerative joint disease of the left knee. The overall alignment is mild varus. The bone quality appears to be good for age and reported activity level.  ASSESSMENT: End stage arthritis, left knee  Past Medical History:  Diagnosis Date  . Anxiety   . Arthritis    knees, hands  . Depression     PLAN: Plan for left total knee replacement.  The patient history, physical examination and imaging studies  are consistent with moderate degenerative joint disease of the left knee. The patient has failed conservative treatment.  The clearance notes were reviewed.  After discussion with the patient it was felt that Total Knee Replacement was indicated. The procedure,  risks, and benefits of total knee arthroplasty were presented and reviewed. The risks including but not limited to aseptic loosening, infection, blood clots, vascular and nerve injury, stiffness, patella tracking problems and fracture complications among others were discussed. The patient acknowledged the explanation, agreed to proceed with total knee replacement.   Patient's anticipated LOS is less than 2 midnights, meeting these requirements: - Younger than 60 - Lives within 1 hour of care - Has a competent adult at home to recover with post-op recover - NO history of  - Chronic pain requiring opiods  - Diabetes  - Coronary Artery Disease  - Heart failure  - Heart attack  - Stroke  - DVT/VTE  - Cardiac arrhythmia  - Respiratory Failure/COPD  - Renal failure  - Anemia  - Advanced Liver disease        Oris Drone. Daphine Deutscher [phone]  06/22/2020 1:06 PM

## 2020-06-22 NOTE — Progress Notes (Signed)
[name], MD   [name], PA-C [address] [place], [place]  [address]                             [phone]   ORTHOPAEDIC HISTORY & PHYSICAL  Julia [name] MRN:  [id number] DOB/SEX:  Aug 10, 1965/female  CHIEF COMPLAINT:  Painful left Knee  HISTORY: Patient is a 55 y.o. female presented with a history of pain in the left knee for 7 years. Onset of symptoms was gradual starting 10 year ago with rapidly worsening course since that time. Prior procedures on the knee are none. Patient has been treated conservatively with over-the-counter NSAIDs and activity modification. Patient currently rates pain in the knee at 8 out of 10 with activity. There is pain at night. present.  They have been previously treated with: NSAIDS: Ibuprofen, Naprosyn, Steriods, Tylenol with mild improvement  Knee injection with corticosteroid  was performed Knee injection with visco supplementation was not performed Medications: Motrin, Tylenol with mild improvement  PAST MEDICAL HISTORY: Patient Active Problem List   Diagnosis Date Noted  . Total knee replacement status, right 12/14/2019  . Class 2 obesity due to excess calories without serious comorbidity with body mass index (BMI) of 39.0 to 39.9 in adult 11/05/2019  . Unilateral primary osteoarthritis, right knee 11/05/2019  . Unilateral primary osteoarthritis, left knee 11/05/2019  . Bilateral primary osteoarthritis of knee 01/25/2019   Past Medical History:  Diagnosis Date  . Anxiety   . Arthritis    knees, hands  . Depression    Past Surgical History:  Procedure Laterality Date  . ABDOMINAL HYSTERECTOMY    . KNEE ARTHROSCOPY    . 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   Procedure: RIGHT TOTAL KNEE ARTHROPLASTY;  Surgeon: Valeria Batman, MD;  Location: [facility];  Service: Orthopedics;  Laterality: Right;     MEDICATIONS PRIOR TO ADMISSION:  Current Outpatient Medications:  .  acetaminophen (TYLENOL) 325 MG tablet,  Take 2 tablets (650 mg total) by mouth every 6 (six) hours as needed., Disp: 100 tablet, Rfl: 2 .  amoxicillin (AMOXIL) 500 MG tablet, Take 4 tablets by mouth 1 hour prior to dental procedure., Disp: 12 tablet, Rfl: 0 .  aspirin EC 81 MG tablet, Take 1 tablet (81 mg total) by mouth 2 (two) times daily., Disp: 150 tablet, Rfl: 2 .  clonazePAM (KLONOPIN) 1 MG tablet, Take 1 mg by mouth 2 (two) times daily as needed for anxiety. , Disp: , Rfl:  .  gabapentin (NEURONTIN) 100 MG capsule, Take 1 capsule (100 mg total) by mouth 3 (three) times daily. (Patient not taking: Reported on 03/09/2020), Disp: 40 capsule, Rfl: 0 .  HYDROcodone-acetaminophen (NORCO/VICODIN) 5-325 MG tablet, Take 1 tablet by mouth every 6 (six) hours as needed for moderate pain. (Patient not taking: Reported on 03/09/2020), Disp: 30 tablet, Rfl: 0 .  HYDROcodone-acetaminophen (NORCO/VICODIN) 5-325 MG tablet, Take 1 tablet by mouth every 6 (six) hours as needed for moderate pain. (Patient not taking: Reported on 03/09/2020), Disp: 30 tablet, Rfl: 0 .  HYDROcodone-acetaminophen (NORCO/VICODIN) 5-325 MG tablet, Take 1 tablet by mouth every 8 (eight) hours as needed for moderate pain. (Patient not taking: Reported on 03/09/2020), Disp: 30 tablet, Rfl: 0 .  HYDROcodone-acetaminophen (NORCO/VICODIN) 5-325 MG tablet, Take 1 tablet by mouth every 6 (six) hours as needed for moderate pain., Disp: 30 tablet, Rfl: 0 .  HYDROcodone-acetaminophen (NORCO/VICODIN) 5-325 MG tablet, Take 1 tablet  by mouth every 6 (six) hours as needed for moderate pain., Disp: 30 tablet, Rfl: 0 .  lisinopril-hydrochlorothiazide (ZESTORETIC) 10-12.5 MG tablet, Start 1/2 tab PO qd, may increase to 1 PO qd if needed for BP control., Disp: 30 tablet, Rfl: 6 .  Multiple Vitamins-Minerals (MULTIVITAMIN WITH MINERALS) tablet, Take 1 tablet by mouth daily. Chewable, Disp: , Rfl:  .  oxycodone (OXY-IR) 5 MG capsule, Take 1-2 capsules (5-10 mg total) by mouth every 4 (four) hours as  needed for pain (STOP DILAUDID). (Patient not taking: Reported on 03/09/2020), Disp: 30 capsule, Rfl: 0 .  oxyCODONE (OXYCONTIN) 10 mg 12 hr tablet, Take 1 tablet (10 mg total) by mouth every 12 (twelve) hours. (Patient not taking: Reported on 03/09/2020), Disp: 10 tablet, Rfl: 0 .  oxyCODONE (ROXICODONE) 5 MG immediate release tablet, Take 1 tablet (5 mg total) by mouth every 8 (eight) hours as needed. (Patient not taking: Reported on 03/09/2020), Disp: 20 tablet, Rfl: 0 .  traMADol (ULTRAM) 50 MG tablet, Take 1 tablet (50 mg total) by mouth every 12 (twelve) hours as needed., Disp: 30 tablet, Rfl: 0 .  Vilazodone HCl (VIIBRYD) 20 MG TABS, Take 20 mg by mouth daily. , Disp: , Rfl:  .  vitamin B-12 (CYANOCOBALAMIN) 1000 MCG tablet, Take 1,000 mcg by mouth daily. Chewable, Disp: , Rfl:  .  zolpidem (AMBIEN) 10 MG tablet, Take 10 mg by mouth at bedtime as needed for sleep. , Disp: , Rfl:    ALLERGIES:  No Known Allergies  REVIEW OF SYSTEMS:  Review of Systems  Constitutional: Negative.   HENT: Negative.   Eyes: Negative.   Respiratory: Negative.   Cardiovascular: Positive for leg swelling.  Gastrointestinal: Negative.   Genitourinary: Negative.   Musculoskeletal: Positive for joint pain. Negative for myalgias.  Skin: Negative.   Neurological: Negative.   Endo/Heme/Allergies: Positive for environmental allergies.  Psychiatric/Behavioral: Negative.     FAMILY HISTORY:   Family History  Problem Relation Age of Onset  . Cancer Mother     SOCIAL HISTORY:   Social History   Occupational History  . Not on file  Tobacco Use  . Smoking status: Former Smoker    Packs/day: 1.00    Years: 15.00    Pack years: 15.00    Types: Cigarettes    Quit date: 12/02/2012    Years since quitting: 7.5  . Smokeless tobacco: Never Used  Vaping Use  . Vaping Use: Never used  Substance and Sexual Activity  . Alcohol use: Not Currently  . Drug use: Never  . Sexual activity: Yes      EXAMINATION:  Vital signs in last 24 hours: BP  124/82 Pulse 89 Temp 98.1   Physical Exam Vitals reviewed.  Constitutional:      Appearance: Normal appearance.  HENT:     Head: Normocephalic.     Nose: Nose normal.     Mouth/Throat:     Mouth: Mucous membranes are moist.  Eyes:     Extraocular Movements: Extraocular movements intact.     Conjunctiva/sclera: Conjunctivae normal.     Pupils: Pupils are equal, round, and reactive to light.  Cardiovascular:     Rate and Rhythm: Normal rate and regular rhythm.     Pulses: Normal pulses.     Heart sounds: Normal heart sounds. No murmur heard.   Pulmonary:     Effort: Pulmonary effort is normal.     Breath sounds: Normal breath sounds.  Abdominal:     General: Bowel  sounds are normal.     Palpations: Abdomen is soft.  Musculoskeletal:        General: Tenderness present.     Cervical back: Neck supple.     Left knee: Effusion present.  Skin:    General: Skin is warm and dry.     Capillary Refill: Capillary refill takes less than 2 seconds.  Neurological:     General: No focal deficit present.     Mental Status: She is alert and oriented to person, place, and time.  Psychiatric:        Mood and Affect: Mood normal.        Behavior: Behavior normal.        Thought Content: Thought content normal.        Judgment: Judgment normal.    Right Knee Exam   Range of Motion  Extension: 0  Flexion: 0    Left Knee Exam   Tenderness  The patient is experiencing tenderness in the medial joint line.  Range of Motion  Extension: 0  Flexion: 100   Other  Swelling: none Effusion: effusion present      Imaging Review Plain radiographs demonstrate moderate degenerative joint disease of the left knee. The overall alignment is mild varus. The bone quality appears to be good for age and reported activity level.  ASSESSMENT: End stage arthritis, left knee  Past Medical History:  Diagnosis Date  . Anxiety   .  Arthritis    knees, hands  . Depression     PLAN: Plan for left total knee replacement.  The patient history, physical examination and imaging studies are consistent with moderate degenerative joint disease of the left knee. The patient has failed conservative treatment.  The clearance notes were reviewed.  After discussion with the patient it was felt that Total Knee Replacement was indicated. The procedure,  risks, and benefits of total knee arthroplasty were presented and reviewed. The risks including but not limited to aseptic loosening, infection, blood clots, vascular and nerve injury, stiffness, patella tracking problems and fracture complications among others were discussed. The patient acknowledged the explanation, agreed to proceed with total knee replacement.   Patient's anticipated LOS is less than 2 midnights, meeting these requirements: - Younger than 75 - Lives within 1 hour of care - Has a competent adult at home to recover with post-op recover - NO history of  - Chronic pain requiring opiods  - Diabetes  - Coronary Artery Disease  - Heart failure  - Heart attack  - Stroke  - DVT/VTE  - Cardiac arrhythmia  - Respiratory Failure/COPD  - Renal failure  - Anemia  - Advanced Liver disease        Oris Drone. Daphine Deutscher [phone]  06/22/2020 1:06 PM

## 2020-06-28 NOTE — Patient Instructions (Addendum)
DUE TO COVID-19 ONLY ONE VISITOR IS ALLOWED TO COME WITH YOU AND STAY IN THE WAITING ROOM ONLY DURING PRE OP AND PROCEDURE DAY OF SURGERY. THE 1 VISITOR  MAY VISIT WITH YOU AFTER SURGERY IN YOUR PRIVATE ROOM DURING VISITING HOURS ONLY!  YOU NEED TO HAVE A COVID 19 TEST ON: 06/30/20 @ 8:00 AM , THIS TEST MUST BE DONE BEFORE SURGERY,  COVID TESTING SITE [address] [place] [id number], IT IS ON THE RIGHT GOING OUT [address] APPROXIMATELY  2 MINUTES PAST [employer] ON THE RIGHT. ONCE YOUR COVID TEST IS COMPLETED,  PLEASE BEGIN THE QUARANTINE INSTRUCTIONS AS OUTLINED IN YOUR HANDOUT.                Julia AlvineAnn M [name]    Your procedure is scheduled on: 07/04/20   [facility]   Report to short stay at: 5:30 AM     Call this number if you have problems the morning of surgery [phone]    Remember:   NO SOLID FOOD AFTER MIDNIGHT THE NIGHT PRIOR TO SURGERY. NOTHING BY MOUTH EXCEPT CLEAR LIQUIDS UNTIL: 4:15 AM . PLEASE FINISH ENSURE DRINK PER SURGEON ORDER  WHICH NEEDS TO BE COMPLETED AT : 4:15 AM.  CLEAR LIQUID DIET   Foods Allowed                                                                     Foods Excluded  Coffee and tea, regular and decaf                             liquids that you cannot  Plain Jell-O any favor except red or purple                                           see through such as: Fruit ices (not with fruit pulp)                                     milk, soups, orange juice  Iced Popsicles                                    All solid food Carbonated beverages, regular and diet                                    Cranberry, grape and apple juices Sports drinks like Gatorade Lightly seasoned clear broth or consume(fat free) Sugar, honey syrup  Sample Menu Breakfast                                Lunch  Supper Cranberry juice                    Beef broth                             Chicken broth Jell-O                                     Grape juice                           Apple juice Coffee or tea                        Jell-O                                      Popsicle                                                Coffee or tea                        Coffee or tea  _____________________________________________________________________   BRUSH YOUR TEETH MORNING OF SURGERY AND RINSE YOUR MOUTH OUT, NO CHEWING GUM CANDY OR MINTS.    Take these medicines the morning of surgery with A SIP OF WATER: viibryd.                               You may not have any metal on your body including hair pins and              piercings  Do not wear jewelry, make-up, lotions, powders or perfumes, deodorant             Do not wear nail polish on your fingernails.  Do not shave  48 hours prior to surgery.              Do not bring valuables to the hospital. [facility] IS NOT             RESPONSIBLE   FOR VALUABLES.  Contacts, dentures or bridgework may not be worn into surgery.  Leave suitcase in the car. After surgery it may be brought to your room.     Patients discharged the day of surgery will not be allowed to drive home. IF YOU ARE HAVING SURGERY AND GOING HOME THE SAME DAY, YOU MUST HAVE AN ADULT TO DRIVE YOU HOME AND BE WITH YOU FOR 24 HOURS. YOU MAY GO HOME BY TAXI OR UBER OR ORTHERWISE, BUT AN ADULT MUST ACCOMPANY YOU HOME AND STAY WITH YOU FOR 24 HOURS.  Name and phone number of your driver:  Special Instructions: N/A              Please read over the following fact sheets you were given: _____________________________________________________________________          [facility] - Preparing for Surgery Before surgery, you can play an important role.  Because skin is not sterile, your skin needs  to be as free of germs as possible.  You can reduce the number of germs on your skin by washing with CHG (chlorahexidine gluconate) soap before surgery.  CHG is an  antiseptic cleaner which kills germs and bonds with the skin to continue killing germs even after washing. Please DO NOT use if you have an allergy to CHG or antibacterial soaps.  If your skin becomes reddened/irritated stop using the CHG and inform your nurse when you arrive at Short Stay. Do not shave (including legs and underarms) for at least 48 hours prior to the first CHG shower.  You may shave your face/neck. Please follow these instructions carefully:  1.  Shower with CHG Soap the night before surgery and the  morning of Surgery.  2.  If you choose to wash your hair, wash your hair first as usual with your  normal  shampoo.  3.  After you shampoo, rinse your hair and body thoroughly to remove the  shampoo.                           4.  Use CHG as you would any other liquid soap.  You can apply chg directly  to the skin and wash                       Gently with a scrungie or clean washcloth.  5.  Apply the CHG Soap to your body ONLY FROM THE NECK DOWN.   Do not use on face/ open                           Wound or open sores. Avoid contact with eyes, ears mouth and genitals (private parts).                       Wash face,  Genitals (private parts) with your normal soap.             6.  Wash thoroughly, paying special attention to the area where your surgery  will be performed.  7.  Thoroughly rinse your body with warm water from the neck down.  8.  DO NOT shower/wash with your normal soap after using and rinsing off  the CHG Soap.                9.  Pat yourself dry with a clean towel.            10.  Wear clean pajamas.            11.  Place clean sheets on your bed the night of your first shower and do not  sleep with pets. Day of Surgery : Do not apply any lotions/deodorants the morning of surgery.  Please wear clean clothes to the hospital/surgery center.  FAILURE TO FOLLOW THESE INSTRUCTIONS MAY RESULT IN THE CANCELLATION OF YOUR SURGERY PATIENT  SIGNATURE_________________________________  NURSE SIGNATURE__________________________________  ________________________________________________________________________   Julia [name]  An incentive spirometer is a tool that can help keep your lungs clear and active. This tool measures how well you are filling your lungs with each breath. Taking long deep breaths may help reverse or decrease the chance of developing breathing (pulmonary) problems (especially infection) following:  A long period of time when you are unable to move or be active. BEFORE THE PROCEDURE   If the spirometer includes an indicator to show  your best effort, your nurse or respiratory therapist will set it to a desired goal.  If possible, sit up straight or lean slightly forward. Try not to slouch.  Hold the incentive spirometer in an upright position. INSTRUCTIONS FOR USE  1. Sit on the edge of your bed if possible, or sit up as far as you can in bed or on a chair. 2. Hold the incentive spirometer in an upright position. 3. Breathe out normally. 4. Place the mouthpiece in your mouth and seal your lips tightly around it. 5. Breathe in slowly and as deeply as possible, raising the piston or the ball toward the top of the column. 6. Hold your breath for 3-5 seconds or for as long as possible. Allow the piston or ball to fall to the bottom of the column. 7. Remove the mouthpiece from your mouth and breathe out normally. 8. Rest for a few seconds and repeat Steps 1 through 7 at least 10 times every 1-2 hours when you are awake. Take your time and take a few normal breaths between deep breaths. 9. The spirometer may include an indicator to show your best effort. Use the indicator as a goal to work toward during each repetition. 10. After each set of 10 deep breaths, practice coughing to be sure your lungs are clear. If you have an incision (the cut made at the time of surgery), support your incision when coughing  by placing a pillow or rolled up towels firmly against it. Once you are able to get out of bed, walk around indoors and cough well. You may stop using the incentive spirometer when instructed by your caregiver.  RISKS AND COMPLICATIONS  Take your time so you do not get dizzy or light-headed.  If you are in pain, you may need to take or ask for pain medication before doing incentive spirometry. It is harder to take a deep breath if you are having pain. AFTER USE  Rest and breathe slowly and easily.  It can be helpful to keep track of a log of your progress. Your caregiver can provide you with a simple table to help with this. If you are using the spirometer at home, follow these instructions: SEEK MEDICAL CARE IF:   You are having difficultly using the spirometer.  You have trouble using the spirometer as often as instructed.  Your pain medication is not giving enough relief while using the spirometer.  You develop fever of 100.5 F (38.1 C) or higher. SEEK IMMEDIATE MEDICAL CARE IF:   You cough up bloody sputum that had not been present before.  You develop fever of 102 F (38.9 C) or greater.  You develop worsening pain at or near the incision site. MAKE SURE YOU:   Understand these instructions.  Will watch your condition.  Will get help right away if you are not doing well or get worse. Document Released: 11/11/2006 Document Revised: 09/23/2011 Document Reviewed: 01/12/2007 [facility] Patient Information 2014 [place], [place].   ________________________________________________________________________

## 2020-06-29 ENCOUNTER — Encounter (HOSPITAL_COMMUNITY)
Admission: RE | Admit: 2020-06-29 | Discharge: 2020-06-29 | Disposition: A | Discharge status: Home / Self Care | Payer: Managed Care, Other (non HMO) | Source: Ambulatory Visit | Attending: Orthopaedic Surgery | Admitting: Orthopaedic Surgery

## 2020-06-29 ENCOUNTER — Encounter: Payer: Self-pay | Admitting: Physical Therapy

## 2020-06-29 ENCOUNTER — Encounter (HOSPITAL_COMMUNITY): Payer: Self-pay

## 2020-06-29 ENCOUNTER — Other Ambulatory Visit: Payer: Self-pay

## 2020-06-29 DIAGNOSIS — Z01818 Encounter for other preprocedural examination: Secondary | ICD-10-CM

## 2020-06-29 HISTORY — DX: Essential (primary) hypertension: I10

## 2020-06-29 LAB — CBC WITH DIFFERENTIAL/PLATELET
Abs Immature Granulocytes: 0.01 10*3/uL (ref 0.00–0.07)
Basophils Absolute: 0.1 10*3/uL (ref 0.0–0.1)
Basophils Relative: 1 %
Eosinophils Absolute: 0.2 10*3/uL (ref 0.0–0.5)
Eosinophils Relative: 3 %
HCT: 39.8 % (ref 36.0–46.0)
Hemoglobin: 13 g/dL (ref 12.0–15.0)
Immature Granulocytes: 0 %
Lymphocytes Relative: 26 %
Lymphs Abs: 1.6 10*3/uL (ref 0.7–4.0)
MCH: 31.5 pg (ref 26.0–34.0)
MCHC: 32.7 g/dL (ref 30.0–36.0)
MCV: 96.4 fL (ref 80.0–100.0)
Monocytes Absolute: 0.3 10*3/uL (ref 0.1–1.0)
Monocytes Relative: 5 %
Neutro Abs: 4.1 10*3/uL (ref 1.7–7.7)
Neutrophils Relative %: 65 %
Platelets: 345 10*3/uL (ref 150–400)
RBC: 4.13 MIL/uL (ref 3.87–5.11)
RDW: 14 % (ref 11.5–15.5)
WBC: 6.2 10*3/uL (ref 4.0–10.5)
nRBC: 0 % (ref 0.0–0.2)

## 2020-06-29 LAB — URINALYSIS, ROUTINE W REFLEX MICROSCOPIC
Bilirubin Urine: NEGATIVE
Glucose, UA: NEGATIVE mg/dL
Ketones, ur: NEGATIVE mg/dL
Nitrite: NEGATIVE
Protein, ur: NEGATIVE mg/dL
Specific Gravity, Urine: 1.019 (ref 1.005–1.030)
pH: 5 (ref 5.0–8.0)

## 2020-06-29 LAB — COMPREHENSIVE METABOLIC PANEL
ALT: 45 U/L — ABNORMAL HIGH (ref 0–44)
AST: 40 U/L (ref 15–41)
Albumin: 4.5 g/dL (ref 3.5–5.0)
Alkaline Phosphatase: 55 U/L (ref 38–126)
Anion gap: 13 (ref 5–15)
BUN: 17 mg/dL (ref 6–20)
CO2: 27 mmol/L (ref 22–32)
Calcium: 9.5 mg/dL (ref 8.9–10.3)
Chloride: 99 mmol/L (ref 98–111)
Creatinine, Ser: 0.82 mg/dL (ref 0.44–1.00)
GFR, Estimated: >60 mL/min (ref >60)
Glucose, Bld: 103 mg/dL — ABNORMAL HIGH (ref 70–99)
Potassium: 4 mmol/L (ref 3.5–5.1)
Sodium: 139 mmol/L (ref 135–145)
Total Bilirubin: 0.4 mg/dL (ref 0.3–1.2)
Total Protein: 8.1 g/dL (ref 6.5–8.1)

## 2020-06-29 LAB — PROTIME-INR
INR: 1 (ref 0.8–1.2)
Prothrombin Time: 12.6 seconds (ref 11.4–15.2)

## 2020-06-29 LAB — SURGICAL PCR SCREEN
MRSA, PCR: NEGATIVE
Staphylococcus aureus: NEGATIVE

## 2020-06-29 LAB — APTT: aPTT: 29 seconds (ref 24–36)

## 2020-06-30 ENCOUNTER — Other Ambulatory Visit (HOSPITAL_COMMUNITY)
Admission: RE | Admit: 2020-06-30 | Discharge: 2020-06-30 | Disposition: A | Discharge status: Home / Self Care | Payer: Managed Care, Other (non HMO) | Source: Ambulatory Visit | Attending: Orthopaedic Surgery | Admitting: Orthopaedic Surgery

## 2020-06-30 DIAGNOSIS — Z01812 Encounter for preprocedural laboratory examination: Secondary | ICD-10-CM | POA: Diagnosis not present

## 2020-06-30 DIAGNOSIS — Z20822 Contact with and (suspected) exposure to covid-19: Secondary | ICD-10-CM | POA: Insufficient documentation

## 2020-06-30 LAB — URINE CULTURE

## 2020-06-30 LAB — SARS CORONAVIRUS 2 (TAT 6-24 HRS): SARS Coronavirus 2: NEGATIVE

## 2020-07-03 NOTE — Anesthesia Preprocedure Evaluation (Addendum)
Anesthesia Evaluation  Patient identified by MRN, date of birth, ID band Patient awake    Reviewed: Allergy & Precautions, H&P , NPO status , Patient's Chart, lab work & pertinent test results  Airway Mallampati: III  TM Distance: >3 FB Neck ROM: Full    Dental no notable dental hx. (+) Teeth Intact, Dental Advisory Given   Pulmonary neg pulmonary ROS, former smoker,    Pulmonary exam normal breath sounds clear to auscultation       Cardiovascular hypertension, Pt. on medications Normal cardiovascular exam Rhythm:Regular Rate:Normal     Neuro/Psych PSYCHIATRIC DISORDERS Anxiety Depression negative neurological ROS     GI/Hepatic negative GI ROS, Neg liver ROS,   Endo/Other  Morbid obesity  Renal/GU negative Renal ROS  negative genitourinary   Musculoskeletal  (+) Arthritis , Osteoarthritis,    Abdominal   Peds negative pediatric ROS (+)  Hematology negative hematology ROS (+)   Anesthesia Other Findings   Reproductive/Obstetrics negative OB ROS                            Anesthesia Physical  Anesthesia Plan  ASA: III  Anesthesia Plan: Spinal   Post-op Pain Management:  Regional for Post-op pain   Induction: Intravenous  PONV Risk Score and Plan: 2 and Ondansetron, Treatment may vary due to age or medical condition, Propofol infusion and Midazolam  Airway Management Planned: Nasal Cannula and Natural Airway  Additional Equipment:   Intra-op Plan:   Post-operative Plan:   Informed Consent: I have reviewed the patients History and Physical, chart, labs and discussed the procedure including the risks, benefits and alternatives for the proposed anesthesia with the patient or authorized representative who has indicated his/her understanding and acceptance.       Plan Discussed with: CRNA, Surgeon and Anesthesiologist  Anesthesia Plan Comments:        Anesthesia Quick  Evaluation

## 2020-07-04 ENCOUNTER — Ambulatory Visit (HOSPITAL_COMMUNITY): Payer: Managed Care, Other (non HMO) | Admitting: Anesthesiology

## 2020-07-04 ENCOUNTER — Ambulatory Visit (HOSPITAL_COMMUNITY)
Admission: RE | Admit: 2020-07-04 | Discharge: 2020-07-04 | Disposition: A | Discharge status: Home / Self Care | Payer: Managed Care, Other (non HMO) | Attending: Orthopaedic Surgery | Admitting: Orthopaedic Surgery

## 2020-07-04 ENCOUNTER — Encounter (HOSPITAL_COMMUNITY)
Admission: RE | Disposition: A | Discharge status: Home / Self Care | Payer: Self-pay | Source: Home / Self Care | Attending: Orthopaedic Surgery

## 2020-07-04 ENCOUNTER — Other Ambulatory Visit: Payer: Self-pay | Admitting: Orthopedic Surgery

## 2020-07-04 ENCOUNTER — Other Ambulatory Visit: Payer: Self-pay

## 2020-07-04 ENCOUNTER — Encounter (HOSPITAL_COMMUNITY): Payer: Self-pay | Admitting: Orthopaedic Surgery

## 2020-07-04 DIAGNOSIS — M170 Bilateral primary osteoarthritis of knee: Secondary | ICD-10-CM

## 2020-07-04 DIAGNOSIS — Z87891 Personal history of nicotine dependence: Secondary | ICD-10-CM | POA: Diagnosis not present

## 2020-07-04 DIAGNOSIS — Z96652 Presence of left artificial knee joint: Secondary | ICD-10-CM

## 2020-07-04 DIAGNOSIS — Z6837 Body mass index (BMI) 37.0-37.9, adult: Secondary | ICD-10-CM | POA: Diagnosis not present

## 2020-07-04 DIAGNOSIS — Z79899 Other long term (current) drug therapy: Secondary | ICD-10-CM | POA: Diagnosis not present

## 2020-07-04 DIAGNOSIS — Z96651 Presence of right artificial knee joint: Secondary | ICD-10-CM | POA: Diagnosis not present

## 2020-07-04 DIAGNOSIS — M1712 Unilateral primary osteoarthritis, left knee: Secondary | ICD-10-CM | POA: Insufficient documentation

## 2020-07-04 DIAGNOSIS — Z7982 Long term (current) use of aspirin: Secondary | ICD-10-CM | POA: Insufficient documentation

## 2020-07-04 HISTORY — PX: TOTAL KNEE ARTHROPLASTY: SHX125

## 2020-07-04 LAB — TYPE AND SCREEN
ABO/RH(D): O NEG
Antibody Screen: NEGATIVE

## 2020-07-04 SURGERY — ARTHROPLASTY, KNEE, TOTAL
Anesthesia: Spinal | Site: Knee | Laterality: Left

## 2020-07-04 MED ORDER — OXYCODONE HCL 5 MG PO TABS
5.0000 mg | ORAL_TABLET | Freq: Once | ORAL | Status: AC | PRN
  Administered 2020-07-04: 5 mg via ORAL

## 2020-07-04 MED ORDER — LACTATED RINGERS IV BOLUS
250.0000 mL | Freq: Once | INTRAVENOUS | Status: AC
  Administered 2020-07-04: 250 mL via INTRAVENOUS

## 2020-07-04 MED ORDER — PROPOFOL 500 MG/50ML IV EMUL
INTRAVENOUS | Status: DC | PRN
  Administered 2020-07-04: 125 ug/kg/min via INTRAVENOUS

## 2020-07-04 MED ORDER — MEPERIDINE HCL 50 MG/ML IJ SOLN: 6.2500 mg | INTRAMUSCULAR | Status: DC | PRN

## 2020-07-04 MED ORDER — ONDANSETRON HCL 4 MG/2ML IJ SOLN: 4.0000 mg | Freq: Once | INTRAMUSCULAR | Status: DC | PRN

## 2020-07-04 MED ORDER — 0.9 % SODIUM CHLORIDE (POUR BTL) OPTIME
TOPICAL | Status: DC | PRN
  Administered 2020-07-04: 1000 mL

## 2020-07-04 MED ORDER — BUPIVACAINE-EPINEPHRINE (PF) 0.5% -1:200000 IJ SOLN
INTRAMUSCULAR | Status: AC
  Filled 2020-07-04: qty 30

## 2020-07-04 MED ORDER — MIDAZOLAM HCL 5 MG/5ML IJ SOLN
INTRAMUSCULAR | Status: DC | PRN
  Administered 2020-07-04 (×2): 1 mg via INTRAVENOUS

## 2020-07-04 MED ORDER — PROPOFOL 10 MG/ML IV BOLUS
INTRAVENOUS | Status: DC | PRN
  Administered 2020-07-04: 20 mg via INTRAVENOUS

## 2020-07-04 MED ORDER — PHENYLEPHRINE HCL-NACL 10-0.9 MG/250ML-% IV SOLN
INTRAVENOUS | Status: DC | PRN
  Administered 2020-07-04: 35 ug/min via INTRAVENOUS

## 2020-07-04 MED ORDER — CEFAZOLIN SODIUM-DEXTROSE 2-4 GM/100ML-% IV SOLN: 2.0000 g | Freq: Four times a day (QID) | INTRAVENOUS | Status: DC

## 2020-07-04 MED ORDER — SODIUM CHLORIDE 0.9 % IR SOLN
Status: DC | PRN
  Administered 2020-07-04: 1000 mL

## 2020-07-04 MED ORDER — FENTANYL CITRATE (PF) 100 MCG/2ML IJ SOLN: 25.0000 ug | INTRAMUSCULAR | Status: DC | PRN

## 2020-07-04 MED ORDER — CHLORHEXIDINE GLUCONATE 0.12 % MT SOLN
15.0000 mL | Freq: Once | OROMUCOSAL | Status: AC
  Administered 2020-07-04: 15 mL via OROMUCOSAL

## 2020-07-04 MED ORDER — TRANEXAMIC ACID-NACL 1000-0.7 MG/100ML-% IV SOLN
1000.0000 mg | INTRAVENOUS | Status: AC
  Administered 2020-07-04: 1000 mg via INTRAVENOUS
  Filled 2020-07-04: qty 100

## 2020-07-04 MED ORDER — ACETAMINOPHEN 325 MG PO TABS: 325.0000 mg | ORAL_TABLET | ORAL | Status: DC | PRN

## 2020-07-04 MED ORDER — POVIDONE-IODINE 10 % EX SWAB
2.0000 "application " | Freq: Once | CUTANEOUS | Status: AC
  Administered 2020-07-04: 2 via TOPICAL

## 2020-07-04 MED ORDER — BUPIVACAINE-EPINEPHRINE 0.5% -1:200000 IJ SOLN
INTRAMUSCULAR | Status: DC | PRN
  Administered 2020-07-04: 30 mL

## 2020-07-04 MED ORDER — PHENYLEPHRINE 40 MCG/ML (10ML) SYRINGE FOR IV PUSH (FOR BLOOD PRESSURE SUPPORT)
PREFILLED_SYRINGE | INTRAVENOUS | Status: AC
  Filled 2020-07-04: qty 10

## 2020-07-04 MED ORDER — SODIUM CHLORIDE 0.9 % IV SOLN: INTRAVENOUS | Status: DC

## 2020-07-04 MED ORDER — FENTANYL CITRATE (PF) 100 MCG/2ML IJ SOLN
INTRAMUSCULAR | Status: AC
  Filled 2020-07-04: qty 2

## 2020-07-04 MED ORDER — BUPIVACAINE IN DEXTROSE 0.75-8.25 % IT SOLN
INTRATHECAL | Status: DC | PRN
  Administered 2020-07-04: 1.8 mL via INTRATHECAL

## 2020-07-04 MED ORDER — LACTATED RINGERS IV BOLUS
500.0000 mL | Freq: Once | INTRAVENOUS | Status: AC
  Administered 2020-07-04: 500 mL via INTRAVENOUS

## 2020-07-04 MED ORDER — OXYCODONE HCL 5 MG/5ML PO SOLN: 5.0000 mg | Freq: Once | ORAL | Status: AC | PRN

## 2020-07-04 MED ORDER — FENTANYL CITRATE (PF) 100 MCG/2ML IJ SOLN
INTRAMUSCULAR | Status: DC | PRN
  Administered 2020-07-04 (×2): 50 ug via INTRAVENOUS

## 2020-07-04 MED ORDER — POVIDONE-IODINE 10 % EX SWAB: 2.0000 "application " | Freq: Once | CUTANEOUS | Status: DC

## 2020-07-04 MED ORDER — MIDAZOLAM HCL 2 MG/2ML IJ SOLN
INTRAMUSCULAR | Status: AC
  Filled 2020-07-04: qty 2

## 2020-07-04 MED ORDER — PROPOFOL 1000 MG/100ML IV EMUL
INTRAVENOUS | Status: AC
  Filled 2020-07-04: qty 100

## 2020-07-04 MED ORDER — ONDANSETRON HCL 4 MG/2ML IJ SOLN
INTRAMUSCULAR | Status: DC | PRN
  Administered 2020-07-04: 4 mg via INTRAVENOUS

## 2020-07-04 MED ORDER — PROPOFOL 500 MG/50ML IV EMUL
INTRAVENOUS | Status: AC
  Filled 2020-07-04: qty 50

## 2020-07-04 MED ORDER — GABAPENTIN 100 MG PO CAPS
100.0000 mg | ORAL_CAPSULE | Freq: Three times a day (TID) | ORAL | 2 refills | Status: DC
Start: 2020-07-04 — End: 2022-09-09

## 2020-07-04 MED ORDER — PHENYLEPHRINE 40 MCG/ML (10ML) SYRINGE FOR IV PUSH (FOR BLOOD PRESSURE SUPPORT)
PREFILLED_SYRINGE | INTRAVENOUS | Status: DC | PRN
  Administered 2020-07-04: 80 ug via INTRAVENOUS

## 2020-07-04 MED ORDER — ACETAMINOPHEN 10 MG/ML IV SOLN
INTRAVENOUS | Status: AC
  Filled 2020-07-04: qty 100

## 2020-07-04 MED ORDER — OXYCODONE HCL 5 MG PO TABS: 5.0000 mg | ORAL_TABLET | ORAL | 0 refills | Status: DC | PRN

## 2020-07-04 MED ORDER — LACTATED RINGERS IV SOLN: INTRAVENOUS | Status: DC

## 2020-07-04 MED ORDER — OXYCODONE HCL ER 10 MG PO T12A: 10.0000 mg | EXTENDED_RELEASE_TABLET | Freq: Two times a day (BID) | ORAL | Status: DC

## 2020-07-04 MED ORDER — OXYCODONE HCL 5 MG PO TABS
ORAL_TABLET | ORAL | Status: AC
  Filled 2020-07-04: qty 1

## 2020-07-04 MED ORDER — ORAL CARE MOUTH RINSE: 15.0000 mL | Freq: Once | OROMUCOSAL | Status: AC

## 2020-07-04 MED ORDER — ACETAMINOPHEN 10 MG/ML IV SOLN
1000.0000 mg | Freq: Once | INTRAVENOUS | Status: AC
  Administered 2020-07-04: 1000 mg via INTRAVENOUS

## 2020-07-04 MED ORDER — STERILE WATER FOR IRRIGATION IR SOLN
Status: DC | PRN
  Administered 2020-07-04: 2000 mL

## 2020-07-04 MED ORDER — CEFAZOLIN SODIUM-DEXTROSE 2-4 GM/100ML-% IV SOLN
2.0000 g | INTRAVENOUS | Status: AC
  Administered 2020-07-04: 2 g via INTRAVENOUS
  Filled 2020-07-04: qty 100

## 2020-07-04 MED ORDER — ACETAMINOPHEN 160 MG/5ML PO SOLN: 325.0000 mg | ORAL | Status: DC | PRN

## 2020-07-04 MED ORDER — DICLOFENAC SODIUM 1 % EX GEL: 2.0000 g | Freq: Four times a day (QID) | CUTANEOUS | 1 refills | Status: DC | PRN

## 2020-07-04 MED FILL — DICLOFENAC SODIUM 1 % GEL: 1 | 7 days supply | Qty: 100 | Fill #0

## 2020-07-04 SURGICAL SUPPLY — 57 items
BAG DECANTER FOR FLEXI CONT (MISCELLANEOUS) ×2 IMPLANT
BAG SPEC THK2 15X12 ZIP CLS (MISCELLANEOUS) ×1
BAG ZIPLOCK 12X15 (MISCELLANEOUS) ×2 IMPLANT
BLADE SAGITTAL 25.0X1.19X90 (BLADE) ×2 IMPLANT
BOWL SMART MIX CTS (DISPOSABLE) ×2 IMPLANT
CEMENT HV SMART SET (Cement) ×4 IMPLANT
CEMENT TIBIA MBT SIZE 4 (Knees) IMPLANT
COMP FEM CEM STD+ LT LCS (Orthopedic Implant) ×2 IMPLANT
COMP PATELLA PEGX3 CEM STAN+ (Knees) ×2 IMPLANT
COMPONENT FEM CEM STD+ LT LCS (Orthopedic Implant) IMPLANT
COMPONENT PTLA PEGX3 CEM STAN+ (Knees) IMPLANT
COVER SURGICAL LIGHT HANDLE (MISCELLANEOUS) ×2 IMPLANT
COVER WAND RF STERILE (DRAPES) IMPLANT
CUFF TOURN SGL QUICK 34 (TOURNIQUET CUFF) ×2
CUFF TRNQT CYL 34X4.125X (TOURNIQUET CUFF) ×1 IMPLANT
DECANTER SPIKE VIAL GLASS SM (MISCELLANEOUS) ×2 IMPLANT
DRAPE IMP U-DRAPE 54X76 (DRAPES) ×2 IMPLANT
DRAPE ORTHO SPLIT 77X108 STRL (DRAPES)
DRAPE SHEET LG 3/4 BI-LAMINATE (DRAPES) ×4 IMPLANT
DRAPE SURG ORHT 6 SPLT 77X108 (DRAPES) IMPLANT
DRSG ADAPTIC 3X8 NADH LF (GAUZE/BANDAGES/DRESSINGS) ×2 IMPLANT
DRSG AQUACEL AG ADV 3.5X10 (GAUZE/BANDAGES/DRESSINGS) ×1 IMPLANT
DRSG PAD ABDOMINAL 8X10 ST (GAUZE/BANDAGES/DRESSINGS) ×2 IMPLANT
DURAPREP 26ML APPLICATOR (WOUND CARE) ×4 IMPLANT
ELECT REM PT RETURN 15FT ADLT (MISCELLANEOUS) ×2 IMPLANT
GAUZE SPONGE 4X4 12PLY STRL (GAUZE/BANDAGES/DRESSINGS) ×2 IMPLANT
GLOVE BIOGEL PI IND STRL 8 (GLOVE) ×1 IMPLANT
GLOVE BIOGEL PI IND STRL 8.5 (GLOVE) ×1 IMPLANT
GLOVE BIOGEL PI INDICATOR 8 (GLOVE) ×1
GLOVE BIOGEL PI INDICATOR 8.5 (GLOVE) ×1
GLOVE ECLIPSE 8.0 STRL XLNG CF (GLOVE) ×4 IMPLANT
GLOVE ECLIPSE 8.5 STRL (GLOVE) ×4 IMPLANT
GOWN STRL REUS W/ TWL LRG LVL3 (GOWN DISPOSABLE) ×1 IMPLANT
GOWN STRL REUS W/TWL 2XL LVL3 (GOWN DISPOSABLE) ×2 IMPLANT
GOWN STRL REUS W/TWL LRG LVL3 (GOWN DISPOSABLE) ×2
HANDPIECE INTERPULSE COAX TIP (DISPOSABLE) ×2
HOLDER FOLEY CATH W/STRAP (MISCELLANEOUS) IMPLANT
INSERT TIB LCS RP STD+ 12.5 (Knees) ×1 IMPLANT
KIT TURNOVER KIT A (KITS) IMPLANT
MANIFOLD NEPTUNE II (INSTRUMENTS) ×2 IMPLANT
NS IRRIG 1000ML POUR BTL (IV SOLUTION) ×2 IMPLANT
PACK TOTAL KNEE CUSTOM (KITS) ×2 IMPLANT
PADDING CAST COTTON 6X4 STRL (CAST SUPPLIES) ×4 IMPLANT
PENCIL SMOKE EVACUATOR (MISCELLANEOUS) IMPLANT
PIN STEINMAN FIXATION KNEE (PIN) ×1 IMPLANT
PROTECTOR NERVE ULNAR (MISCELLANEOUS) ×2 IMPLANT
SET HNDPC FAN SPRY TIP SCT (DISPOSABLE) ×1 IMPLANT
STAPLER VISISTAT 35W (STAPLE) ×2 IMPLANT
SUT BONE WAX W31G (SUTURE) ×2 IMPLANT
SUT ETHIBOND NAB CT1 #1 30IN (SUTURE) ×4 IMPLANT
SUT MNCRL AB 3-0 PS2 18 (SUTURE) ×2 IMPLANT
SUT VIC AB 2-0 PS2 27 (SUTURE) ×2 IMPLANT
TIBIA MBT CEMENT SIZE 4 (Knees) ×2 IMPLANT
TRAY FOLEY MTR SLVR 16FR STAT (SET/KITS/TRAYS/PACK) ×2 IMPLANT
UNDERPAD 30X36 HEAVY ABSORB (UNDERPADS AND DIAPERS) ×2 IMPLANT
WATER STERILE IRR 1000ML POUR (IV SOLUTION) ×4 IMPLANT
WRAP KNEE MAXI GEL POST OP (GAUZE/BANDAGES/DRESSINGS) ×2 IMPLANT

## 2020-07-04 NOTE — Anesthesia Postprocedure Evaluation (Signed)
Anesthesia Post Note  Patient: Julia [name]  Procedure(s) Performed: LEFT TOTAL KNEE ARTHROPLASTY (Left Knee)     Patient location during evaluation: PACU Anesthesia Type: Spinal Level of consciousness: oriented and awake and alert Pain management: pain level controlled Vital Signs Assessment: post-procedure vital signs reviewed and stable Respiratory status: spontaneous breathing, respiratory function stable and patient connected to nasal cannula oxygen Cardiovascular status: blood pressure returned to baseline and stable Postop Assessment: no headache, no backache and no apparent nausea or vomiting Anesthetic complications: no   No complications documented.  Last Vitals:  Vitals:   07/04/20 0556  BP: 134/80  Pulse: 87  Resp: 16  Temp: 36.8 C  SpO2: 97%    Last Pain:  Vitals:   07/04/20 0556  TempSrc: Oral  PainSc:                  Gavon Majano

## 2020-07-04 NOTE — H&P (Signed)
The recent History & Physical has been reviewed. I have personally examined the patient today. There is no interval change to the documented History & Physical. The patient would like to proceed with the procedure.  Valeria Batman 07/04/2020,  7:13 AM

## 2020-07-04 NOTE — Progress Notes (Signed)
Orthopedic Tech Progress Note Patient Details:  Julia [name] [birth year] [id number]  CPM Left Knee CPM Left Knee: On Left Knee Flexion (Degrees): 90 Left Knee Extension (Degrees): 0  Post Interventions Patient Tolerated: Well Instructions Provided: Care of device  Saul Fordyce 07/04/2020, 10:33 AM

## 2020-07-04 NOTE — Transfer of Care (Signed)
Immediate Anesthesia Transfer of Care Note  Patient: Julia [name]  Procedure(s) Performed: LEFT TOTAL KNEE ARTHROPLASTY (Left Knee)  Patient Location: PACU  Anesthesia Type:Spinal  Level of Consciousness: awake, alert  and oriented  Airway & Oxygen Therapy: Patient Spontanous Breathing and Patient connected to face mask oxygen  Post-op Assessment: Report given to RN and Post -op Vital signs reviewed and stable  Post vital signs: Reviewed and stable  Last Vitals:  Vitals Value Taken Time  BP 115/70 07/04/20 1003  Temp    Pulse 86 07/04/20 1006  Resp 18 07/04/20 1006  SpO2 99 % 07/04/20 1006  Vitals shown include unvalidated device data.  Last Pain:  Vitals:   07/04/20 0556  TempSrc: Oral  PainSc:          Complications: No complications documented.

## 2020-07-04 NOTE — Anesthesia Procedure Notes (Signed)
Anesthesia Regional Block: Adductor canal block   Pre-Anesthetic Checklist: ,, timeout performed, Correct Patient, Correct Site, Correct Laterality, Correct Procedure, Correct Position, site marked, Risks and benefits discussed,  Surgical consent,  Pre-op evaluation,  At surgeon's request and post-op pain management  Laterality: Left  Prep: chloraprep       Needles:  Injection technique: Single-shot  Needle Type: Echogenic Stimulator Needle     Needle Length: 5cm  Needle Gauge: 22     Additional Needles:   Procedures:, nerve stimulator,,, ultrasound used (permanent image in chart),,,,  Narrative:  Start time: 07/04/2020 7:00 AM End time: 07/04/2020 7:05 AM Injection made incrementally with aspirations every 5 mL.  Performed by: Personally  Anesthesiologist: Bethena Midget, MD  Additional Notes: Functioning IV was confirmed and monitors were applied.  A 90mm 22ga Arrow echogenic stimulator needle was used. Sterile prep and drape,hand hygiene and sterile gloves were used. Ultrasound guidance: relevant anatomy identified, needle position confirmed, local anesthetic spread visualized around nerve(s)., vascular puncture avoided.  Image printed for medical record. Negative aspiration and negative test dose prior to incremental administration of local anesthetic. The patient tolerated the procedure well.

## 2020-07-04 NOTE — Discharge Summary (Signed)
[name], MD   [name], PA-C [address] [place], [place]  [address]                             [phone]  PATIENT ID: Julia [name]        MRN:  [id number]          DOB/AGE: [birth year] / 54 y.o.    DISCHARGE SUMMARY  ADMISSION DATE:    07/04/2020 DISCHARGE DATE:   07/04/2020   ADMISSION DIAGNOSIS: LEFT KNEE OSTEOARTHRITIS    DISCHARGE DIAGNOSIS:  LEFT KNEE OSTEOARTHRITIS    ADDITIONAL DIAGNOSIS: Active Problems:   * No active hospital problems. *  Past Medical History:  Diagnosis Date  . Anxiety   . Arthritis    knees, hands  . Depression   . Hypertension     PROCEDURE: Procedure(s): LEFT TOTAL KNEE ARTHROPLASTY  on 07/04/2020  CONSULTS: none    HISTORY: Patient is a 54 y.o. female presented with a history of pain in the left knee for 7 years. Onset of symptoms was gradual starting 10 year ago with rapidly worsening course since that time. Prior procedures on the knee are none. Patient has been treated conservatively with over-the-counter NSAIDs and activity modification. Patient currently rates pain in the knee at 8 out of 10 with activity. There is pain at night. present.  They have been previously treated with: NSAIDS: Ibuprofen, Naprosyn, Steriods, Tylenol with mild improvement  Knee injection with corticosteroid  was performed Knee injection with visco supplementation was not performed Medications: Motrin, Tylenol with mild improvementchart  HOSPITAL COURSE:  DESIRAI TRAXLER is a 54 y.o. admitted on 07/04/2020 and found to have a diagnosis of LEFT KNEE OSTEOARTHRITIS.  After appropriate laboratory studies were obtained  they were taken to the operating room on 07/04/2020 and underwent  Procedure(s): LEFT TOTAL KNEE ARTHROPLASTY  .   They were given perioperative antibiotics:  Anti-infectives (From admission, onward)   Start     Dose/Rate Route Frequency Ordered Stop   07/04/20 1400  ceFAZolin (ANCEF) IVPB 2g/100 mL premix        2 g 200  mL/hr over 30 Minutes Intravenous Every 6 hours 07/04/20 1038 07/05/20 0159   07/04/20 0600  ceFAZolin (ANCEF) IVPB 2g/100 mL premix        2 g 200 mL/hr over 30 Minutes Intravenous On call to O.R. 07/04/20 2130 07/04/20 0756    .  Tolerated the procedure well.  Placed with a foley intraoperatively and discontinued post op prior to discharge.      The remainder of the hospital course was dedicated to ambulation and strengthening.   The patient was discharged on Day of Surgery in  Stable condition.  Blood products given:none  DIAGNOSTIC STUDIES: Recent vital signs:  Patient Vitals for the past 24 hrs:  BP Temp Temp src Pulse Resp SpO2 Weight  07/04/20 1045 122/70 -- -- 69 19 96 % --  07/04/20 1030 (!) 115/57 -- -- 73 16 94 % --  07/04/20 1015 116/69 -- -- 78 18 96 % --  07/04/20 1003 115/70 97.8 F (36.6 C) -- 85 20 98 % --  07/04/20 0556 134/80 98.3 F (36.8 C) Oral 87 16 97 % --  07/04/20 0547 -- -- -- -- -- -- 116.1 kg       Recent laboratory studies: Recent Labs    06/29/20 1007  WBC 6.2  HGB 13.0  HCT 39.8  PLT 345   Recent Labs    06/29/20 1007  NA 139  K 4.0  CL 99  CO2 27  BUN 17  CREATININE 0.82  GLUCOSE 103*  CALCIUM 9.5   Lab Results  Component Value Date   INR 1.0 06/29/2020   INR 1.0 12/03/2019     Recent Radiographic Studies :  No results found.  DISCHARGE INSTRUCTIONS: Discharge Instructions    CPM   Complete by: As directed    Continuous passive motion machine (CPM):      Use the CPM from 0 to 60 degrees for 6-8 hours per day.      You may increase by 5-10 per day.  You may break it up into 2 or 3 sessions per day.      Use CPM for 3-4 weeks or until you are told to stop.   CPM   Complete by: As directed    Continuous passive motion machine (CPM):      Use the CPM from 0 to 60 degrees for 6-8 hours per day.      You may increase by 5-10 per day.  You may break it up into 2 or 3 sessions per day.      Use CPM for 3-4 weeks or until  you are told to stop.   CPM   Complete by: As directed    Continuous passive motion machine (CPM):      Use the CPM from 0 to 60 degrees for 6-8 hours per day.      You may increase by 5-10 per day.  You may break it up into 2 or 3 sessions per day.      Use CPM for 3-4 weeks or until you are told to stop.   Call MD / Call 911   Complete by: As directed    If you experience chest pain or shortness of breath, CALL 911 and be transported to the hospital emergency room.  If you develope a fever above 101 F, pus (white drainage) or increased drainage or redness at the wound, or calf pain, call your surgeon's office.   Call MD / Call 911   Complete by: As directed    If you experience chest pain or shortness of breath, CALL 911 and be transported to the hospital emergency room.  If you develope a fever above 101 F, pus (white drainage) or increased drainage or redness at the wound, or calf pain, call your surgeon's office.   Call MD / Call 911   Complete by: As directed    If you experience chest pain or shortness of breath, CALL 911 and be transported to the hospital emergency room.  If you develope a fever above 101 F, pus (white drainage) or increased drainage or redness at the wound, or calf pain, call your surgeon's office.   Change dressing   Complete by: As directed    DO NOT CHANGE YOUR DRESSING.   Change dressing   Complete by: As directed    DO NOT CHANGE YOUR DRESSING.   Change dressing   Complete by: As directed    DO NOT CHANGE YOUR DRESSING.   Constipation Prevention   Complete by: As directed    Drink plenty of fluids.  Prune juice may be helpful.  You may use a stool softener, such as Colace (over the counter) 100 mg twice a day.  Use MiraLax (over the counter) for constipation as needed.   Constipation Prevention  Complete by: As directed    Drink plenty of fluids.  Prune juice may be helpful.  You may use a stool softener, such as Colace (over the counter) 100 mg twice a  day.  Use MiraLax (over the counter) for constipation as needed.   Constipation Prevention   Complete by: As directed    Drink plenty of fluids.  Prune juice may be helpful.  You may use a stool softener, such as Colace (over the counter) 100 mg twice a day.  Use MiraLax (over the counter) for constipation as needed.   Diet general   Complete by: As directed    Discharge instructions   Complete by: As directed    INSTRUCTIONS AFTER JOINT REPLACEMENT   Remove items at home which could result in a fall. This includes throw rugs or furniture in walking pathways ICE to the affected joint every three hours while awake for 30 minutes at a time, for at least the first 3-5 days, and then as needed for pain and swelling.  Continue to use ice for pain and swelling. You may notice swelling that will progress down to the foot and ankle.  This is normal after surgery.  Elevate your leg when you are not up walking on it.   Continue to use the breathing machine you got in the hospital (incentive spirometer) which will help keep your temperature down.  It is common for your temperature to cycle up and down following surgery, especially at night when you are not up moving around and exerting yourself.  The breathing machine keeps your lungs expanded and your temperature down.   DIET:  As you were doing prior to hospitalization, we recommend a well-balanced diet.  DRESSING / WOUND CARE / SHOWERING  Keep the surgical dressing until follow up.  The dressing is water proof, so you can shower without any extra covering.  IF THE DRESSING FALLS OFF or the wound gets wet inside, change the dressing with sterile gauze.  Please use good hand washing techniques before changing the dressing.  Do not use any lotions or creams on the incision until instructed by your surgeon.    ACTIVITY  Increase activity slowly as tolerated, but follow the weight bearing instructions below.   No driving for 6 weeks or until further  direction given by your physician.  You cannot drive while taking narcotics.  No lifting or carrying greater than 10 lbs. until further directed by your surgeon. Avoid periods of inactivity such as sitting longer than an hour when not asleep. This helps prevent blood clots.  You may return to work once you are authorized by your doctor.     WEIGHT BEARING   weight bearing with assist device as directed.     EXERCISES  Results after joint replacement surgery are often greatly improved when you follow the exercise, range of motion and muscle strengthening exercises prescribed by your doctor. Safety measures are also important to protect the joint from further injury. Any time any of these exercises cause you to have increased pain or swelling, decrease what you are doing until you are comfortable again and then slowly increase them. If you have problems or questions, call your caregiver or physical therapist for advice.   Rehabilitation is important following a joint replacement. After just a few days of immobilization, the muscles of the leg can become weakened and shrink (atrophy).  These exercises are designed to build up the tone and strength of the thigh and leg  muscles and to improve motion. Often times heat used for twenty to thirty minutes before working out will loosen up your tissues and help with improving the range of motion but do not use heat for the first two weeks following surgery (sometimes heat can increase post-operative swelling).   These exercises can be done on a training (exercise) mat,  on a table or on a bed. Use whatever works the best and is most comfortable for you.    Use music or television while you are exercising so that the exercises are a pleasant break in your day. This will make your life better with the exercises acting as a break in your routine that you can look forward to.   Perform all exercises about fifteen times, three times per day or as directed.  You  should exercise both the operative leg and the other leg as well.   Exercises include:  Quad Sets - Tighten up the muscle on the front of the thigh (Quad) and hold for 5-10 seconds.   Straight Leg Raises - With your knee straight (if you were given a brace, keep it on), lift the leg to 60 degrees, hold for 3 seconds, and slowly lower the leg.  Perform this exercise against resistance later as your leg gets stronger.  Leg Slides: Lying on your back, slowly slide your foot toward your buttocks, bending your knee up off the floor (only go as far as is comfortable). Then slowly slide your foot back down until your leg is flat on the floor again.  Angel Wings: Lying on your back spread your legs to the side as far apart as you can without causing discomfort.  Hamstring Strength:  Lying on your back, push your heel against the floor with your leg straight by tightening up the muscles of your buttocks.  Repeat, but this time bend your knee to a comfortable angle, and push your heel against the floor.  You may put a pillow under the heel to make it more comfortable if necessary.   A rehabilitation program following joint replacement surgery can speed recovery and prevent re-injury in the future due to weakened muscles. Contact your doctor or a physical therapist for more information on knee rehabilitation.    CONSTIPATION  Constipation is defined medically as fewer than three stools per week and severe constipation as less than one stool per week.  Even if you have a regular bowel pattern at home, your normal regimen is likely to be disrupted due to multiple reasons following surgery.  Combination of anesthesia, postoperative narcotics, change in appetite and fluid intake all can affect your bowels.   YOU MUST use at least one of the following options; they are listed in order of increasing strength to get the job done.  They are all available over the counter, and you may need to use some, POSSIBLY even  all of these options:    Drink plenty of fluids (prune juice may be helpful) and high fiber foods Colace 100 mg by mouth twice a day  Senokot for constipation as directed and as needed Dulcolax (bisacodyl), take with full glass of water  Miralax (polyethylene glycol) once or twice a day as needed.  If you have tried all these things and are unable to have a bowel movement in the first 3-4 days after surgery call either your surgeon or your primary doctor.    If you experience loose stools or diarrhea, hold the medications until you stool forms  back up.  If your symptoms do not get better within 1 week or if they get worse, check with your doctor.  If you experience "the worst abdominal pain ever" or develop nausea or vomiting, please contact the office immediately for further recommendations for treatment.   ITCHING:  If you experience itching with your medications, try taking only a single pain pill, or even half a pain pill at a time.  You can also use Benadryl over the counter for itching or also to help with sleep.   TED HOSE STOCKINGS:  Use stockings on both legs until for at least 2 weeks or as directed by physician office. They may be removed at night for sleeping.  MEDICATIONS:  See your medication summary on the "After Visit Summary" that nursing will review with you.  You may have some home medications which will be placed on hold until you complete the course of blood thinner medication.  It is important for you to complete the blood thinner medication as prescribed.  PRECAUTIONS:  If you experience chest pain or shortness of breath - call 911 immediately for transfer to the hospital emergency department.   If you develop a fever greater that 101 F, purulent drainage from wound, increased redness or drainage from wound, foul odor from the wound/dressing, or calf pain - CONTACT YOUR SURGEON.                                                   FOLLOW-UP APPOINTMENTS:  If you do not  already have a post-op appointment, please call the office for an appointment to be seen by your surgeon.  Guidelines for how soon to be seen are listed in your "After Visit Summary", but are typically between 1-4 weeks after surgery.  OTHER INSTRUCTIONS:   Knee Replacement:  Do not place pillow under knee, focus on keeping the knee straight while resting. CPM instructions: 0-90 degrees, 2 hours in the morning, 2 hours in the afternoon, and 2 hours in the evening. Place foam block, curve side up under heel at all times except when in CPM or when walking.  DO NOT modify, tear, cut, or change the foam block in any way.  MAKE SURE YOU:  Understand these instructions.  Get help right away if you are not doing well or get worse.    Thank you for letting us be a part of your medical care team.  It is a privilege we respect greatly.  We hope these instructions will help you stay on track for a fast and full recovery!   Discharge instructions   Complete by: As directed    INSTRUCTIONS AFTER JOINT REPLACEMENT   Remove items at home which could result in a fall. This includes throw rugs or furniture in walking pathways ICE to the affected joint every three hours while awake for 30 minutes at a time, for at least the first 3-5 days, and then as needed for pain and swelling.  Continue to use ice for pain and swelling. You may notice swelling that will progress down to the foot and ankle.  This is normal after surgery.  Elevate your leg when you are not up walking on it.   Continue to use the breathing machine you got in the hospital (incentive spirometer) which will help keep your temperature down.  It  is common for your temperature to cycle up and down following surgery, especially at night when you are not up moving around and exerting yourself.  The breathing machine keeps your lungs expanded and your temperature down.   DIET:  As you were doing prior to hospitalization, we recommend a well-balanced  diet.  DRESSING / WOUND CARE / SHOWERING  Keep the surgical dressing until follow up.  The dressing is water proof, so you can shower without any extra covering.  IF THE DRESSING FALLS OFF or the wound gets wet inside, change the dressing with sterile gauze.  Please use good hand washing techniques before changing the dressing.  Do not use any lotions or creams on the incision until instructed by your surgeon.    ACTIVITY  Increase activity slowly as tolerated, but follow the weight bearing instructions below.   No driving for 6 weeks or until further direction given by your physician.  You cannot drive while taking narcotics.  No lifting or carrying greater than 10 lbs. until further directed by your surgeon. Avoid periods of inactivity such as sitting longer than an hour when not asleep. This helps prevent blood clots.  You may return to work once you are authorized by your doctor.     WEIGHT BEARING   Weight bearing as tolerated with assist device (walker, cane, etc) as directed, use it as long as suggested by your surgeon or therapist, typically at least 4-6 weeks.   EXERCISES  Results after joint replacement surgery are often greatly improved when you follow the exercise, range of motion and muscle strengthening exercises prescribed by your doctor. Safety measures are also important to protect the joint from further injury. Any time any of these exercises cause you to have increased pain or swelling, decrease what you are doing until you are comfortable again and then slowly increase them. If you have problems or questions, call your caregiver or physical therapist for advice.   Rehabilitation is important following a joint replacement. After just a few days of immobilization, the muscles of the leg can become weakened and shrink (atrophy).  These exercises are designed to build up the tone and strength of the thigh and leg muscles and to improve motion. Often times heat used for twenty  to thirty minutes before working out will loosen up your tissues and help with improving the range of motion but do not use heat for the first two weeks following surgery (sometimes heat can increase post-operative swelling).   These exercises can be done on a training (exercise) mat, on the floor, on a table or on a bed. Use whatever works the best and is most comfortable for you.    Use music or television while you are exercising so that the exercises are a pleasant break in your day. This will make your life better with the exercises acting as a break in your routine that you can look forward to.   Perform all exercises about fifteen times, three times per day or as directed.  You should exercise both the operative leg and the other leg as well.  Exercises include:   Quad Sets - Tighten up the muscle on the front of the thigh (Quad) and hold for 5-10 seconds.   Straight Leg Raises - With your knee straight (if you were given a brace, keep it on), lift the leg to 60 degrees, hold for 3 seconds, and slowly lower the leg.  Perform this exercise against resistance later as your  leg gets stronger.  Leg Slides: Lying on your back, slowly slide your foot toward your buttocks, bending your knee up off the floor (only go as far as is comfortable). Then slowly slide your foot back down until your leg is flat on the floor again.  Angel Wings: Lying on your back spread your legs to the side as far apart as you can without causing discomfort.  Hamstring Strength:  Lying on your back, push your heel against the floor with your leg straight by tightening up the muscles of your buttocks.  Repeat, but this time bend your knee to a comfortable angle, and push your heel against the floor.  You may put a pillow under the heel to make it more comfortable if necessary.   A rehabilitation program following joint replacement surgery can speed recovery and prevent re-injury in the future due to weakened muscles. Contact your  doctor or a physical therapist for more information on knee rehabilitation.    CONSTIPATION  Constipation is defined medically as fewer than three stools per week and severe constipation as less than one stool per week.  Even if you have a regular bowel pattern at home, your normal regimen is likely to be disrupted due to multiple reasons following surgery.  Combination of anesthesia, postoperative narcotics, change in appetite and fluid intake all can affect your bowels.   YOU MUST use at least one of the following options; they are listed in order of increasing strength to get the job done.  They are all available over the counter, and you may need to use some, POSSIBLY even all of these options:    Drink plenty of fluids (prune juice may be helpful) and high fiber foods Colace 100 mg by mouth twice a day  Senokot for constipation as directed and as needed Dulcolax (bisacodyl), take with full glass of water  Miralax (polyethylene glycol) once or twice a day as needed.  If you have tried all these things and are unable to have a bowel movement in the first 3-4 days after surgery call either your surgeon or your primary doctor.    If you experience loose stools or diarrhea, hold the medications until you stool forms back up.  If your symptoms do not get better within 1 week or if they get worse, check with your doctor.  If you experience "the worst abdominal pain ever" or develop nausea or vomiting, please contact the office immediately for further recommendations for treatment.   ITCHING:  If you experience itching with your medications, try taking only a single pain pill, or even half a pain pill at a time.  You can also use Benadryl over the counter for itching or also to help with sleep.   TED HOSE STOCKINGS:  Use stockings on both legs until for at least 2 weeks or as directed by physician office. They may be removed at night for sleeping.  MEDICATIONS:  See your medication summary on the  "After Visit Summary" that nursing will review with you.  You may have some home medications which will be placed on hold until you complete the course of blood thinner medication.  It is important for you to complete the blood thinner medication as prescribed.  PRECAUTIONS:  If you experience chest pain or shortness of breath - call 911 immediately for transfer to the hospital emergency department.   If you develop a fever greater that 101 F, purulent drainage from wound, increased redness or drainage from wound,  foul odor from the wound/dressing, or calf pain - CONTACT YOUR SURGEON.                                                   FOLLOW-UP APPOINTMENTS:  If you do not already have a post-op appointment, please call the office for an appointment to be seen by your surgeon.  Guidelines for how soon to be seen are listed in your "After Visit Summary", but are typically between 1-4 weeks after surgery.  OTHER INSTRUCTIONS:   Knee Replacement:  Do not place pillow under knee, focus on keeping the knee straight while resting. CPM instructions: 0-90 degrees, 2 hours in the morning, 2 hours in the afternoon, and 2 hours in the evening. Place foam block, curve side up under heel at all times except when in CPM or when walking.  DO NOT modify, tear, cut, or change the foam block in any way.   DENTAL ANTIBIOTICS:  In most cases prophylactic antibiotics for Dental procdeures after total joint surgery are not necessary.  Exceptions are as follows:  1. History of prior total joint infection  2. Severely immunocompromised (Organ Transplant, cancer chemotherapy, Rheumatoid biologic meds such as Humera)  3. Poorly controlled diabetes (A1C &gt; 8.0, blood glucose over 200)  If you have one of these conditions, contact your surgeon for an antibiotic prescription, prior to your dental procedure.   MAKE SURE YOU:  Understand these instructions.  Get help right away if you are not doing well or get  worse.    Thank you for letting us be a part of your medical care team.  It is a privilege we respect greatly.  We hope these instructions will help you stay on track for a fast and full recovery!   Discharge instructions   Complete by: As directed    INSTRUCTIONS AFTER JOINT REPLACEMENT   Remove items at home which could result in a fall. This includes throw rugs or furniture in walking pathways ICE to the affected joint every three hours while awake for 30 minutes at a time, for at least the first 3-5 days, and then as needed for pain and swelling.  Continue to use ice for pain and swelling. You may notice swelling that will progress down to the foot and ankle.  This is normal after surgery.  Elevate your leg when you are not up walking on it.   Continue to use the breathing machine you got in the hospital (incentive spirometer) which will help keep your temperature down.  It is common for your temperature to cycle up and down following surgery, especially at night when you are not up moving around and exerting yourself.  The breathing machine keeps your lungs expanded and your temperature down.   DIET:  As you were doing prior to hospitalization, we recommend a well-balanced diet.  DRESSING / WOUND CARE / SHOWERING  Keep the surgical dressing until follow up.  The dressing is water proof, so you can shower without any extra covering.  IF THE DRESSING FALLS OFF or the wound gets wet inside, change the dressing with sterile gauze.  Please use good hand washing techniques before changing the dressing.  Do not use any lotions or creams on the incision until instructed by your surgeon.    ACTIVITY  Increase activity slowly as tolerated, but follow  the weight bearing instructions below.   No driving for 6 weeks or until further direction given by your physician.  You cannot drive while taking narcotics.  No lifting or carrying greater than 10 lbs. until further directed by your surgeon. Avoid  periods of inactivity such as sitting longer than an hour when not asleep. This helps prevent blood clots.  You may return to work once you are authorized by your doctor.     WEIGHT BEARING   Weight bearing as tolerated with assist device (walker, cane, etc) as directed, use it as long as suggested by your surgeon or therapist, typically at least 4-6 weeks.   EXERCISES  Results after joint replacement surgery are often greatly improved when you follow the exercise, range of motion and muscle strengthening exercises prescribed by your doctor. Safety measures are also important to protect the joint from further injury. Any time any of these exercises cause you to have increased pain or swelling, decrease what you are doing until you are comfortable again and then slowly increase them. If you have problems or questions, call your caregiver or physical therapist for advice.   Rehabilitation is important following a joint replacement. After just a few days of immobilization, the muscles of the leg can become weakened and shrink (atrophy).  These exercises are designed to build up the tone and strength of the thigh and leg muscles and to improve motion. Often times heat used for twenty to thirty minutes before working out will loosen up your tissues and help with improving the range of motion but do not use heat for the first two weeks following surgery (sometimes heat can increase post-operative swelling).   These exercises can be done on a training (exercise) mat, on the floor, on a table or on a bed. Use whatever works the best and is most comfortable for you.    Use music or television while you are exercising so that the exercises are a pleasant break in your day. This will make your life better with the exercises acting as a break in your routine that you can look forward to.   Perform all exercises about fifteen times, three times per day or as directed.  You should exercise both the operative leg  and the other leg as well.  Exercises include:   Quad Sets - Tighten up the muscle on the front of the thigh (Quad) and hold for 5-10 seconds.   Straight Leg Raises - With your knee straight (if you were given a brace, keep it on), lift the leg to 60 degrees, hold for 3 seconds, and slowly lower the leg.  Perform this exercise against resistance later as your leg gets stronger.  Leg Slides: Lying on your back, slowly slide your foot toward your buttocks, bending your knee up off the floor (only go as far as is comfortable). Then slowly slide your foot back down until your leg is flat on the floor again.  Angel Wings: Lying on your back spread your legs to the side as far apart as you can without causing discomfort.  Hamstring Strength:  Lying on your back, push your heel against the floor with your leg straight by tightening up the muscles of your buttocks.  Repeat, but this time bend your knee to a comfortable angle, and push your heel against the floor.  You may put a pillow under the heel to make it more comfortable if necessary.   A rehabilitation program following joint replacement surgery  can speed recovery and prevent re-injury in the future due to weakened muscles. Contact your doctor or a physical therapist for more information on knee rehabilitation.    CONSTIPATION  Constipation is defined medically as fewer than three stools per week and severe constipation as less than one stool per week.  Even if you have a regular bowel pattern at home, your normal regimen is likely to be disrupted due to multiple reasons following surgery.  Combination of anesthesia, postoperative narcotics, change in appetite and fluid intake all can affect your bowels.   YOU MUST use at least one of the following options; they are listed in order of increasing strength to get the job done.  They are all available over the counter, and you may need to use some, POSSIBLY even all of these options:    Drink plenty  of fluids (prune juice may be helpful) and high fiber foods Colace 100 mg by mouth twice a day  Senokot for constipation as directed and as needed Dulcolax (bisacodyl), take with full glass of water  Miralax (polyethylene glycol) once or twice a day as needed.  If you have tried all these things and are unable to have a bowel movement in the first 3-4 days after surgery call either your surgeon or your primary doctor.    If you experience loose stools or diarrhea, hold the medications until you stool forms back up.  If your symptoms do not get better within 1 week or if they get worse, check with your doctor.  If you experience "the worst abdominal pain ever" or develop nausea or vomiting, please contact the office immediately for further recommendations for treatment.   ITCHING:  If you experience itching with your medications, try taking only a single pain pill, or even half a pain pill at a time.  You can also use Benadryl over the counter for itching or also to help with sleep.   TED HOSE STOCKINGS:  Use stockings on both legs until for at least 2 weeks or as directed by physician office. They may be removed at night for sleeping.  MEDICATIONS:  See your medication summary on the "After Visit Summary" that nursing will review with you.  You may have some home medications which will be placed on hold until you complete the course of blood thinner medication.  It is important for you to complete the blood thinner medication as prescribed.  PRECAUTIONS:  If you experience chest pain or shortness of breath - call 911 immediately for transfer to the hospital emergency department.   If you develop a fever greater that 101 F, purulent drainage from wound, increased redness or drainage from wound, foul odor from the wound/dressing, or calf pain - CONTACT YOUR SURGEON.                                                   FOLLOW-UP APPOINTMENTS:  If you do not already have a post-op appointment, please  call the office for an appointment to be seen by your surgeon.  Guidelines for how soon to be seen are listed in your "After Visit Summary", but are typically between 1-4 weeks after surgery.  OTHER INSTRUCTIONS:   Knee Replacement:  Do not place pillow under knee, focus on keeping the knee straight while resting. CPM instructions: 0-90 degrees, 2 hours in  the morning, 2 hours in the afternoon, and 2 hours in the evening. Place foam block, curve side up under heel at all times except when in CPM or when walking.  DO NOT modify, tear, cut, or change the foam block in any way.   DENTAL ANTIBIOTICS:  In most cases prophylactic antibiotics for Dental procdeures after total joint surgery are not necessary.  Exceptions are as follows:  1. History of prior total joint infection  2. Severely immunocompromised (Organ Transplant, cancer chemotherapy, Rheumatoid biologic meds such as Humera)  3. Poorly controlled diabetes (A1C &gt; 8.0, blood glucose over 200)  If you have one of these conditions, contact your surgeon for an antibiotic prescription, prior to your dental procedure.   MAKE SURE YOU:  Understand these instructions.  Get help right away if you are not doing well or get worse.   .tomd  Thank you for letting us be a part of your medical care team.  It is a privilege we respect greatly.  We hope these instructions will help you stay on track for a fast and full recovery!   Do not put a pillow under the knee. Place it under the heel.   Complete by: As directed    Do not put a pillow under the knee. Place it under the heel.   Complete by: As directed    Do not put a pillow under the knee. Place it under the heel.   Complete by: As directed    Driving restrictions   Complete by: As directed    No driving for 6 weeks   Driving restrictions   Complete by: As directed    No driving for 6 weeks   Driving restrictions   Complete by: As directed    No driving for 6 weeks    Increase activity slowly as tolerated   Complete by: As directed    Increase activity slowly as tolerated   Complete by: As directed    Increase activity slowly as tolerated   Complete by: As directed    Lifting restrictions   Complete by: As directed    No lifting for 6 weeks   Lifting restrictions   Complete by: As directed    No lifting for 6 weeks   Lifting restrictions   Complete by: As directed    No lifting for 6 weeks   Partial weight bearing   Complete by: As directed    % Body Weight: 50%   Laterality: left   Extremity: Lower   Partial weight bearing   Complete by: As directed    % Body Weight: 50%   Laterality: left   Extremity: Lower   Partial weight bearing   Complete by: As directed    % Body Weight: 50%   Laterality: left   Extremity: Lower   Patient may shower   Complete by: As directed    You may shower over your dressing   Patient may shower   Complete by: As directed    You may shower over your dressing   Patient may shower   Complete by: As directed    You may shower over your dressing   TED hose   Complete by: As directed    Use stockings (TED hose) for 3 weeks on left  leg.  You may remove them at night for sleeping.   TED hose   Complete by: As directed    Use stockings (TED hose) for 3 weeks on left  leg.  You may remove them at night for sleeping.   TED hose   Complete by: As directed    Use stockings (TED hose) for 3 weeks on left  leg.  You may remove them at night for sleeping.      DISCHARGE MEDICATIONS:   Allergies as of 07/04/2020   No Known Allergies     Medication List    STOP taking these medications   amoxicillin 500 MG tablet Commonly known as: AMOXIL   ibuprofen 200 MG tablet Commonly known as: ADVIL   traMADol 50 MG tablet Commonly known as: ULTRAM     TAKE these medications   acetaminophen 325 MG tablet Commonly known as: Tylenol Take 2 tablets (650 mg total) by mouth every 6 (six) hours as needed.    aspirin EC 81 MG tablet Take 1 tablet (81 mg total) by mouth 2 (two) times daily.   clonazePAM 1 MG tablet Commonly known as: KLONOPIN Take 1 mg by mouth See admin instructions. Take 1 tablet (1 mg) by mouth scheduled every evening, may take an additional dose during the day if needed for anxiety.   gabapentin 100 MG capsule Commonly known as: NEURONTIN Take 1 capsule (100 mg total) by mouth 3 (three) times daily.   lisinopril-hydrochlorothiazide 10-12.5 MG tablet Commonly known as: ZESTORETIC Start 1/2 tab PO qd, may increase to 1 PO qd if needed for BP control. What changed:   how much to take  how to take this  when to take this  additional instructions   QUEtiapine 25 MG tablet Commonly known as: SEROQUEL Take 25 mg by mouth at bedtime.   Viibryd 40 MG Tabs Generic drug: Vilazodone HCl Take 40 mg by mouth daily.            Discharge Care Instructions  (From admission, onward)         Start     Ordered   07/04/20 0000  Partial weight bearing       Question Answer Comment  % Body Weight 50%   Laterality left   Extremity Lower      07/04/20 1031   07/04/20 0000  Change dressing       Comments: DO NOT CHANGE YOUR DRESSING.   07/04/20 1031   07/04/20 0000  Partial weight bearing       Question Answer Comment  % Body Weight 50%   Laterality left   Extremity Lower      07/04/20 1036   07/04/20 0000  Change dressing       Comments: DO NOT CHANGE YOUR DRESSING.   07/04/20 1036   07/04/20 0000  Partial weight bearing       Question Answer Comment  % Body Weight 50%   Laterality left   Extremity Lower      07/04/20 1043   07/04/20 0000  Change dressing       Comments: DO NOT CHANGE YOUR DRESSING.   07/04/20 1043          FOLLOW UP VISIT:    Follow-up Information    Valeria Batman, MD In 2 weeks.   Specialty: Orthopedic Surgery Contact information: [address] [place] [address] [phone]               DISPOSITION:    Home  CONDITION:  Stable   Oris Drone. Aleda Grana [facility] [phone]  07/04/2020 11:06 AM

## 2020-07-04 NOTE — H&P (Signed)
The recent History & Physical has been reviewed. I have personally examined the patient today. There is no interval change to the documented History & Physical. The patient would like to proceed with the procedure.  [name] Julia [name] 07/04/2020,  7:13 AM    

## 2020-07-04 NOTE — Anesthesia Procedure Notes (Signed)
Procedure Name: MAC Date/Time: 07/04/2020 7:38 AM Performed by: Maxwell Caul, CRNA Pre-anesthesia Checklist: Patient identified, Emergency Drugs available, Suction available and Patient being monitored Patient Re-evaluated:Patient Re-evaluated prior to induction Oxygen Delivery Method: Simple face mask

## 2020-07-04 NOTE — Evaluation (Signed)
Physical Therapy Evaluation Patient Details Name: Julia [name] MRN: [id number] DOB: 1965/11/30 Today's Date: 07/04/2020   History of Present Illness  Patient is 54 y.o. female s/p Lt TKA with PMH significant for HTN, OA, depression, anxiety, Rt TKA on 12/14/19.  Clinical Impression  Julia [name] is a 54 y.o. female POD 0 s/p Lt TKA. Patient reports independence with mobility at baseline. Patient is now limited by functional impairments (see PT problem list below) and requires min guard/supervision for transfers and gait with RW. Patient was able to ambulate ~100 feet with RW and min guard/supervision and cues for safe walker management. Patient educated on safe sequencing for stair mobility and verbalized safe guarding position for people assisting with mobility. Patient's spouse present for session and provided safe guarding during gait and stair mobility with instruction from therapist. Patient instructed in exercises to facilitate ROM and circulation. Patient will benefit from continued skilled PT interventions to address impairments and progress towards PLOF. Patient has met mobility goals at adequate level for discharge home; will continue to follow if pt continues acute stay to progress towards Mod I goals.     Follow Up Recommendations Follow surgeon's recommendation for DC plan and follow-up therapies;Outpatient PT    Equipment Recommendations  None recommended by PT    Recommendations for Other Services       Precautions / Restrictions Precautions Precautions: Fall Restrictions Weight Bearing Restrictions: No Other Position/Activity Restrictions: WBAT      Mobility  Bed Mobility               General bed mobility comments: pt OOB in recliner.    Transfers Overall transfer level: Needs assistance Equipment used: Rolling walker (2 wheeled) Transfers: Sit to/from Stand Sit to Stand: Min guard         General transfer comment: close min gaurd for power up from  low recliner, pt using bil UE's on armrests to rise.  Ambulation/Gait Ambulation/Gait assistance: Min guard;Supervision Gait Distance (Feet): 100 Feet Assistive device: Rolling walker (2 wheeled) Gait Pattern/deviations: Step-to pattern;Decreased stride length;Decreased weight shift to left Gait velocity: decr   General Gait Details: VC's for step pattern and proximity to RW, min guard and no buckling at Lt knee noted. progressed to supervision and pt's spouse present providing safe guarding of pt with instruction from therapist.  Stairs Stairs: Yes Stairs assistance: Min guard;Supervision Stair Management: One rail Left;Step to pattern;Sideways Number of Stairs: 4 (2x2) General stair comments: Cues for safe step pattern "up with good, down with bad". cues for safe guarding pattern and pt's spouse providing guarding on second bout of stairs.  Wheelchair Mobility    Modified Rankin (Stroke Patients Only)       Balance Overall balance assessment: Needs assistance Sitting-balance support: Feet supported Sitting balance-Leahy Scale: Good     Standing balance support: During functional activity;Bilateral upper extremity supported Standing balance-Leahy Scale: Fair                               Pertinent Vitals/Pain Pain Assessment: 0-10 Pain Score: 5  Pain Location: Lt knee Pain Descriptors / Indicators: Discomfort;Aching Pain Intervention(s): Limited activity within patient's tolerance;Monitored during session;Repositioned;Premedicated before session    Home Living Family/patient expects to be discharged to:: Private residence Living Arrangements: Spouse/significant other Available Help at Discharge: Family Type of Home: House Home Access: Stairs to enter Entrance Stairs-Rails: Left Entrance Stairs-Number of Steps: 3 Home Layout: Two level;Full bath  on main level;Able to live on main level with bedroom/bathroom;Bed/bath upstairs Home Equipment: Walker - 2  wheels;Shower seat;Bedside commode      Prior Function Level of Independence: Independent               Hand Dominance   Dominant Hand: Left    Extremity/Trunk Assessment   Upper Extremity Assessment Upper Extremity Assessment: Overall WFL for tasks assessed    Lower Extremity Assessment Lower Extremity Assessment: LLE deficits/detail LLE Deficits / Details: good quad activation, no extensor lag with SLR, 3+/5 with LAQ testing LLE Sensation: WNL LLE Coordination: WNL    Cervical / Trunk Assessment Cervical / Trunk Assessment: Normal  Communication   Communication: No difficulties  Cognition Arousal/Alertness: Awake/alert Behavior During Therapy: WFL for tasks assessed/performed Overall Cognitive Status: Within Functional Limits for tasks assessed                                        General Comments      Exercises Total Joint Exercises Ankle Circles/Pumps: AROM;Both;20 reps;Seated Quad Sets: AROM;Left;Other reps (comment);Seated (1-2) Short Arc Quad: AROM;Left;Other reps (comment);Seated (1-2) Heel Slides: AROM;Left;Other reps (comment);Seated (1-2) Hip ABduction/ADduction: AROM;Left;Other reps (comment);Seated (1-2) Straight Leg Raises: AROM;Left;Other reps (comment);Seated (1-2) Long Arc Quad: AROM;Left;Other reps (comment);Seated (1-2) Knee Flexion: AROM;AAROM;Left;Other reps (comment);Seated (1-2)   Assessment/Plan    PT Assessment Patient needs continued PT services  PT Problem List Decreased strength;Decreased range of motion;Decreased activity tolerance;Decreased balance;Decreased knowledge of use of DME;Decreased knowledge of precautions;Decreased mobility;Pain       PT Treatment Interventions DME instruction;Gait training;Stair training;Functional mobility training;Therapeutic activities;Therapeutic exercise;Balance training;Patient/family education    PT Goals (Current goals can be found in the Care Plan section)  Acute Rehab  PT Goals Patient Stated Goal: get hom today PT Goal Formulation: With patient/family Time For Goal Achievement:  (t+7) Potential to Achieve Goals: Good    Frequency 7X/week   Barriers to discharge        Co-evaluation               AM-PAC PT "6 Clicks" Mobility  Outcome Measure Help needed turning from your back to your side while in a flat bed without using bedrails?: A Little Help needed moving from lying on your back to sitting on the side of a flat bed without using bedrails?: A Little Help needed moving to and from a bed to a chair (including a wheelchair)?: A Little Help needed standing up from a chair using your arms (e.g., wheelchair or bedside chair)?: A Little Help needed to walk in hospital room?: A Little Help needed climbing 3-5 steps with a railing? : A Little 6 Click Score: 18    End of Session Equipment Utilized During Treatment: Gait belt Activity Tolerance: Patient tolerated treatment well Patient left: in chair;with call bell/phone within reach;with chair alarm set;with family/visitor present Nurse Communication: Mobility status PT Visit Diagnosis: Muscle weakness (generalized) (M62.81);Difficulty in walking, not elsewhere classified (R26.2)    Time: 8768-1157 PT Time Calculation (min) (ACUTE ONLY): 38 min   Charges:   PT Evaluation $PT Eval Low Complexity: 1 Low PT Treatments $Gait Training: 8-22 mins $Therapeutic Exercise: 8-22 mins        Verner Mould, DPT Acute Rehabilitation Services Office [phone] Pager [phone]    Jacques Navy 07/04/2020, 2:26 PM

## 2020-07-04 NOTE — Anesthesia Procedure Notes (Signed)
Spinal  Patient location during procedure: OR Start time: 07/04/2020 7:43 AM End time: 07/04/2020 7:48 AM Staffing Anesthesiologist: Bethena Midget, MD Preanesthetic Checklist Completed: patient identified, IV checked, site marked, risks and benefits discussed, surgical consent, monitors and equipment checked, pre-op evaluation and timeout performed Spinal Block Patient position: sitting Prep: DuraPrep Patient monitoring: heart rate, cardiac monitor, continuous pulse ox and blood pressure Approach: midline Location: L3-4 Injection technique: single-shot Needle Needle type: Sprotte  Needle gauge: 24 G Needle length: 9 cm Assessment Sensory level: T4

## 2020-07-04 NOTE — Op Note (Signed)
PATIENT ID:      MURRELL ELIZONDO  MRN:     [id number] DOB/AGE:    1966/02/26 / 54 y.o.       OPERATIVE REPORT    DATE OF PROCEDURE:  07/04/2020       PREOPERATIVE DIAGNOSIS: END STAGE  LEFT KNEE OSTEOARTHRITIS                                                       Estimated body mass index is 37.8 kg/m as calculated from the following:   Height as of 06/29/20: 5\' 9"  (1.753 m).   Weight as of this encounter: 116.1 kg.     POSTOPERATIVE DIAGNOSIS:END STAGE   LEFT KNEE OSTEOARTHRITIS                                                                     Estimated body mass index is 37.8 kg/m as calculated from the following:   Height as of 06/29/20: 5\' 9"  (1.753 m).   Weight as of this encounter: 116.1 kg.     PROCEDURE:  Procedure(s): LEFT TOTAL KNEE ARTHROPLASTY      SURGEON:  07/01/20, MD    ASSISTANT:   , PA-C   (Present and scrubbed throughout the case, critical for assistance with exposure, retraction, instrumentation, and closure.)          ANESTHESIA: regional, spinal and IV sedation     DRAINS: none :      TOURNIQUET TIME:  Total Tourniquet Time Documented: Thigh (Left) - 65 minutes Total: Thigh (Left) - 65 minutes     COMPLICATIONS:  None   CONDITION:  stable  PROCEDURE IN DETAIL: [name]   [name] 07/04/2020, 9:35 AM

## 2020-07-05 ENCOUNTER — Other Ambulatory Visit: Payer: Self-pay | Admitting: Orthopaedic Surgery

## 2020-07-05 ENCOUNTER — Encounter (HOSPITAL_COMMUNITY): Payer: Self-pay | Admitting: Orthopaedic Surgery

## 2020-07-05 ENCOUNTER — Other Ambulatory Visit: Payer: Self-pay | Admitting: Radiology

## 2020-07-05 ENCOUNTER — Encounter: Payer: Self-pay | Admitting: Orthopaedic Surgery

## 2020-07-05 DIAGNOSIS — Z96652 Presence of left artificial knee joint: Secondary | ICD-10-CM

## 2020-07-05 LAB — URINE CULTURE: Culture: NO GROWTH

## 2020-07-05 MED ORDER — OXYCODONE HCL ER 10 MG PO T12A: 10.0000 mg | EXTENDED_RELEASE_TABLET | Freq: Two times a day (BID) | ORAL | 0 refills | Status: DC

## 2020-07-05 MED ORDER — OXYCODONE HCL ER 10 MG PO T12A: 10.0000 mg | EXTENDED_RELEASE_TABLET | Freq: Two times a day (BID) | ORAL | Status: DC

## 2020-07-05 NOTE — Telephone Encounter (Signed)
Sent in-please call pt

## 2020-07-05 NOTE — Op Note (Signed)
NAMEKRISTALYNN, [name] MEDICAL RECORD TD:[id number] ACCOUNT [id number] DATE OF BIRTH:09/14/1965 FACILITY: [facility] LOCATION: [facility] PHYSICIAN:Salayah Meares Sharlotte Alamo, MD  OPERATIVE REPORT  DATE OF PROCEDURE:  07/04/2020  PREOPERATIVE DIAGNOSIS:  End-stage osteoarthritis, left knee.  POSTOPERATIVE DIAGNOSIS:  End-stage osteoarthritis, left knee.  PROCEDURE:  Left total knee replacement.  SURGEON:  Joni Fears, MD  ASSISTANT:  Biagio Borg, PA-C.  ANESTHESIA:  Spinal with adductor canal block and IV sedation.  COMPLICATIONS:  None.  COMPONENTS:  DePuy LCS standard plus femoral component, a #4 rotating keeled tibial tray with a 12.5 mm polyethylene bridging bearing.  All components were secured with polymethyl methacrylate.  DESCRIPTION OF PROCEDURE:  The patient was met in the holding area and identified the left knee as the appropriate operative site and marked it accordingly.  Any questions were answered.  The patient was then transferred to room #6.  Anesthesia performed spinal anesthetic without difficulty.  She did receive a preoperative and adductor canal block per anesthesia in the holding area.  With IV sedation she was then carefully placed in the supine position.  Nursing staff inserted a Foley catheter.  Urine was clear.  A left thigh tourniquet was applied.  Left lower extremity was then prepared with chlorhexidine scrub and DuraPrep x2.  Sterile draping was performed.  A timeout was called.  The left lower extremity was then elevated in a proximal tourniquet elevated to 325 mmHg.  A midline longitudinal incision was then made centered about the patella extending from the superior pouch to the tibial tubercle.  Via sharp dissection, incision was carried down to subcutaneous tissue.  The first layer of capsule was incised in the  midline.  A medial parapatellar incision was then made with the Bovie.  The knee joint was then entered.  There was a clear yellow joint  effusion.  Patella was everted 180 degrees laterally and the knee flexed to 90 degrees.  There was abundant beefy red synovitis.  Synovectomy was performed.  Synovial specimen was sent to the lab after her initial right knee replacement six months ago consistent with inflammatory synovitis, but not specific so we did not perform another  biopsy.  Osteophytes were removed from the femur, both medially and laterally and I measured a standard plus femoral component.  The knee flexed to 90 degrees, the first bony cut was then made transversely on the tibia with a 7-degree angle of declination.  With  each bony cut we checked our alignment with the external guide.  Femoral cuts were then made with the standard plus femoral jig.  Laminar spreader was inserted along the medial and lateral compartments.  Medial and lateral meniscectomy was performed as  well as ACL and PCL resection.  Osteophytes were removed from the posterior femoral condyle with a 3/4 inch curved osteotome.  MCL and LCL remained intact.  Flexion gap was 12.5 mm, nice and stable.  Subsequent cuts were then made on the femur using the 4-degree valgus jig.  We checked our extension gap and it was also symmetrical at 12.5 mm.  The final jig was then applied to the femur to obtain the tapered cuts in the center hole.  Retractors were then placed around the tibia, was advanced anteriorly.  We measured a #4 tray.  This was pinned in place.  The center hole was then made followed by the keeled cut.  With the tibial jig in place, the 12.5 mm polyethylene bridging trial  bearing was then inserted followed by the standard  plus femoral component.  The entire construct was reduced.  We had full extension and no opening with varus or valgus stress in flexion.  The components were stable.  Patella was prepared by removing approximately 7 mm of bone, leaving about 12 mm of patella thickness.  The trial components were then removed.  The joint was  copiously irrigated with saline solution.  The final components were impacted with polymethyl methacrylate.  We initially applied the #4 tibial tray followed by the 12.5 mm polyethylene bridging  bearing in the standard plus femoral component.  Components were impacted and extraneous methacrylate removed from the periphery of the components.  With the knee in extension, the patella was then applied with the patellar clamp and methacrylate.  Extraneous methacrylate was removed from its periphery.  At approximately 16 minutes, the methacrylate had matured.  During this time, we injected the  joint and capsule with 0.25% Marcaine with epinephrine.  Further synovectomy was performed and we irrigated the joint.  With maturation of the methacrylate, the tourniquet was then released about 65 minutes.  Any bleeding was controlled with bone wax  about the bone and the Bovie within the soft tissue.  We thought we had a nice dry field.  The deep capsule was then closed with a running #1 Ethibond, superficial capsule with a 2-0 Vicryl, the subcutaneous with 3-0 Monocryl, skin closed with skin clips.  Sterile bulky dressing was applied i.e., Aquacel followed by the patient's support  stocking.  The patient tolerated the procedure without complications.  HN/NUANCE  D:07/04/2020 T:07/05/2020 JOB:[id number]

## 2020-07-09 ENCOUNTER — Encounter: Payer: Self-pay | Admitting: Orthopaedic Surgery

## 2020-07-10 ENCOUNTER — Other Ambulatory Visit: Payer: Self-pay | Admitting: Orthopaedic Surgery

## 2020-07-10 DIAGNOSIS — Z96653 Presence of artificial knee joint, bilateral: Secondary | ICD-10-CM

## 2020-07-10 MED ORDER — OXYCODONE-ACETAMINOPHEN 5-325 MG PO TABS: 1.0000 | ORAL_TABLET | ORAL | 0 refills | Status: AC | PRN

## 2020-07-10 MED ORDER — OXYCODONE HCL 5 MG PO TABS: 5.0000 mg | ORAL_TABLET | Freq: Four times a day (QID) | ORAL | Status: DC | PRN

## 2020-07-10 NOTE — Telephone Encounter (Signed)
Sent via [facility]

## 2020-07-11 ENCOUNTER — Encounter: Payer: Self-pay | Admitting: Family Medicine

## 2020-07-11 MED ORDER — SULFAMETHOXAZOLE-TRIMETHOPRIM 800-160 MG PO TABS: ORAL_TABLET | ORAL | 0 refills | Status: DC

## 2020-07-12 ENCOUNTER — Other Ambulatory Visit: Payer: Self-pay | Admitting: Orthopaedic Surgery

## 2020-07-12 MED ORDER — OXYCODONE HCL ER 10 MG PO T12A: 10.0000 mg | EXTENDED_RELEASE_TABLET | Freq: Two times a day (BID) | ORAL | 0 refills | Status: DC

## 2020-07-12 NOTE — Telephone Encounter (Signed)
Sent to [facility]

## 2020-07-13 ENCOUNTER — Other Ambulatory Visit: Payer: Self-pay

## 2020-07-13 ENCOUNTER — Other Ambulatory Visit: Payer: Self-pay | Admitting: Orthopedic Surgery

## 2020-07-13 ENCOUNTER — Encounter: Payer: Self-pay | Admitting: Physical Therapy

## 2020-07-13 ENCOUNTER — Ambulatory Visit: Payer: Managed Care, Other (non HMO) | Attending: Family Medicine | Admitting: Physical Therapy

## 2020-07-13 DIAGNOSIS — R2689 Other abnormalities of gait and mobility: Secondary | ICD-10-CM | POA: Insufficient documentation

## 2020-07-13 DIAGNOSIS — G8929 Other chronic pain: Secondary | ICD-10-CM | POA: Diagnosis present

## 2020-07-13 DIAGNOSIS — M25562 Pain in left knee: Secondary | ICD-10-CM | POA: Insufficient documentation

## 2020-07-13 DIAGNOSIS — M6281 Muscle weakness (generalized): Secondary | ICD-10-CM | POA: Insufficient documentation

## 2020-07-13 DIAGNOSIS — R600 Localized edema: Secondary | ICD-10-CM | POA: Diagnosis present

## 2020-07-13 MED ORDER — OXYCODONE HCL ER 10 MG PO T12A: 10.0000 mg | EXTENDED_RELEASE_TABLET | Freq: Two times a day (BID) | ORAL | 0 refills | Status: DC

## 2020-07-13 NOTE — Telephone Encounter (Signed)
Please change location

## 2020-07-13 NOTE — Therapy (Addendum)
[facility] [address] [place], [place], [address] Phone: [phone]   Fax:  [phone]  Physical Therapy Evaluation  Patient Details  Name: Julia [name] MRN: [id number] Date of Birth: 25-Oct-1965 Referring Provider (PT): Norlene CampbellPeter Whitfield, MD   Encounter Date: 07/13/2020   PT End of Session - 07/13/20 1632    Visit Number 1    Number of Visits 25    Date for PT Re-Evaluation 10/05/20    Authorization Type [employer]    PT Start Time 1631    PT Stop Time 1715    PT Time Calculation (min) 44 min    Activity Tolerance Patient tolerated treatment well    Behavior [facility] Therapy [facility] for tasks assessed/performed           Past Medical History:  Diagnosis Date  . Anxiety   . Arthritis    knees, hands  . Depression   . Hypertension     Past Surgical History:  Procedure Laterality Date  . ABDOMINAL HYSTERECTOMY    . KNEE ARTHROSCOPY    . 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   Procedure: RIGHT TOTAL KNEE ARTHROPLASTY;  Surgeon: Valeria BatmanWhitfield, Peter W, MD;  Location: [facility];  Service: Orthopedics;  Laterality: Right;  . TOTAL KNEE ARTHROPLASTY Left 07/04/2020   Procedure: LEFT TOTAL KNEE ARTHROPLASTY;  Surgeon: Valeria BatmanWhitfield, Peter W, MD;  Location: [facility];  Service: Orthopedics;  Laterality: Left;    There were no vitals filed for this visit.    Subjective Assessment - 07/13/20 1639    Subjective pt is a 54 y.o s/p L TKA on 07/04/2020. since surgery she reports she is doing pretty good. she hasn't had any HHPT ompared to when she has her R TKA. pain stays mostly arounf the knee and continues to use RW at home. pt reports pain stays all over the knee.    Pertinent History hx of RTKA    Limitations Lifting;Standing;Walking;Sitting    How long can you sit comfortably? 30 min    How long can you stand comfortably? 30 min    How long can you walk comfortably? with RW    Patient Stated Goals to have no pain, to get back to regualr  motion, get life back to normal    Currently in Pain? Yes    Pain Score 10-Worst pain ever   last took meds for pain at 4pm   Pain Location Knee    Pain Orientation Left    Pain Descriptors / Indicators Tightness;Aching    Pain Type Surgical pain    Pain Onset More than a month ago    Pain Frequency Constant    Aggravating Factors  lifting the leg, laying straight for long periods    Pain Relieving Factors ice, medication    Effect of Pain on Daily Activities limited standing/ walking and positional tolerance.              [facility] - 07/13/20 0001      Assessment   Medical Diagnosis Rt TKA    Referring Provider (PT) Norlene CampbellPeter Whitfield, MD    Onset Date/Surgical Date 07/04/20    Hand Dominance Right    Next MD Visit 07/19/2020    Prior Therapy for R TKA      Precautions   Precautions None      Restrictions   Weight Bearing Restrictions No      Balance Screen   Has the patient fallen in the past 6 months  No      Prior Function   Level of Independence Independent      Cognition   Overall Cognitive Status Within Functional Limits for tasks assessed      Observation/Other Assessments   Observations Patient appears in no apparent distress    Skin Integrity incision is covered with bandage, unable to assess healing    Focus on Therapeutic Outcomes (FOTO)  NA - not set up      Observation/Other Assessments-Edema    Edema Circumferential      Circumferential Edema   Circumferential - Right 49 cm   at joint line   Circumferential - Left  51.5   at joint line     Posture/Postural Control   Posture/Postural Control Postural limitations    Postural Limitations Rounded Shoulders;Forward head      ROM / Strength   AROM / PROM / Strength AROM;PROM;Strength      AROM   AROM Assessment Site Knee    Right/Left Knee Right;Left    Right Knee Extension 2    Right Knee Flexion 103    Left Knee Extension 15    Left Knee Flexion 73      PROM   Overall PROM  Due to  pain;Unable to assess      Strength   Overall Strength Unable to assess;Due to pain   high level of irritability   Strength Assessment Site Knee    Right/Left Knee Right;Left      Palpation   Palpation comment TTP peri-patellar and along the posterior knee      Ambulation/Gait   Ambulation/Gait Yes    Assistive device Rolling walker    Gait Pattern Step-to pattern;Trendelenburg;Antalgic                      Objective measurements completed on examination: See above findings.       [facility] Adult PT Treatment/Exercise - 07/13/20 0001      Vasopneumatic   Number Minutes Vasopneumatic  10 minutes    Vasopnuematic Location  Knee    Vasopneumatic Pressure Low    Vasopneumatic Temperature  34                  PT Education - 07/13/20 1632    Education Details evaluation findings, POC, goals, HEp with Proper form/ rationale. FOTO assessment.    Person(s) Educated Patient    Methods Explanation;Verbal cues;Handout    Comprehension Verbalized understanding;Verbal cues required            PT Short Term Goals - 07/13/20 1729      PT SHORT TERM GOAL #1   Title pt to be IND with inital HEP    Time 4    Period Weeks    Status New    Target Date 08/10/20      PT SHORT TERM GOAL #2   Title pt to verbalize/ demo techinques to reduce pain and inflammation via RICE and HEP    Time 4    Period Weeks    Status New    Target Date 08/10/20      PT SHORT TERM GOAL #3   Title pt to increase knee flexion to >/= 90 degrees for fucntional progression    Time 4    Period Weeks    Status New    Target Date 08/10/20             PT Long Term Goals - 07/13/20 1730  PT LONG TERM GOAL #1   Title pt to increase total arc knee ROM >/= 5 - 110 degrees for functional knee mobility required for ADLs    Time 12    Period Weeks    Status New    Target Date 10/05/20      PT LONG TERM GOAL #2   Title pt to increase LLE strength to >/= 4+/5 to promote knee/ hip  stability with walking/ standing    Time 12    Period Weeks    Status New    Target Date 10/05/20      PT LONG TERM GOAL #3   Title pt to be able to stand and walk for >/= 1 hour with LRAD with no report limitations and max pain </= 2/10 for functonal endurance for in home and community amb    Time 12    Period Weeks    Status New    Target Date 10/05/20      PT LONG TERM GOAL #4   Title pt to be IND with all HEP given and is able to maintain and progress current LOF    Time 12    Period Weeks    Status New    Target Date 10/05/20                  Plan - 07/13/20 1723    Clinical Impression Statement pt is a pleasant lady presenting to OPPT s/p L TKA on 07/04/2020. She demonstates limited knee ROM secondary to pain and guarding and localized edema with total arc AROM being 15 - 73 degress. unable to assess PROM and strength due to pt high pain level an irritability. She currently amb with RW utilizing step to pattern, with limited stance on the LLE. she would benefit from physical therapy to decrease L knee pain, improve ROM and strnegth, maximzie gait efficiency and overall function by addressing the deficits listed.    Personal Factors and Comorbidities Comorbidity 3+    Comorbidities PMH: Rt TKA 12/14/19, L TKA on 07/04/2020 anx, dep, OA    Examination-Activity Limitations Stairs;Squat;Bend;Stand;Lift;Locomotion Level    Stability/Clinical Decision Making Evolving/Moderate complexity    Clinical Decision Making Moderate    Rehab Potential Good    PT Frequency 2x / week    PT Duration 12 weeks    PT Treatment/Interventions Aquatic Therapy;Cryotherapy;Electrical Stimulation;Iontophoresis 4mg /ml Dexamethasone;Moist Heat;Ultrasound;Gait training;Stair training;Functional mobility training;Therapeutic activities;Therapeutic exercise;Balance training;Neuromuscular re-education;Manual techniques;Passive range of motion;Dry needling;Scar mobilization;Joint Manipulations;Vasopneumatic  Device;Taping    PT Next Visit Plan review/ update HEP PRN, high sensitivity / irritability - knee ROM, gross strengthening modalities. Let pt know if she [employer] doesn't cover VASO and she may get a billed for use.    PT Home Exercise Plan [id number] - quad set, hamstring stretch ( supine and seated), heel slide (supine with strap and seated), SAQ    Consulted and Agree with Plan of Care Patient           Patient will benefit from skilled therapeutic intervention in order to improve the following deficits and impairments:  Abnormal gait,Decreased activity tolerance,Decreased balance,Decreased endurance,Decreased mobility,Decreased range of motion,Decreased strength,Difficulty walking,Hypomobility,Increased edema,Increased fascial restricitons,Pain  Visit Diagnosis: Chronic pain of left knee - Plan: PT plan of care cert/re-cert  Muscle weakness (generalized) - Plan: PT plan of care cert/re-cert  Other abnormalities of gait and mobility - Plan: PT plan of care cert/re-cert  Localized edema - Plan: PT plan of care cert/re-cert     Problem List  Patient Active Problem List   Diagnosis Date Noted  . Total knee replacement status, right 12/14/2019  . Class 2 obesity due to excess calories without serious comorbidity with body mass index (BMI) of 39.0 to 39.9 in adult 11/05/2019  . Unilateral primary osteoarthritis, right knee 11/05/2019  . Unilateral primary osteoarthritis, left knee 11/05/2019  . Bilateral primary osteoarthritis of knee 01/25/2019   Lulu Riding PT, DPT, LAT, ATC  07/13/20  6:09 PM      [facility] [address] [place], [place], [address] Phone: [phone]   Fax:  [phone]  Name: MIU CHIONG MRN: [id number] Date of Birth: 1966/05/31

## 2020-07-13 NOTE — Telephone Encounter (Signed)
Sent RX to [facility](checked with them and they have Oxtcontin 10mg  and called [facility] and told her that I ordered it and she can pick it up

## 2020-07-13 NOTE — Telephone Encounter (Signed)
thanks

## 2020-07-17 ENCOUNTER — Other Ambulatory Visit: Payer: Self-pay | Admitting: Orthopaedic Surgery

## 2020-07-17 NOTE — Telephone Encounter (Signed)
Spoke with patient. She has 4tablets left. I explained that Dr.Whitfield is not willing to prescribe any additional narcotics until she follows up with him to discuss on Wednesday at 1:15pm.

## 2020-07-17 NOTE — Telephone Encounter (Signed)
According to the chart has had 90 oxycodone since surgery 2 weeks ago plus oxycontin-needs to wait until office return this week

## 2020-07-18 ENCOUNTER — Other Ambulatory Visit: Payer: Self-pay | Admitting: Orthopaedic Surgery

## 2020-07-18 ENCOUNTER — Inpatient Hospital Stay: Payer: Managed Care, Other (non HMO) | Admitting: Orthopaedic Surgery

## 2020-07-18 MED ORDER — OXYCODONE-ACETAMINOPHEN 5-325 MG PO TABS: 1.0000 | ORAL_TABLET | Freq: Three times a day (TID) | ORAL | 0 refills | Status: DC | PRN

## 2020-07-18 NOTE — Telephone Encounter (Signed)
Rx sent to pharmacy   

## 2020-07-18 NOTE — Telephone Encounter (Signed)
Called patient and made her aware.

## 2020-07-19 ENCOUNTER — Ambulatory Visit (INDEPENDENT_AMBULATORY_CARE_PROVIDER_SITE_OTHER): Payer: Managed Care, Other (non HMO) | Admitting: Orthopaedic Surgery

## 2020-07-19 ENCOUNTER — Inpatient Hospital Stay: Payer: Managed Care, Other (non HMO) | Admitting: Orthopaedic Surgery

## 2020-07-19 ENCOUNTER — Ambulatory Visit (INDEPENDENT_AMBULATORY_CARE_PROVIDER_SITE_OTHER): Payer: Managed Care, Other (non HMO)

## 2020-07-19 ENCOUNTER — Other Ambulatory Visit: Payer: Self-pay

## 2020-07-19 ENCOUNTER — Encounter: Payer: Self-pay | Admitting: Orthopaedic Surgery

## 2020-07-19 VITALS — Ht 69.0 in | Wt 256.0 lb

## 2020-07-19 DIAGNOSIS — Z96652 Presence of left artificial knee joint: Secondary | ICD-10-CM | POA: Diagnosis not present

## 2020-07-19 DIAGNOSIS — M1712 Unilateral primary osteoarthritis, left knee: Secondary | ICD-10-CM

## 2020-07-19 MED ORDER — METHOCARBAMOL 500 MG PO TABS: ORAL_TABLET | ORAL | 0 refills | Status: DC

## 2020-07-19 NOTE — Addendum Note (Signed)
Addended by: Wendi Maya on: 07/19/2020 01:49 PM   Modules accepted: Orders

## 2020-07-19 NOTE — Progress Notes (Signed)
Office Visit Note   Patient: Julia [name]           Date of Birth: April 05, 1966           MRN: [id number] Visit Date: 07/19/2020              Requested by: Lavada Mesi, MD [address] [place],  [place] [address] PCP: Lavada Mesi, MD   Assessment & Plan: Visit Diagnoses:  1. History of left knee replacement   2. Unilateral primary osteoarthritis, left knee   3. Status post total knee replacement, left     Plan: 2 weeks status post primary left total knee replacement doing well.  No fever chills or shortness of breath.  Wound is healing nicely.  I removed the clips and applied Steri-Strips.  Has 90 degrees of flexion and just about full extension without instability.  We will stop the OxyContin and just take oxycodone for pain.  Robaxin as a muscle relaxant intake 1 baby aspirin twice a day.  Return in 2 weeks  Follow-Up Instructions: Return in about 2 weeks (around 08/02/2020).   Orders:  Orders Placed This Encounter  Procedures  . XR KNEE 3 VIEW LEFT   No orders of the defined types were placed in this encounter.     Procedures: No procedures performed   Clinical Data: No additional findings.   Subjective: Chief Complaint  Patient presents with  . Left Knee - Follow-up    Left total knee arthroplasty 07/04/2020  Patient presents today for follow up on her left knee. She had a left total knee arthroplasty on 07/04/2020. She is now two weeks out from surgery. She has been to outpatient therapy a couple times now. She is doing well. She is walking with the assistance of a walker. She is taking oxycontin for pain management.   HPI  Review of Systems   Objective: Vital Signs: Ht 5\' 9"  (1.753 m)   Wt 256 lb (116.1 kg)   BMI 37.80 kg/m   Physical Exam  Ortho Exam awake alert and oriented x3.  Comfortable sitting.  Ambulates with the use of a walker.  Left knee incision healing without problem.  Clips were removed and Steri-Strips applied.  No calf pain.  No  evidence of instability.  Passively I could obtain full extension and she flexed about 90 degrees actively.  No distal edema.  Neurologically intact  Specialty Comments:  No specialty comments available.  Imaging: No results found.   PMFS History: Patient Active Problem List   Diagnosis Date Noted  . Status post total knee replacement, left 07/19/2020  . Total knee replacement status, right 12/14/2019  . Class 2 obesity due to excess calories without serious comorbidity with body mass index (BMI) of 39.0 to 39.9 in adult 11/05/2019  . Unilateral primary osteoarthritis, right knee 11/05/2019  . Unilateral primary osteoarthritis, left knee 11/05/2019  . Bilateral primary osteoarthritis of knee 01/25/2019   Past Medical History:  Diagnosis Date  . Anxiety   . Arthritis    knees, hands  . Depression   . Hypertension     Family History  Problem Relation Age of Onset  . Cancer Mother     Past Surgical History:  Procedure Laterality Date  . ABDOMINAL HYSTERECTOMY    . KNEE ARTHROSCOPY    . 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   Procedure: RIGHT TOTAL KNEE ARTHROPLASTY;  Surgeon: 02/13/2020, MD;  Location: [facility];  Service: Orthopedics;  Laterality: Right;  .  TOTAL KNEE ARTHROPLASTY Left 07/04/2020   Procedure: LEFT TOTAL KNEE ARTHROPLASTY;  Surgeon: Valeria Batman, MD;  Location: [facility];  Service: Orthopedics;  Laterality: Left;   Social History   Occupational History  . Not on file  Tobacco Use  . Smoking status: Former Smoker    Packs/day: 1.00    Years: 15.00    Pack years: 15.00    Types: Cigarettes    Quit date: 12/02/2012    Years since quitting: 7.6  . Smokeless tobacco: Never Used  Vaping Use  . Vaping Use: Former  Substance and Sexual Activity  . Alcohol use: Not Currently  . Drug use: Never  . Sexual activity: Yes

## 2020-07-21 ENCOUNTER — Ambulatory Visit: Payer: Managed Care, Other (non HMO) | Attending: Family Medicine

## 2020-07-21 ENCOUNTER — Other Ambulatory Visit: Payer: Self-pay

## 2020-07-21 DIAGNOSIS — R2689 Other abnormalities of gait and mobility: Secondary | ICD-10-CM | POA: Insufficient documentation

## 2020-07-21 DIAGNOSIS — M25561 Pain in right knee: Secondary | ICD-10-CM | POA: Insufficient documentation

## 2020-07-21 DIAGNOSIS — M25562 Pain in left knee: Secondary | ICD-10-CM | POA: Diagnosis present

## 2020-07-21 DIAGNOSIS — G8929 Other chronic pain: Secondary | ICD-10-CM | POA: Insufficient documentation

## 2020-07-21 DIAGNOSIS — M6281 Muscle weakness (generalized): Secondary | ICD-10-CM | POA: Diagnosis present

## 2020-07-21 DIAGNOSIS — M25661 Stiffness of right knee, not elsewhere classified: Secondary | ICD-10-CM | POA: Diagnosis present

## 2020-07-21 DIAGNOSIS — R600 Localized edema: Secondary | ICD-10-CM | POA: Insufficient documentation

## 2020-07-21 NOTE — Therapy (Addendum)
[facility] Outpatient Rehabilitation [facility] [address] [place], [place], [address] Phone: [phone]   Fax:  [phone]  Physical Therapy Treatment  Patient Details  Name: AYANNI TUN MRN: [id number] Date of Birth: 1966/06/15 Referring Provider (PT): [name], MD   Encounter Date: 07/21/2020   PT End of Session - 07/21/20 0939    Visit Number 2    Number of Visits 25    Date for PT Re-Evaluation 10/05/20    Authorization Type [employer]    PT Start Time 0932    PT Stop Time 1025    PT Time Calculation (min) 53 min    Activity Tolerance Patient tolerated treatment well    Behavior During Therapy [facility] for tasks assessed/performed           Past Medical History:  Diagnosis Date  . Anxiety   . Arthritis    knees, hands  . Depression   . Hypertension     Past Surgical History:  Procedure Laterality Date  . ABDOMINAL HYSTERECTOMY    . KNEE ARTHROSCOPY    . 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   Procedure: RIGHT TOTAL KNEE ARTHROPLASTY;  Surgeon: Valeria Batman, MD;  Location: [facility];  Service: Orthopedics;  Laterality: Right;  . TOTAL KNEE ARTHROPLASTY Left 07/04/2020   Procedure: LEFT TOTAL KNEE ARTHROPLASTY;  Surgeon: Valeria Batman, MD;  Location: [facility];  Service: Orthopedics;  Laterality: Left;    There were no vitals filed for this visit.   Subjective Assessment - 07/21/20 0937    Subjective Pt reports her knee feels really tight. She has been doing her ankle pumps, slides, resting on coffee table with towel under heel, attempting to walk with walker because she feels it helps with tightness as there is only so much pain meds can do.    Pertinent History hx of RTKA    Limitations Lifting;Standing;Walking;Sitting    How long can you sit comfortably? 30 min    How long can you stand comfortably? 30 min    How long can you walk comfortably? with RW    Patient Stated Goals to have no pain, to get back to regualr motion, get life  back to normal    Currently in Pain? Yes    Pain Score 7    took pain meds ta 7am   Pain Location Knee    Pain Orientation Left    Pain Descriptors / Indicators Aching;Tightness    Pain Type Surgical pain    Pain Onset More than a month ago              [facility] PT Assessment - 07/21/20 0001      Circumferential Edema   Circumferential - Left  51.5   at joint line     AROM   Left Knee Extension 8   10 post manual, 8 post quad sets   Left Knee Flexion 82   started with 75                        [facility] Adult PT Treatment/Exercise - 07/21/20 0001      Ambulation/Gait   Assistive device Rolling walker    Gait Pattern Step-to pattern;Trendelenburg;Antalgic      Self-Care   Self-Care RICE    RICE Elevating L LE during APs and ice      Knee/Hip Exercises: Stretches   Hip Flexor Stretch 60 seconds    Hip Flexor Stretch Limitations supine edge  of table      Knee/Hip Exercises: Seated   Heel Slides AROM;Left;1 set;5 reps    Heel Slides Limitations to knee ext with self OP      Knee/Hip Exercises: Supine   Quad Sets 1 set;10 reps    Quad Sets Limitations 5"    Heel Slides Left;1 set;10 reps    Heel Slides Limitations actively pushing down into heel and pulling      Modalities   Modalities Cryotherapy      Cryotherapy   Number Minutes Cryotherapy 10 Minutes    Cryotherapy Location Knee      Manual Therapy   Manual Therapy Edema management;Joint mobilization;Passive ROM    Edema Management retrograde effleurage to knee infra to suprapatellar    Joint Mobilization multidirectional patellra mobs, grade 3 ext mobs with femoral IR and tibial ER    Passive ROM knee flex/ext                  PT Education - 07/21/20 1402    Education Details elevate L LE more frequently, during ice and perform APs with elevation    Person(s) Educated Patient    Methods Explanation;Demonstration    Comprehension Verbalized understanding            PT Short Term  Goals - 07/13/20 1729      PT SHORT TERM GOAL #1   Title pt to be IND with inital HEP    Time 4    Period Weeks    Status New    Target Date 08/10/20      PT SHORT TERM GOAL #2   Title pt to verbalize/ demo techinques to reduce pain and inflammation via RICE and HEP    Time 4    Period Weeks    Status New    Target Date 08/10/20      PT SHORT TERM GOAL #3   Title pt to increase knee flexion to >/= 90 degrees for fucntional progression    Time 4    Period Weeks    Status New    Target Date 08/10/20             PT Long Term Goals - 07/13/20 1730      PT LONG TERM GOAL #1   Title pt to increase total arc knee ROM >/= 5 - 110 degrees for functional knee mobility required for ADLs    Time 12    Period Weeks    Status New    Target Date 10/05/20      PT LONG TERM GOAL #2   Title pt to increase LLE strength to >/= 4+/5 to promote knee/ hip stability with walking/ standing    Time 12    Period Weeks    Status New    Target Date 10/05/20      PT LONG TERM GOAL #3   Title pt to be able to stand and walk for >/= 1 hour with LRAD with no report limitations and max pain </= 2/10 for functonal endurance for in home and community amb    Time 12    Period Weeks    Status New    Target Date 10/05/20      PT LONG TERM GOAL #4   Title pt to be IND with all HEP given and is able to maintain and progress current LOF    Time 12    Period Weeks    Status New    Target  Date 10/05/20                 Plan - 07/21/20 0939    Clinical Impression Statement Pt presents with RW, continued step to gait pattern with minimal hip flexion and nearly no left knee flexion during ambulation. She tolerated tx well with less pain today than upon initial evaluation. Her AROM improved to 8-82 post manual therapy and therapeutic exercise. Pt's swelling remains unchanged, and pt reports she has not been elevating. Encouraged pt to elevate throughout the day, especially when icing and  performing APs.    Personal Factors and Comorbidities Comorbidity 3+    Comorbidities PMH: Rt TKA 12/14/19, L TKA on 07/04/2020 anx, dep, OA    Examination-Activity Limitations Stairs;Squat;Bend;Stand;Lift;Locomotion Level    Stability/Clinical Decision Making Evolving/Moderate complexity    Rehab Potential Good    PT Frequency 2x / week    PT Duration 12 weeks    PT Treatment/Interventions Aquatic Therapy;Cryotherapy;Electrical Stimulation;Iontophoresis 4mg /ml Dexamethasone;Moist Heat;Ultrasound;Gait training;Stair training;Functional mobility training;Therapeutic activities;Therapeutic exercise;Balance training;Neuromuscular re-education;Manual techniques;Passive range of motion;Dry needling;Scar mobilization;Joint Manipulations;Vasopneumatic Device;Taping    PT Next Visit Plan review/ update HEP PRN, high sensitivity / irritability - knee ROM, gross strengthening modalities. Gait training with AD as appropriate.    PT Home Exercise Plan [email or web address] - quad set, hamstring stretch ( supine and seated), heel slide (supine with strap and seated), SAQ    Consulted and Agree with Plan of Care Patient           Patient will benefit from skilled therapeutic intervention in order to improve the following deficits and impairments:  Abnormal gait,Decreased activity tolerance,Decreased balance,Decreased endurance,Decreased mobility,Decreased range of motion,Decreased strength,Difficulty walking,Hypomobility,Increased edema,Increased fascial restricitons,Pain  Visit Diagnosis: Chronic pain of left knee  Muscle weakness (generalized)  Other abnormalities of gait and mobility  Localized edema     Problem List Patient Active Problem List   Diagnosis Date Noted  . Status post total knee replacement, left 07/19/2020  . Total knee replacement status, right 12/14/2019  . Class 2 obesity due to excess calories without serious comorbidity with body mass index (BMI) of 39.0 to 39.9 in adult 11/05/2019  .  Unilateral primary osteoarthritis, right knee 11/05/2019  . Unilateral primary osteoarthritis, left knee 11/05/2019  . Bilateral primary osteoarthritis of knee 01/25/2019    01/27/2019, PT, DPT 07/21/2020, 2:03 PM  [facility] [address] [place], [place], [place] Phone: [phone]   Fax:  [phone]  Name: ELLIN FITZGIBBONS MRN: Criss Alvine Date of Birth: 06-05-1966

## 2020-07-24 ENCOUNTER — Encounter: Payer: Self-pay | Admitting: Orthopaedic Surgery

## 2020-07-27 ENCOUNTER — Ambulatory Visit: Payer: Managed Care, Other (non HMO) | Admitting: Physical Therapy

## 2020-07-28 ENCOUNTER — Ambulatory Visit: Payer: Managed Care, Other (non HMO) | Admitting: Physical Therapy

## 2020-07-28 ENCOUNTER — Encounter: Payer: Self-pay | Admitting: Orthopaedic Surgery

## 2020-07-28 ENCOUNTER — Other Ambulatory Visit: Payer: Self-pay | Admitting: Orthopaedic Surgery

## 2020-07-28 ENCOUNTER — Encounter: Payer: Self-pay | Admitting: Physical Therapy

## 2020-07-28 ENCOUNTER — Other Ambulatory Visit: Payer: Self-pay

## 2020-07-28 DIAGNOSIS — G8929 Other chronic pain: Secondary | ICD-10-CM

## 2020-07-28 DIAGNOSIS — R2689 Other abnormalities of gait and mobility: Secondary | ICD-10-CM

## 2020-07-28 DIAGNOSIS — M6281 Muscle weakness (generalized): Secondary | ICD-10-CM

## 2020-07-28 DIAGNOSIS — R600 Localized edema: Secondary | ICD-10-CM

## 2020-07-28 DIAGNOSIS — M25562 Pain in left knee: Secondary | ICD-10-CM | POA: Diagnosis not present

## 2020-07-28 MED ORDER — OXYCODONE-ACETAMINOPHEN 5-325 MG PO TABS: 1.0000 | ORAL_TABLET | Freq: Three times a day (TID) | ORAL | 0 refills | Status: DC | PRN

## 2020-07-28 NOTE — Therapy (Signed)
[facility] Outpatient Rehabilitation [facility] [address] [place], [place], [address] Phone: [phone]   Fax:  [phone]  Physical Therapy Treatment  Patient Details  Name: Julia [name] MRN: [id number] Date of Birth: 1965-08-02 Referring Provider (PT): Norlene , MD   Encounter Date: 07/28/2020   PT End of Session - 07/28/20 1044    Visit Number 3    Number of Visits 25    Date for PT Re-Evaluation 10/05/20    Authorization Type [employer]    PT Start Time 1045    PT Stop Time 1140    PT Time Calculation (min) 55 min    Activity Tolerance Patient tolerated treatment well    Behavior During Therapy [facility] for tasks assessed/performed           Past Medical History:  Diagnosis Date  . Anxiety   . Arthritis    knees, hands  . Depression   . Hypertension     Past Surgical History:  Procedure Laterality Date  . ABDOMINAL HYSTERECTOMY    . KNEE ARTHROSCOPY    . 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   Procedure: RIGHT TOTAL KNEE ARTHROPLASTY;  Surgeon: Valeria Batman, MD;  Location: [facility];  Service: Orthopedics;  Laterality: Right;  . TOTAL KNEE ARTHROPLASTY Left 07/04/2020   Procedure: LEFT TOTAL KNEE ARTHROPLASTY;  Surgeon: Valeria Batman, MD;  Location: [facility];  Service: Orthopedics;  Laterality: Left;    There were no vitals filed for this visit.   Subjective Assessment - 07/28/20 1044    Patient Stated Goals to have no pain, to get back to regualr motion, get life back to normal    Currently in Pain? Yes    Pain Location Knee    Pain Orientation Left    Pain Descriptors / Indicators Aching;Tightness    Pain Type Surgical pain    Pain Onset More than a month ago    Pain Frequency Constant              [facility] PT Assessment - 07/28/20 0001      AROM   Left Knee Extension 8    Left Knee Flexion 97      PROM   Right Knee Flexion 100                         [facility] Adult PT Treatment/Exercise - 07/28/20 0001       Exercises   Exercises Knee/Hip      Knee/Hip Exercises: Stretches   Passive Hamstring Stretch 2 reps;30 seconds    Passive Hamstring Stretch Limitations seated with pressure over knee    Hip Flexor Stretch 3 reps;30 seconds    Hip Flexor Stretch Limitations supine edge of table      Knee/Hip Exercises: Aerobic   Nustep L5 x 4 min with UE/LE to improve knee motion      Knee/Hip Exercises: Supine   Quad Sets 10 reps   5 sec hold   Quad Sets Limitations towel under heel    Heel Slides 10 reps    Heel Prop for Knee Extension 3 minutes    Heel Prop for Knee Extension Weight (lbs) 5    Straight Leg Raises 10 reps      Cryotherapy   Number Minutes Cryotherapy 15 Minutes    Cryotherapy Location Knee    Type of Cryotherapy Ice pack      Manual Therapy   Manual Therapy Joint mobilization;Soft tissue mobilization;Passive  ROM    Joint Mobilization Left patellar mobs all directions; tibiofemoral mobs A/P in supine, hooklying, seated positions    Soft tissue mobilization Left quad while in thomas position    Passive ROM Left knee flexion and extension                  PT Education - 07/28/20 1044    Education Details HEP    Person(s) Educated Patient    Methods Explanation    Comprehension Verbalized understanding;Need further instruction            PT Short Term Goals - 07/13/20 1729      PT SHORT TERM GOAL #1   Title pt to be IND with inital HEP    Time 4    Period Weeks    Status New    Target Date 08/10/20      PT SHORT TERM GOAL #2   Title pt to verbalize/ demo techinques to reduce pain and inflammation via RICE and HEP    Time 4    Period Weeks    Status New    Target Date 08/10/20      PT SHORT TERM GOAL #3   Title pt to increase knee flexion to >/= 90 degrees for fucntional progression    Time 4    Period Weeks    Status New    Target Date 08/10/20             PT Long Term Goals - 07/13/20 1730      PT LONG TERM GOAL #1   Title pt to  increase total arc knee ROM >/= 5 - 110 degrees for functional knee mobility required for ADLs    Time 12    Period Weeks    Status New    Target Date 10/05/20      PT LONG TERM GOAL #2   Title pt to increase LLE strength to >/= 4+/5 to promote knee/ hip stability with walking/ standing    Time 12    Period Weeks    Status New    Target Date 10/05/20      PT LONG TERM GOAL #3   Title pt to be able to stand and walk for >/= 1 hour with LRAD with no report limitations and max pain </= 2/10 for functonal endurance for in home and community amb    Time 12    Period Weeks    Status New    Target Date 10/05/20      PT LONG TERM GOAL #4   Title pt to be IND with all HEP given and is able to maintain and progress current LOF    Time 12    Period Weeks    Status New    Target Date 10/05/20                 Plan - 07/28/20 1048    Clinical Impression Statement Patient tolerated therapy well with no adverse effects. She is progressing well with her knee motion but does continues to exhibit increased swelling and pain with activity. Therapy focused on continued manual and stretching to improve motion, and light strengthening to improve control. She would benefit from continued skilled PT to progress mobility and strength to improve walking and maximize functional ability.    PT Treatment/Interventions Aquatic Therapy;Cryotherapy;Electrical Stimulation;Iontophoresis 4mg /ml Dexamethasone;Moist Heat;Ultrasound;Gait training;Stair training;Functional mobility training;Therapeutic activities;Therapeutic exercise;Balance training;Neuromuscular re-education;Manual techniques;Passive range of motion;Dry needling;Scar mobilization;Joint Manipulations;Vasopneumatic Device;Taping    PT  Next Visit Plan Review HEP update PRN, knee ROM, gross strengthening modalities. Gait training with AD as appropriate.    PT Home Exercise Plan [email or web address] - quad set, hamstring stretch ( supine and seated), heel slide  (supine with strap and seated), SAQ    Consulted and Agree with Plan of Care Patient           Patient will benefit from skilled therapeutic intervention in order to improve the following deficits and impairments:  Abnormal gait,Decreased activity tolerance,Decreased balance,Decreased endurance,Decreased mobility,Decreased range of motion,Decreased strength,Difficulty walking,Hypomobility,Increased edema,Increased fascial restricitons,Pain  Visit Diagnosis: Chronic pain of left knee  Muscle weakness (generalized)  Other abnormalities of gait and mobility  Localized edema     Problem List Patient Active Problem List   Diagnosis Date Noted  . Status post total knee replacement, left 07/19/2020  . Total knee replacement status, right 12/14/2019  . Class 2 obesity due to excess calories without serious comorbidity with body mass index (BMI) of 39.0 to 39.9 in adult 11/05/2019  . Unilateral primary osteoarthritis, right knee 11/05/2019  . Unilateral primary osteoarthritis, left knee 11/05/2019  . Bilateral primary osteoarthritis of knee 01/25/2019    Rosana Hoes, PT, DPT, LAT, ATC 07/28/20  11:30 AM Phone: [phone] Fax: [phone]   [facility] [address] [place], [place], [address] Phone: [phone]   Fax:  [phone]  Name: Julia [name] MRN: [id number] Date of Birth: March 03, 1966

## 2020-07-28 NOTE — Telephone Encounter (Signed)
Will renew oxycodone

## 2020-08-02 ENCOUNTER — Other Ambulatory Visit: Payer: Self-pay

## 2020-08-02 ENCOUNTER — Ambulatory Visit: Payer: Managed Care, Other (non HMO) | Admitting: Physical Therapy

## 2020-08-02 ENCOUNTER — Ambulatory Visit: Payer: Managed Care, Other (non HMO)

## 2020-08-02 DIAGNOSIS — R2689 Other abnormalities of gait and mobility: Secondary | ICD-10-CM

## 2020-08-02 DIAGNOSIS — M6281 Muscle weakness (generalized): Secondary | ICD-10-CM

## 2020-08-02 DIAGNOSIS — M25561 Pain in right knee: Secondary | ICD-10-CM

## 2020-08-02 DIAGNOSIS — M25562 Pain in left knee: Secondary | ICD-10-CM | POA: Diagnosis not present

## 2020-08-02 DIAGNOSIS — G8929 Other chronic pain: Secondary | ICD-10-CM

## 2020-08-02 DIAGNOSIS — R600 Localized edema: Secondary | ICD-10-CM

## 2020-08-02 DIAGNOSIS — M25661 Stiffness of right knee, not elsewhere classified: Secondary | ICD-10-CM

## 2020-08-02 NOTE — Therapy (Signed)
[facility] Outpatient Rehabilitation [facility] [address] [place], [place], [address] Phone: [phone]   Fax:  [phone]  Physical Therapy Treatment  Patient Details  Name: Julia [name] MRN: [id number] Date of Birth: 26-May-1966 Referring Provider (PT): [name], MD   Encounter Date: 08/02/2020   PT End of Session - 08/02/20 1704    Visit Number 4    Number of Visits 25    Date for PT Re-Evaluation 10/05/20    Authorization Type [employer]    Authorization - Number of Visits 23    PT Start Time 1703    PT Stop Time 1749    PT Time Calculation (min) 46 min    Activity Tolerance Patient tolerated treatment well    Behavior During Therapy [facility] for tasks assessed/performed           Past Medical History:  Diagnosis Date  . Anxiety   . Arthritis    knees, hands  . Depression   . Hypertension     Past Surgical History:  Procedure Laterality Date  . ABDOMINAL HYSTERECTOMY    . KNEE ARTHROSCOPY    . 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   Procedure: RIGHT TOTAL KNEE ARTHROPLASTY;  Surgeon: Valeria Batman, MD;  Location: [facility];  Service: Orthopedics;  Laterality: Right;  . TOTAL KNEE ARTHROPLASTY Left 07/04/2020   Procedure: LEFT TOTAL KNEE ARTHROPLASTY;  Surgeon: Valeria Batman, MD;  Location: [facility];  Service: Orthopedics;  Laterality: Left;    There were no vitals filed for this visit.   Subjective Assessment - 08/02/20 1710    Subjective Pt reports both her knees have felt tight the past 2 days which seems related to the colder weather.    Pertinent History hx of RTKA    Limitations Lifting;Standing;Walking;Sitting    How long can you sit comfortably? 30 min    Patient Stated Goals to have no pain, to get back to regualr motion, get life back to normal    Currently in Pain? Yes    Pain Score 7     Pain Location Knee    Pain Orientation Left    Pain Descriptors / Indicators Aching;Tightness    Pain Type Surgical pain    Pain  Onset More than a month ago    Pain Frequency Constant              [facility] PT Assessment - 08/02/20 0001      Circumferential Edema   Circumferential - Left  51.5      AROM   Left Knee Extension 11    Left Knee Flexion 98      PROM   Right Knee Flexion 100                         [facility] Adult PT Treatment/Exercise - 08/02/20 0001      Ambulation/Gait   Gait Comments Gait training with a SPC R hand. pt demonstrated a heel to toe gait pattern at an appropriate apce and balance. L knee ext is decreased during stance  and pt was provided verbal cue to consciously bend her L knee with advancing due to inhibition from swelling.      Exercises   Exercises Knee/Hip      Knee/Hip Exercises: Aerobic   Nustep L5 x 6 min with UE/LE to improve knee motion      Knee/Hip Exercises: Seated   Long Arc Quad Left;10 reps   3  sec   Other Seated Knee/Hip Exercises 2 chair hamstring stretch c quad set and gentle over pressure by pt for for L knee ext; 3x 20 sec      Knee/Hip Exercises: Supine   Quad Sets 10 reps;Left   5 sec hold   Quad Sets Limitations towel under heel    Heel Slides 10 reps;Left    Heel Slides Limitations strap for assist into greater ROM    Bridges 10 reps;Both    Straight Leg Raises 10 reps;Left      Knee/Hip Exercises: Sidelying   Hip ABduction Left;10 reps    Clams L; 10x                  PT Education - 08/02/20 2004    Education Details HEP    Person(s) Educated Patient    Methods Explanation;Demonstration;Tactile cues;Verbal cues;Handout    Comprehension Verbalized understanding;Returned demonstration;Verbal cues required;Tactile cues required            PT Short Term Goals - 07/13/20 1729      PT SHORT TERM GOAL #1   Title pt to be IND with inital HEP    Time 4    Period Weeks    Status New    Target Date 08/10/20      PT SHORT TERM GOAL #2   Title pt to verbalize/ demo techinques to reduce pain and inflammation via RICE  and HEP    Time 4    Period Weeks    Status New    Target Date 08/10/20      PT SHORT TERM GOAL #3   Title pt to increase knee flexion to >/= 90 degrees for fucntional progression    Time 4    Period Weeks    Status New    Target Date 08/10/20             PT Long Term Goals - 07/13/20 1730      PT LONG TERM GOAL #1   Title pt to increase total arc knee ROM >/= 5 - 110 degrees for functional knee mobility required for ADLs    Time 12    Period Weeks    Status New    Target Date 10/05/20      PT LONG TERM GOAL #2   Title pt to increase LLE strength to >/= 4+/5 to promote knee/ hip stability with walking/ standing    Time 12    Period Weeks    Status New    Target Date 10/05/20      PT LONG TERM GOAL #3   Title pt to be able to stand and walk for >/= 1 hour with LRAD with no report limitations and max pain </= 2/10 for functonal endurance for in home and community amb    Time 12    Period Weeks    Status New    Target Date 10/05/20      PT LONG TERM GOAL #4   Title pt to be IND with all HEP given and is able to maintain and progress current LOF    Time 12    Period Weeks    Status New    Target Date 10/05/20                 Plan - 08/02/20 2006    Clinical Impression Statement L knee swelling has not decreased impacting L knee ext and flexion ROMs which are at an appropriate level. Recommended elevation  and cold packs periodically during the day for swelling management. With gait training c a SPC in the R hand, pt demonstrated a heel to toe gait pattern at an appropriate pace and balance. L knee ext is decreased during stance, and pt was provided verbal cue to consciously bend her L knee with advancing due to inhibition from swelling. Pt demonstrates appropriate ability to walk with a SPC. hip strengthening exs were added to the pt's HEP.    Personal Factors and Comorbidities Comorbidity 3+    Comorbidities PMH: Rt TKA 12/14/19, L TKA on 07/04/2020 anx, dep, OA     Examination-Activity Limitations Stairs;Squat;Bend;Stand;Lift;Locomotion Level    Examination-Participation Restrictions Community Activity;Shop    Stability/Clinical Decision Making Evolving/Moderate complexity    Clinical Decision Making Moderate    Rehab Potential Good    PT Frequency 2x / week    PT Duration 12 weeks    PT Treatment/Interventions Aquatic Therapy;Cryotherapy;Electrical Stimulation;Iontophoresis 4mg /ml Dexamethasone;Moist Heat;Ultrasound;Gait training;Stair training;Functional mobility training;Therapeutic activities;Therapeutic exercise;Balance training;Neuromuscular re-education;Manual techniques;Passive range of motion;Dry needling;Scar mobilization;Joint Manipulations;Vasopneumatic Device;Taping    PT Next Visit Plan Review HEP update PRN, knee ROM, gross strengthening modalities. Gait training with AD as appropriate.    PT Home Exercise Plan [email or web address] - quad set, hamstring stretch ( supine and seated), heel slide (supine with strap and seated), SAQ, 2 chair hamstring stretc, SL hip abd, SL hip clam    Consulted and Agree with Plan of Care Patient           Patient will benefit from skilled therapeutic intervention in order to improve the following deficits and impairments:  Abnormal gait,Decreased activity tolerance,Decreased balance,Decreased endurance,Decreased mobility,Decreased range of motion,Decreased strength,Difficulty walking,Hypomobility,Increased edema,Increased fascial restricitons,Pain  Visit Diagnosis: Chronic pain of left knee  Muscle weakness (generalized)  Other abnormalities of gait and mobility  Localized edema  Acute pain of right knee  Stiffness of right knee, not elsewhere classified     Problem List Patient Active Problem List   Diagnosis Date Noted  . Status post total knee replacement, left 07/19/2020  . Total knee replacement status, right 12/14/2019  . Class 2 obesity due to excess calories without serious comorbidity with  body mass index (BMI) of 39.0 to 39.9 in adult 11/05/2019  . Unilateral primary osteoarthritis, right knee 11/05/2019  . Unilateral primary osteoarthritis, left knee 11/05/2019  . Bilateral primary osteoarthritis of knee 01/25/2019   01/27/2019 MS, PT 08/02/20 8:20 PM  [facility] Outpatient Rehabilitation [facility] [address] [place], [place], [place] Phone: [phone]   Fax:  [phone]  Name: Julia [name] MRN: Criss Alvine Date of Birth: 1965-11-21

## 2020-08-03 ENCOUNTER — Ambulatory Visit (INDEPENDENT_AMBULATORY_CARE_PROVIDER_SITE_OTHER): Payer: Managed Care, Other (non HMO) | Admitting: Orthopaedic Surgery

## 2020-08-03 ENCOUNTER — Ambulatory Visit: Payer: Managed Care, Other (non HMO) | Admitting: Family Medicine

## 2020-08-03 ENCOUNTER — Encounter: Payer: Self-pay | Admitting: Orthopaedic Surgery

## 2020-08-03 DIAGNOSIS — M1712 Unilateral primary osteoarthritis, left knee: Secondary | ICD-10-CM

## 2020-08-03 DIAGNOSIS — Z96652 Presence of left artificial knee joint: Secondary | ICD-10-CM

## 2020-08-03 NOTE — Progress Notes (Signed)
Office Visit Note   Patient: Julia [name]           Date of Birth: [birth year]           MRN: [id number] Visit Date: 08/03/2020              Requested by: Lavada Mesi, MD [address] [place],  [place] [address] PCP: Lavada Mesi, MD   Assessment & Plan: Visit Diagnoses:  1. Unilateral primary osteoarthritis, left knee   2. Status post total knee replacement, left     Plan: 1 month status post primary left total knee replacement and doing well.  No fever or chills.  Has full extension and probably 95 degrees of flexion.  Encouraged her to wean from the walker and use a cane.  Continue physical therapy.  Taking less oxycodone.  No problems  Follow-Up Instructions: Return in about 1 month (around 09/03/2020).   Orders:  No orders of the defined types were placed in this encounter.  No orders of the defined types were placed in this encounter.     Procedures: No procedures performed   Clinical Data: No additional findings.   Subjective: Chief Complaint  Patient presents with  . Left Knee - Routine Post Op, Pain  Status post primary left total knee replacement without complications.  Not having any fever or chills.  Taking less of the oxycodone.  Still using a walker and going to physical therapy no shortness of breath or chest pain  HPI  Review of Systems   Objective: Vital Signs: There were no vitals taken for this visit.  Physical Exam  Ortho Exam left knee incision healing without problem.  Has full extension and flex to about 95 degrees.  No instability.  No localized areas of tenderness.  No calf pain.  Neurologically intact  Specialty Comments:  No specialty comments available.  Imaging: No results found.   PMFS History: Patient Active Problem List   Diagnosis Date Noted  . Status post total knee replacement, left 07/19/2020  . Total knee replacement status, right 12/14/2019  . Class 2 obesity due to excess calories without serious  comorbidity with body mass index (BMI) of 39.0 to 39.9 in adult 11/05/2019  . Unilateral primary osteoarthritis, right knee 11/05/2019  . Unilateral primary osteoarthritis, left knee 11/05/2019  . Bilateral primary osteoarthritis of knee 01/25/2019   Past Medical History:  Diagnosis Date  . Anxiety   . Arthritis    knees, hands  . Depression   . Hypertension     Family History  Problem Relation Age of Onset  . Cancer Mother     Past Surgical History:  Procedure Laterality Date  . ABDOMINAL HYSTERECTOMY    . KNEE ARTHROSCOPY    . 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   Procedure: RIGHT TOTAL KNEE ARTHROPLASTY;  Surgeon: Valeria Batman, MD;  Location: [facility];  Service: Orthopedics;  Laterality: Right;  . TOTAL KNEE ARTHROPLASTY Left 07/04/2020   Procedure: LEFT TOTAL KNEE ARTHROPLASTY;  Surgeon: Valeria Batman, MD;  Location: [facility];  Service: Orthopedics;  Laterality: Left;   Social History   Occupational History  . Not on file  Tobacco Use  . Smoking status: Former Smoker    Packs/day: 1.00    Years: 15.00    Pack years: 15.00    Types: Cigarettes    Quit date: 12/02/2012    Years since quitting: 7.6  . Smokeless tobacco: Never Used  Vaping Use  . Vaping Use:  Former  Substance and Sexual Activity  . Alcohol use: Not Currently  . Drug use: Never  . Sexual activity: Yes     Valeria Batman, MD   Note - This record has been created using [employer].  Chart creation errors have been sought, but may not always  have been located. Such creation errors do not reflect on  the standard of medical care.

## 2020-08-04 ENCOUNTER — Ambulatory Visit: Payer: Managed Care, Other (non HMO) | Admitting: Physical Therapy

## 2020-08-04 ENCOUNTER — Other Ambulatory Visit: Payer: Self-pay

## 2020-08-04 ENCOUNTER — Encounter: Payer: Self-pay | Admitting: Physical Therapy

## 2020-08-04 DIAGNOSIS — R600 Localized edema: Secondary | ICD-10-CM

## 2020-08-04 DIAGNOSIS — G8929 Other chronic pain: Secondary | ICD-10-CM

## 2020-08-04 DIAGNOSIS — M6281 Muscle weakness (generalized): Secondary | ICD-10-CM

## 2020-08-04 DIAGNOSIS — M25562 Pain in left knee: Secondary | ICD-10-CM

## 2020-08-04 DIAGNOSIS — R2689 Other abnormalities of gait and mobility: Secondary | ICD-10-CM

## 2020-08-04 NOTE — Patient Instructions (Signed)
Access Code: [email or web address] URL: [email or web address] Date: 08/04/2020 Prepared by: Rosana Hoes  Exercises Supine Quad Set - 1 x daily - 7 x weekly - 10 reps - 5 seconds hold Supine Knee Extension Strengthening - 1 x daily - 7 x weekly - 2 sets - 10 reps Active Straight Leg Raise with Quad Set - 1 x daily - 7 x weekly - 2 sets - 10 reps Seated Hamstring Stretch - 1 x daily - 7 x weekly - 2 reps - 2 sets - 30 hold Supine Heel Slide with Strap - 1 x daily - 7 x weekly - 10 reps - 2 sets - 5 seconds hold Seated Heel Slide - 1 x daily - 7 x weekly - 2 sets - 10 reps Supine Bridge - 1 x daily - 7 x weekly - 2 sets - 10 reps - 3 hold Sidelying Hip Abduction - 1 x daily - 7 x weekly - 2 sets - 10 reps - 3 hold Squat with Counter Support - 1 x daily - 7 x weekly - 2 sets - 10 reps

## 2020-08-04 NOTE — Therapy (Signed)
[facility] Outpatient Rehabilitation [facility] [address] [place], [place], [address] Phone: [phone]   Fax:  [phone]  Physical Therapy Treatment  Patient Details  Name: Julia [name] MRN: [id number] Date of Birth: [birth year] Referring Provider (PT): Norlene , MD   Encounter Date: 08/04/2020   PT End of Session - 08/04/20 1051    Visit Number 5    Number of Visits 25    Date for PT Re-Evaluation 10/05/20    Authorization Type [employer]    PT Start Time 1045    PT Stop Time 1130    PT Time Calculation (min) 45 min    Activity Tolerance Patient tolerated treatment well    Behavior During Therapy [facility] for tasks assessed/performed           Past Medical History:  Diagnosis Date  . Anxiety   . Arthritis    knees, hands  . Depression   . Hypertension     Past Surgical History:  Procedure Laterality Date  . ABDOMINAL HYSTERECTOMY    . KNEE ARTHROSCOPY    . 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   Procedure: RIGHT TOTAL KNEE ARTHROPLASTY;  Surgeon: Valeria Batman, MD;  Location: [facility];  Service: Orthopedics;  Laterality: Right;  . TOTAL KNEE ARTHROPLASTY Left 07/04/2020   Procedure: LEFT TOTAL KNEE ARTHROPLASTY;  Surgeon: Valeria Batman, MD;  Location: [facility];  Service: Orthopedics;  Laterality: Left;    There were no vitals filed for this visit.   Subjective Assessment - 08/04/20 1049    Subjective Patient reports both knees are stiff today due to the cold. She is using the walker today because she didn't sleep well last night and wanted to use it for safety.    Patient Stated Goals to have no pain, to get back to regualr motion, get life back to normal    Currently in Pain? Yes    Pain Score 6     Pain Location Knee    Pain Orientation Left    Pain Descriptors / Indicators Tightness;Aching    Pain Type Chronic pain;Surgical pain    Pain Onset More than a month ago    Pain Frequency Constant              [facility] PT  Assessment - 08/04/20 0001      AROM   Left Knee Flexion 100      PROM   Right Knee Flexion 104                         [facility] Adult PT Treatment/Exercise - 08/04/20 0001      Exercises   Exercises Knee/Hip      Knee/Hip Exercises: Stretches   Passive Hamstring Stretch 3 reps;30 seconds    Passive Hamstring Stretch Limitations seated with pressure over knee    Hip Flexor Stretch 3 reps;60 seconds    Hip Flexor Stretch Limitations supine edge of table      Knee/Hip Exercises: Aerobic   Nustep L5 x 5 min with UE/LE to improve knee motion      Knee/Hip Exercises: Standing   Functional Squat 10 reps    Functional Squat Limitations holding on to sink at counter    Other Standing Knee Exercises Alternating march with focus on knee flexion through available range x 10 each   BUE support     Knee/Hip Exercises: Seated   Long Arc Quad 10 reps    Long Arc  Quad Limitations moving through full available motion      Knee/Hip Exercises: Supine   Straight Leg Raises 2 sets;10 reps      Manual Therapy   Manual Therapy Joint mobilization;Soft tissue mobilization;Passive ROM    Joint Mobilization Left patellar mobs all directions; tibiofemoral mobs A/P in supine, hooklying, seated positions    Soft tissue mobilization Left quad while in thomas position    Passive ROM Left knee flexion and extension                  PT Education - 08/04/20 1051    Education Details HEP update    Person(s) Educated Patient    Methods Explanation;Demonstration;Verbal cues;Handout    Comprehension Verbalized understanding;Need further instruction;Returned demonstration;Verbal cues required            PT Short Term Goals - 07/13/20 1729      PT SHORT TERM GOAL #1   Title pt to be IND with inital HEP    Time 4    Period Weeks    Status New    Target Date 08/10/20      PT SHORT TERM GOAL #2   Title pt to verbalize/ demo techinques to reduce pain and inflammation via RICE  and HEP    Time 4    Period Weeks    Status New    Target Date 08/10/20      PT SHORT TERM GOAL #3   Title pt to increase knee flexion to >/= 90 degrees for fucntional progression    Time 4    Period Weeks    Status New    Target Date 08/10/20             PT Long Term Goals - 07/13/20 1730      PT LONG TERM GOAL #1   Title pt to increase total arc knee ROM >/= 5 - 110 degrees for functional knee mobility required for ADLs    Time 12    Period Weeks    Status New    Target Date 10/05/20      PT LONG TERM GOAL #2   Title pt to increase LLE strength to >/= 4+/5 to promote knee/ hip stability with walking/ standing    Time 12    Period Weeks    Status New    Target Date 10/05/20      PT LONG TERM GOAL #3   Title pt to be able to stand and walk for >/= 1 hour with LRAD with no report limitations and max pain </= 2/10 for functonal endurance for in home and community amb    Time 12    Period Weeks    Status New    Target Date 10/05/20      PT LONG TERM GOAL #4   Title pt to be IND with all HEP given and is able to maintain and progress current LOF    Time 12    Period Weeks    Status New    Target Date 10/05/20                 Plan - 08/04/20 1052    Clinical Impression Statement Patient tolerated therapy well with no adverse effects. She exhibits improved knee flexion this visit and is tolerating progressions in strengthening well. Patient continues to report increased pain with end range motion. She was using RW this visit due to feel inbalanced with both knees, but was encouraged to practice  using cane at home. She would benefit from continued skilled PT to progress mobility and strength to improve walking and maximize functional ability.    PT Treatment/Interventions Aquatic Therapy;Cryotherapy;Electrical Stimulation;Iontophoresis 4mg /ml Dexamethasone;Moist Heat;Ultrasound;Gait training;Stair training;Functional mobility training;Therapeutic  activities;Therapeutic exercise;Balance training;Neuromuscular re-education;Manual techniques;Passive range of motion;Dry needling;Scar mobilization;Joint Manipulations;Vasopneumatic Device;Taping    PT Next Visit Plan Review HEP update PRN, manual and stretching for knee ROM, strengthening for gross LE, gait training with cane    PT Home Exercise Plan [email or web address]    Consulted and Agree with Plan of Care Patient           Patient will benefit from skilled therapeutic intervention in order to improve the following deficits and impairments:  Abnormal gait,Decreased activity tolerance,Decreased balance,Decreased endurance,Decreased mobility,Decreased range of motion,Decreased strength,Difficulty walking,Hypomobility,Increased edema,Increased fascial restricitons,Pain  Visit Diagnosis: Chronic pain of left knee  Muscle weakness (generalized)  Other abnormalities of gait and mobility  Localized edema     Problem List Patient Active Problem List   Diagnosis Date Noted  . Status post total knee replacement, left 07/19/2020  . Total knee replacement status, right 12/14/2019  . Class 2 obesity due to excess calories without serious comorbidity with body mass index (BMI) of 39.0 to 39.9 in adult 11/05/2019  . Unilateral primary osteoarthritis, right knee 11/05/2019  . Unilateral primary osteoarthritis, left knee 11/05/2019  . Bilateral primary osteoarthritis of knee 01/25/2019    01/27/2019, PT, DPT, LAT, ATC 08/04/20  12:21 PM Phone: [phone] Fax: [phone]   [facility] Outpatient Rehabilitation [facility] [address] [place], [place], [place] Phone: [phone]   Fax:  [phone]  Name: Julia [name] MRN: Criss Alvine Date of Birth: 04/27/1966

## 2020-08-07 ENCOUNTER — Ambulatory Visit: Payer: Managed Care, Other (non HMO) | Admitting: Orthopaedic Surgery

## 2020-08-07 ENCOUNTER — Other Ambulatory Visit: Payer: Self-pay | Admitting: Orthopaedic Surgery

## 2020-08-07 NOTE — Telephone Encounter (Signed)
Please advise 

## 2020-08-07 NOTE — Telephone Encounter (Signed)
See below

## 2020-08-08 ENCOUNTER — Other Ambulatory Visit: Payer: Self-pay | Admitting: Orthopaedic Surgery

## 2020-08-08 MED ORDER — HYDROCODONE-ACETAMINOPHEN 5-325 MG PO TABS: 1.0000 | ORAL_TABLET | Freq: Three times a day (TID) | ORAL | 0 refills | Status: DC | PRN

## 2020-08-08 NOTE — Telephone Encounter (Signed)
Sent to pharmacy 

## 2020-08-08 NOTE — Telephone Encounter (Signed)
Please advise 

## 2020-08-08 NOTE — Telephone Encounter (Signed)
Could you please refuse the percocet Rx? I do not have authorization to do so, and I cannot sign off until it is done.

## 2020-08-10 ENCOUNTER — Ambulatory Visit: Payer: Managed Care, Other (non HMO)

## 2020-08-10 ENCOUNTER — Encounter: Payer: Self-pay | Admitting: Orthopaedic Surgery

## 2020-08-14 ENCOUNTER — Other Ambulatory Visit: Payer: Self-pay | Admitting: Orthopaedic Surgery

## 2020-08-14 MED ORDER — HYDROCODONE-ACETAMINOPHEN 5-325 MG PO TABS: 1.0000 | ORAL_TABLET | Freq: Four times a day (QID) | ORAL | 0 refills | Status: DC | PRN

## 2020-08-14 NOTE — Telephone Encounter (Signed)
sent 

## 2020-08-17 ENCOUNTER — Other Ambulatory Visit: Payer: Self-pay

## 2020-08-17 ENCOUNTER — Ambulatory Visit: Payer: Managed Care, Other (non HMO) | Attending: Family Medicine

## 2020-08-17 DIAGNOSIS — G8929 Other chronic pain: Secondary | ICD-10-CM | POA: Diagnosis present

## 2020-08-17 DIAGNOSIS — M6281 Muscle weakness (generalized): Secondary | ICD-10-CM | POA: Diagnosis present

## 2020-08-17 DIAGNOSIS — M25562 Pain in left knee: Secondary | ICD-10-CM | POA: Diagnosis present

## 2020-08-17 DIAGNOSIS — M25561 Pain in right knee: Secondary | ICD-10-CM | POA: Diagnosis present

## 2020-08-17 DIAGNOSIS — R600 Localized edema: Secondary | ICD-10-CM | POA: Insufficient documentation

## 2020-08-17 DIAGNOSIS — R2689 Other abnormalities of gait and mobility: Secondary | ICD-10-CM | POA: Diagnosis present

## 2020-08-17 NOTE — Therapy (Signed)
[facility] Outpatient Rehabilitation [facility] [address] [place], [place], [address] Phone: [phone]   Fax:  [phone]  Physical Therapy Treatment  Patient Details  Name: Julia [name] MRN: [id number] Date of Birth: [birth year] Referring Provider (PT): [name], MD   Encounter Date: 08/17/2020   PT End of Session - 08/17/20 0813    Visit Number 6    Number of Visits 25    Date for PT Re-Evaluation 10/05/20    Authorization Type [employer]    PT Start Time 0800    PT Stop Time 0845    PT Time Calculation (min) 45 min    Activity Tolerance Patient tolerated treatment well    Behavior During Therapy [facility] for tasks assessed/performed           Past Medical History:  Diagnosis Date  . Anxiety   . Arthritis    knees, hands  . Depression   . Hypertension     Past Surgical History:  Procedure Laterality Date  . ABDOMINAL HYSTERECTOMY    . KNEE ARTHROSCOPY    . 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   Procedure: RIGHT TOTAL KNEE ARTHROPLASTY;  Surgeon: Valeria Batman, MD;  Location: [facility];  Service: Orthopedics;  Laterality: Right;  . TOTAL KNEE ARTHROPLASTY Left 07/04/2020   Procedure: LEFT TOTAL KNEE ARTHROPLASTY;  Surgeon: Valeria Batman, MD;  Location: [facility];  Service: Orthopedics;  Laterality: Left;    There were no vitals filed for this visit.   Subjective Assessment - 08/17/20 0807    Subjective Pt reports she wasn't feeling great last week mentally, but had medication adjusted and feels better. She didn't do much of anything until last night when walked with her spouse around the block. She did take pain meds at 7am. Pain wakes her up at night as an inner, deep stabbing, a little less often now.    Patient Stated Goals to have no pain, to get back to regualr motion, get life back to normal    Currently in Pain? Yes    Pain Score 4     Pain Location Knee    Pain Orientation Left    Pain Descriptors / Indicators Tightness;Aching     Pain Type Chronic pain;Surgical pain    Pain Onset More than a month ago              [facility] PT Assessment - 08/17/20 0001      AROM   Left Knee Extension 15   to 10 post session   Left Knee Flexion 90   did not focus on flexion today                        [facility] Adult PT Treatment/Exercise - 08/17/20 0001      Ambulation/Gait   Assistive device --   hurry cane   Gait Comments Gait training with SPC in R hand, step to pattern      Self-Care   RICE see pt edu, continuing RICE after extra activity on her feet      Knee/Hip Exercises: Aerobic   Nustep L5 x 6 min with UE/LE to improve knee motion      Knee/Hip Exercises: Seated   Other Seated Knee/Hip Exercises seated knee ext mobs with self osc 3x30"      Knee/Hip Exercises: Prone   Prone Knee Hang 3 minutes;Weights    Prone Knee Hang Weights (lbs) 3    Other Prone Exercises  Prone quad set with pillow under ankle 10x5"      Manual Therapy   Manual Therapy Joint mobilization;Soft tissue mobilization;Passive ROM    Joint Mobilization L patellar mobs multidirection, extension mobs grade 3 APs with femoral IR and tibial ER    Soft tissue mobilization peripatellar, scar mobility    Passive ROM L knee extension                  PT Education - 08/17/20 1017    Education Details edu for performing prone knee hang followed by prone quad set and provided handout; edu for pt to focus on exercises throughout the day and normalcy of stiffness after immobility for longer periods, i.e. 1 hour    Person(s) Educated Patient    Methods Explanation;Demonstration;Handout;Verbal cues;Tactile cues    Comprehension Returned demonstration;Verbalized understanding;Verbal cues required            PT Short Term Goals - 08/17/20 1041      PT SHORT TERM GOAL #1   Title pt to be IND with inital HEP    Baseline intermittent    Time 4    Period Weeks    Status On-going    Target Date 08/10/20      PT SHORT TERM  GOAL #2   Title pt to verbalize/ demo techinques to reduce pain and inflammation via RICE and HEP    Time 4    Period Weeks    Status Achieved    Target Date 08/10/20      PT SHORT TERM GOAL #3   Title pt to increase knee flexion to >/= 90 degrees for fucntional progression    Baseline 90    Time 4    Period Weeks    Status Achieved    Target Date 08/10/20             PT Long Term Goals - 07/13/20 1730      PT LONG TERM GOAL #1   Title pt to increase total arc knee ROM >/= 5 - 110 degrees for functional knee mobility required for ADLs    Time 12    Period Weeks    Status New    Target Date 10/05/20      PT LONG TERM GOAL #2   Title pt to increase LLE strength to >/= 4+/5 to promote knee/ hip stability with walking/ standing    Time 12    Period Weeks    Status New    Target Date 10/05/20      PT LONG TERM GOAL #3   Title pt to be able to stand and walk for >/= 1 hour with LRAD with no report limitations and max pain </= 2/10 for functonal endurance for in home and community amb    Time 12    Period Weeks    Status New    Target Date 10/05/20      PT LONG TERM GOAL #4   Title pt to be IND with all HEP given and is able to maintain and progress current LOF    Time 12    Period Weeks    Status New    Target Date 10/05/20                 Plan - 08/17/20 2595    Clinical Impression Statement Pt presents with no improvements today, but lack of diligence to HEP last week with mental health setback. Pt reports having meds revised and feeling  better, adding more activity yesterday. She continues to lack ROM with a 15 degree extension deficit to start the session, but responded well to prone knee hang and prone quad sets improving to 10 degrees. Pt ambulates with SPC today on L side. Reduced height by notch for appropriate fit and adjusted pattern to right side with patient compliant on her way out, demonstrating smoother gait.    PT Treatment/Interventions Aquatic  Therapy;Cryotherapy;Electrical Stimulation;Iontophoresis 4mg /ml Dexamethasone;Moist Heat;Ultrasound;Gait training;Stair training;Functional mobility training;Therapeutic activities;Therapeutic exercise;Balance training;Neuromuscular re-education;Manual techniques;Passive range of motion;Dry needling;Scar mobilization;Joint Manipulations;Vasopneumatic Device;Taping    PT Next Visit Plan FOTO, Review HEP update PRN, manual and stretching for knee ROM, strengthening for gross LE, gait training with cane    PT Home Exercise Plan [email or web address]    Consulted and Agree with Plan of Care Patient           Patient will benefit from skilled therapeutic intervention in order to improve the following deficits and impairments:  Abnormal gait,Decreased activity tolerance,Decreased balance,Decreased endurance,Decreased mobility,Decreased range of motion,Decreased strength,Difficulty walking,Hypomobility,Increased edema,Increased fascial restricitons,Pain  Visit Diagnosis: Chronic pain of left knee  Muscle weakness (generalized)  Other abnormalities of gait and mobility  Localized edema     Problem List Patient Active Problem List   Diagnosis Date Noted  . Status post total knee replacement, left 07/19/2020  . Total knee replacement status, right 12/14/2019  . Class 2 obesity due to excess calories without serious comorbidity with body mass index (BMI) of 39.0 to 39.9 in adult 11/05/2019  . Unilateral primary osteoarthritis, right knee 11/05/2019  . Unilateral primary osteoarthritis, left knee 11/05/2019  . Bilateral primary osteoarthritis of knee 01/25/2019    01/27/2019, PT, DPT 08/17/2020, 10:42 AM  [facility] [address] [place], [place], [place] Phone: [phone]   Fax:  [phone]  Name: Julia [name] MRN: Criss Alvine Date of Birth: 1966-06-20

## 2020-08-18 ENCOUNTER — Ambulatory Visit: Payer: Managed Care, Other (non HMO)

## 2020-08-18 DIAGNOSIS — M25562 Pain in left knee: Secondary | ICD-10-CM

## 2020-08-18 DIAGNOSIS — R2689 Other abnormalities of gait and mobility: Secondary | ICD-10-CM

## 2020-08-18 DIAGNOSIS — M6281 Muscle weakness (generalized): Secondary | ICD-10-CM

## 2020-08-18 DIAGNOSIS — G8929 Other chronic pain: Secondary | ICD-10-CM

## 2020-08-18 DIAGNOSIS — R600 Localized edema: Secondary | ICD-10-CM

## 2020-08-18 NOTE — Therapy (Signed)
[facility] Outpatient Rehabilitation [facility] [address] [place], [place], [address] Phone: [phone]   Fax:  [phone]  Physical Therapy Treatment  Patient Details  Name: ELANOR CALE MRN: [id number] Date of Birth: 04-25-1966 Referring Provider (PT): [name], MD   Encounter Date: 08/18/2020   PT End of Session - 08/18/20 0942    Visit Number 7    Number of Visits 25    Date for PT Re-Evaluation 10/05/20    Authorization Type [employer]    PT Start Time 0933    PT Stop Time 1015    PT Time Calculation (min) 42 min    Activity Tolerance Patient tolerated treatment well    Behavior During Therapy [facility] for tasks assessed/performed           Past Medical History:  Diagnosis Date  . Anxiety   . Arthritis    knees, hands  . Depression   . Hypertension     Past Surgical History:  Procedure Laterality Date  . ABDOMINAL HYSTERECTOMY    . KNEE ARTHROSCOPY    . 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   Procedure: RIGHT TOTAL KNEE ARTHROPLASTY;  Surgeon: Valeria Batman, MD;  Location: [facility];  Service: Orthopedics;  Laterality: Right;  . TOTAL KNEE ARTHROPLASTY Left 07/04/2020   Procedure: LEFT TOTAL KNEE ARTHROPLASTY;  Surgeon: Valeria Batman, MD;  Location: [facility];  Service: Orthopedics;  Laterality: Left;    There were no vitals filed for this visit.   Subjective Assessment - 08/18/20 0938    Subjective Pt reports she was motivated after yesterday and did her prone knee hangs. She was pretty sore after yesterday.    Patient Stated Goals to have no pain, to get back to regualr motion, get life back to normal    Currently in Pain? Yes    Pain Score 3     Pain Location Knee    Pain Orientation Left    Pain Descriptors / Indicators Tightness;Aching    Pain Type Chronic pain;Surgical pain    Pain Onset More than a month ago              [facility] PT Assessment - 08/18/20 0001      AROM   Left Knee Extension 10   8 post manual   Left  Knee Flexion 90   98 post manual                        [facility] Adult PT Treatment/Exercise - 08/18/20 0001      Knee/Hip Exercises: Aerobic   Nustep L5 x 6 min with UE/LE to improve knee motion      Knee/Hip Exercises: Standing   Functional Squat 10 reps    Functional Squat Limitations holding onto sink at counter      Knee/Hip Exercises: Seated   Long Arc Quad 15 reps    Long Arc Quad Weight 2 lbs.    Long [employer] Limitations moving through full available motion      Knee/Hip Exercises: Prone   Prone Knee Hang Weights (lbs) reviewed    Other Prone Exercises Prone quad set with pillow under ankle 15x5"      Manual Therapy   Manual Therapy Joint mobilization;Soft tissue mobilization;Passive ROM    Joint Mobilization L patellar mobs multidirection, flexion mobs in endrange knee flexion, extension mobs grade 3 APs with femoral IR and tibial ER    Soft tissue mobilization peripatellar, scar mobility  Passive ROM L knee flexion/extension                  PT Education - 08/18/20 1007    Education Details continue to perform prone knee hangs and add the prone knee quad sets afterward    Person(s) Educated Patient    Methods Explanation;Demonstration    Comprehension Verbalized understanding            PT Short Term Goals - 08/17/20 1041      PT SHORT TERM GOAL #1   Title pt to be IND with inital HEP    Baseline intermittent    Time 4    Period Weeks    Status On-going    Target Date 08/10/20      PT SHORT TERM GOAL #2   Title pt to verbalize/ demo techinques to reduce pain and inflammation via RICE and HEP    Time 4    Period Weeks    Status Achieved    Target Date 08/10/20      PT SHORT TERM GOAL #3   Title pt to increase knee flexion to >/= 90 degrees for fucntional progression    Baseline 90    Time 4    Period Weeks    Status Achieved    Target Date 08/10/20             PT Long Term Goals - 07/13/20 1730      PT LONG TERM  GOAL #1   Title pt to increase total arc knee ROM >/= 5 - 110 degrees for functional knee mobility required for ADLs    Time 12    Period Weeks    Status New    Target Date 10/05/20      PT LONG TERM GOAL #2   Title pt to increase LLE strength to >/= 4+/5 to promote knee/ hip stability with walking/ standing    Time 12    Period Weeks    Status New    Target Date 10/05/20      PT LONG TERM GOAL #3   Title pt to be able to stand and walk for >/= 1 hour with LRAD with no report limitations and max pain </= 2/10 for functonal endurance for in home and community amb    Time 12    Period Weeks    Status New    Target Date 10/05/20      PT LONG TERM GOAL #4   Title pt to be IND with all HEP given and is able to maintain and progress current LOF    Time 12    Period Weeks    Status New    Target Date 10/05/20                 Plan - 08/18/20 0959    Clinical Impression Statement Pt presents with good carryover of knee extension at 10 degrees, where she left off yesterday. She has been practicing prone knee hangs already and feeling better today. Pt tolerated tx well with improvement in L knee AROM 8-98. Added weight to LAQ and brought squats back for quad activation and strength.    PT Treatment/Interventions Aquatic Therapy;Cryotherapy;Electrical Stimulation;Iontophoresis 4mg /ml Dexamethasone;Moist Heat;Ultrasound;Gait training;Stair training;Functional mobility training;Therapeutic activities;Therapeutic exercise;Balance training;Neuromuscular re-education;Manual techniques;Passive range of motion;Dry needling;Scar mobilization;Joint Manipulations;Vasopneumatic Device;Taping    PT Next Visit Plan Review HEP update PRN, manual and stretching for knee ROM, knee extension and quad strength, strengthening for gross LE, gait training as  needed    PT Home Exercise Plan [email or web address]    Consulted and Agree with Plan of Care Patient           Patient will benefit from skilled therapeutic  intervention in order to improve the following deficits and impairments:  Abnormal gait,Decreased activity tolerance,Decreased balance,Decreased endurance,Decreased mobility,Decreased range of motion,Decreased strength,Difficulty walking,Hypomobility,Increased edema,Increased fascial restricitons,Pain  Visit Diagnosis: Chronic pain of left knee  Muscle weakness (generalized)  Other abnormalities of gait and mobility  Localized edema     Problem List Patient Active Problem List   Diagnosis Date Noted  . Status post total knee replacement, left 07/19/2020  . Total knee replacement status, right 12/14/2019  . Class 2 obesity due to excess calories without serious comorbidity with body mass index (BMI) of 39.0 to 39.9 in adult 11/05/2019  . Unilateral primary osteoarthritis, right knee 11/05/2019  . Unilateral primary osteoarthritis, left knee 11/05/2019  . Bilateral primary osteoarthritis of knee 01/25/2019    Marcelline Mates, PT, DPT 08/18/2020, 11:07 AM  [facility] [address] [place], [place], [address] Phone: [phone]   Fax:  [phone]  Name: DHALIA ZINGARO MRN: [id number] Date of Birth: 01/03/1966

## 2020-08-21 ENCOUNTER — Other Ambulatory Visit: Payer: Self-pay | Admitting: Orthopaedic Surgery

## 2020-08-21 MED ORDER — HYDROCODONE-ACETAMINOPHEN 5-325 MG PO TABS: 1.0000 | ORAL_TABLET | Freq: Three times a day (TID) | ORAL | 0 refills | Status: DC | PRN

## 2020-08-21 NOTE — Telephone Encounter (Signed)
Will prescribe

## 2020-08-21 NOTE — Telephone Encounter (Signed)
Please refuse it or refill from the tabs provided.

## 2020-08-22 NOTE — Telephone Encounter (Signed)
OK- compromise 3 months-please cALL

## 2020-08-22 NOTE — Telephone Encounter (Signed)
Called patient. No answer. Left message on voicemail concerning information below.

## 2020-08-24 ENCOUNTER — Other Ambulatory Visit: Payer: Self-pay

## 2020-08-24 ENCOUNTER — Ambulatory Visit: Payer: Managed Care, Other (non HMO)

## 2020-08-24 DIAGNOSIS — G8929 Other chronic pain: Secondary | ICD-10-CM

## 2020-08-24 DIAGNOSIS — R2689 Other abnormalities of gait and mobility: Secondary | ICD-10-CM

## 2020-08-24 DIAGNOSIS — M25562 Pain in left knee: Secondary | ICD-10-CM | POA: Diagnosis not present

## 2020-08-24 DIAGNOSIS — M6281 Muscle weakness (generalized): Secondary | ICD-10-CM

## 2020-08-24 DIAGNOSIS — R600 Localized edema: Secondary | ICD-10-CM

## 2020-08-24 NOTE — Therapy (Signed)
[facility] Outpatient Rehabilitation [facility] [address] [place], [place], [address] Phone: [phone]   Fax:  [phone]  Physical Therapy Treatment  Patient Details  Name: Julia [name] MRN: [id number] Date of Birth: [birth year] Referring Provider (PT): [name], MD   Encounter Date: 08/24/2020   PT End of Session - 08/24/20 0809    Visit Number 8    Number of Visits 25    Date for PT Re-Evaluation 10/05/20    Authorization Type [employer]    Authorization Time Period --    PT Start Time 0803    PT Stop Time 0848    PT Time Calculation (min) 45 min    Activity Tolerance Patient tolerated treatment well    Behavior During Therapy [facility] for tasks assessed/performed           Past Medical History:  Diagnosis Date  . Anxiety   . Arthritis    knees, hands  . Depression   . Hypertension     Past Surgical History:  Procedure Laterality Date  . ABDOMINAL HYSTERECTOMY    . KNEE ARTHROSCOPY    . 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   Procedure: RIGHT TOTAL KNEE ARTHROPLASTY;  Surgeon: Valeria Batman, MD;  Location: [facility];  Service: Orthopedics;  Laterality: Right;  . TOTAL KNEE ARTHROPLASTY Left 07/04/2020   Procedure: LEFT TOTAL KNEE ARTHROPLASTY;  Surgeon: Valeria Batman, MD;  Location: [facility];  Service: Orthopedics;  Laterality: Left;    There were no vitals filed for this visit.   Subjective Assessment - 08/24/20 0806    Subjective Pt reports yesterday was one of those days she "felt the hardware". Like "sheet metal was wrapped around her knee when she tried to bend or straighten it".    Patient Stated Goals to have no pain, to get back to regualr motion, get life back to normal    Currently in Pain? Yes    Pain Score 6     Pain Location Knee    Pain Orientation Left    Pain Descriptors / Indicators Aching    Pain Type Chronic pain;Surgical pain    Pain Onset More than a month ago                              [facility] Adult PT Treatment/Exercise - 08/24/20 0001      Knee/Hip Exercises: Stretches   Passive Hamstring Stretch Left;2 reps;30 seconds    Passive Hamstring Stretch Limitations strap    Gastroc Stretch Left;2 reps;30 seconds    Gastroc Stretch Limitations long sitting with strap      Knee/Hip Exercises: Aerobic   Nustep L6 x 6 min LE to improve knee ROM      Knee/Hip Exercises: Standing   Functional Squat 10 reps    Functional Squat Limitations holding onto sink at counter   to heel raise x 3 each with quad set focus     Knee/Hip Exercises: Seated   Long Arc Quad 2 sets;10 reps    Long Arc Quad Weight 3 lbs.   5# for 2nd set     Knee/Hip Exercises: Supine   Short Arc Quad Sets 1 set;10 reps    Short Arc Quad Sets Limitations 3# on FR, 5" hold      Manual Therapy   Manual Therapy Joint mobilization;Soft tissue mobilization;Passive ROM    Joint Mobilization L patellar mobs multidirection, extension mobs grade 3 APs with  tibial block and APs to femur grade 3 with intermittent superior patellar mobs   pillow under calf   Soft tissue mobilization quad in thomas position off EOT    Passive ROM L knee flexion/extension                  PT Education - 08/24/20 0856    Education Details standing squats at kitchen sink at home for quad activation 3-5 seconds before standing and focusing on knee extension for heel raises    Person(s) Educated Patient    Methods Explanation;Demonstration;Verbal cues    Comprehension Verbalized understanding;Returned demonstration;Verbal cues required            PT Short Term Goals - 08/17/20 1041      PT SHORT TERM GOAL #1   Title pt to be IND with inital HEP    Baseline intermittent    Time 4    Period Weeks    Status On-going    Target Date 08/10/20      PT SHORT TERM GOAL #2   Title pt to verbalize/ demo techinques to reduce pain and inflammation via RICE and HEP    Time 4    Period Weeks    Status Achieved    Target Date  08/10/20      PT SHORT TERM GOAL #3   Title pt to increase knee flexion to >/= 90 degrees for fucntional progression    Baseline 90    Time 4    Period Weeks    Status Achieved    Target Date 08/10/20             PT Long Term Goals - 07/13/20 1730      PT LONG TERM GOAL #1   Title pt to increase total arc knee ROM >/= 5 - 110 degrees for functional knee mobility required for ADLs    Time 12    Period Weeks    Status New    Target Date 10/05/20      PT LONG TERM GOAL #2   Title pt to increase LLE strength to >/= 4+/5 to promote knee/ hip stability with walking/ standing    Time 12    Period Weeks    Status New    Target Date 10/05/20      PT LONG TERM GOAL #3   Title pt to be able to stand and walk for >/= 1 hour with LRAD with no report limitations and max pain </= 2/10 for functonal endurance for in home and community amb    Time 12    Period Weeks    Status New    Target Date 10/05/20      PT LONG TERM GOAL #4   Title pt to be IND with all HEP given and is able to maintain and progress current LOF    Time 12    Period Weeks    Status New    Target Date 10/05/20                 Plan - 08/24/20 0809    Clinical Impression Statement Pt presents with slight increased stiffness and edema in L foot today following increased activity levels yesterday, ambulating. Focused on knee extension with improvement from 14-10 post session. Pt is slowly progressing quad activation proximal to distal, able to perform LAQ with 5# ankle weight today, but 3# for SAQ. Edu to perform sink squats to standing heel raises for quad activation until next  session.    PT Treatment/Interventions Aquatic Therapy;Cryotherapy;Electrical Stimulation;Iontophoresis 4mg /ml Dexamethasone;Moist Heat;Ultrasound;Gait training;Stair training;Functional mobility training;Therapeutic activities;Therapeutic exercise;Balance training;Neuromuscular re-education;Manual techniques;Passive range of motion;Dry  needling;Scar mobilization;Joint Manipulations;Vasopneumatic Device;Taping    PT Next Visit Plan Review HEP update PRN, manual and stretching for knee ROM, knee extension and quad strength, strengthening for gross LE, gait training as needed; standing TKE ball at wall    PT Home Exercise Plan [email or web address]    Consulted and Agree with Plan of Care Patient           Patient will benefit from skilled therapeutic intervention in order to improve the following deficits and impairments:  Abnormal gait,Decreased activity tolerance,Decreased balance,Decreased endurance,Decreased mobility,Decreased range of motion,Decreased strength,Difficulty walking,Hypomobility,Increased edema,Increased fascial restricitons,Pain  Visit Diagnosis: Chronic pain of left knee  Muscle weakness (generalized)  Other abnormalities of gait and mobility  Localized edema     Problem List Patient Active Problem List   Diagnosis Date Noted  . Status post total knee replacement, left 07/19/2020  . Total knee replacement status, right 12/14/2019  . Class 2 obesity due to excess calories without serious comorbidity with body mass index (BMI) of 39.0 to 39.9 in adult 11/05/2019  . Unilateral primary osteoarthritis, right knee 11/05/2019  . Unilateral primary osteoarthritis, left knee 11/05/2019  . Bilateral primary osteoarthritis of knee 01/25/2019    01/27/2019, PT, DPT 08/24/2020, 8:58 AM  [facility] [address] [place], [place], [place] Phone: [phone]   Fax:  [phone]  Name: Julia [name] MRN: Criss Alvine Date of Birth: 18-Apr-1966

## 2020-08-25 ENCOUNTER — Ambulatory Visit: Payer: Managed Care, Other (non HMO)

## 2020-08-29 ENCOUNTER — Ambulatory Visit: Payer: Managed Care, Other (non HMO)

## 2020-08-29 ENCOUNTER — Other Ambulatory Visit: Payer: Self-pay

## 2020-08-29 DIAGNOSIS — M25562 Pain in left knee: Secondary | ICD-10-CM

## 2020-08-29 DIAGNOSIS — R600 Localized edema: Secondary | ICD-10-CM

## 2020-08-29 DIAGNOSIS — R2689 Other abnormalities of gait and mobility: Secondary | ICD-10-CM

## 2020-08-29 DIAGNOSIS — M6281 Muscle weakness (generalized): Secondary | ICD-10-CM

## 2020-08-29 DIAGNOSIS — G8929 Other chronic pain: Secondary | ICD-10-CM

## 2020-08-29 DIAGNOSIS — M25561 Pain in right knee: Secondary | ICD-10-CM

## 2020-08-29 NOTE — Therapy (Signed)
[facility] Outpatient Rehabilitation [facility] [address] [place], [place], [address] Phone: [phone]   Fax:  [phone]  Physical Therapy Treatment  Patient Details  Name: Julia [name] MRN: [id number] Date of Birth: Jun 19, 1966 Referring Provider (PT): [name], MD   Encounter Date: 08/29/2020   PT End of Session - 08/29/20 1511    Visit Number 9    Number of Visits 25    Date for PT Re-Evaluation 10/05/20    Authorization Type [employer]    PT Start Time 1503    PT Stop Time 1545    PT Time Calculation (min) 42 min    Activity Tolerance Patient tolerated treatment well    Behavior During Therapy [facility] for tasks assessed/performed           Past Medical History:  Diagnosis Date  . Anxiety   . Arthritis    knees, hands  . Depression   . Hypertension     Past Surgical History:  Procedure Laterality Date  . ABDOMINAL HYSTERECTOMY    . KNEE ARTHROSCOPY    . 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   Procedure: RIGHT TOTAL KNEE ARTHROPLASTY;  Surgeon: Valeria Batman, MD;  Location: [facility];  Service: Orthopedics;  Laterality: Right;  . TOTAL KNEE ARTHROPLASTY Left 07/04/2020   Procedure: LEFT TOTAL KNEE ARTHROPLASTY;  Surgeon: Valeria Batman, MD;  Location: [facility];  Service: Orthopedics;  Laterality: Left;    There were no vitals filed for this visit.   Subjective Assessment - 08/29/20 1507    Subjective Pt reports she started driving again last week. She didn't take a pain pill before coming today since she drove, so her pain level is higher.    Patient Stated Goals to have no pain, to get back to regualr motion, get life back to normal    Currently in Pain? Yes    Pain Score 7     Pain Location Knee    Pain Orientation Left    Pain Descriptors / Indicators Aching    Pain Type Chronic pain;Surgical pain    Pain Onset More than a month ago              [facility] PT Assessment - 08/29/20 0001      AROM   Left Knee Extension 13    to 10   Left Knee Flexion 94      Ambulation/Gait   Gait Comments Gait training without SPC: focused on heel-to-toe gait pattern for quad activation with heel strike                         [facility] Adult PT Treatment/Exercise - 08/29/20 0001      Self-Care   RICE see pt edu      Knee/Hip Exercises: Stretches   Passive Hamstring Stretch Left;2 reps;30 seconds    Passive Hamstring Stretch Limitations strap   added slight pull toward left shoulder and slight to right to target complete hamstrings   Gastroc Stretch Left;2 reps;30 seconds    Gastroc Stretch Limitations long sitting with strap      Knee/Hip Exercises: Aerobic   Nustep L6 x 6 min LE to improve knee ROM      Knee/Hip Exercises: Standing   Heel Raises Both;1 set;10 reps    Heel Raises Limitations to toe with quad set    Functional Squat 10 reps    Functional Squat Limitations holding onto sink at counter  Knee/Hip Exercises: Supine   Short Arc Quad Sets 1 set;15 reps    Short Arc Quad Sets Limitations 3# 5" hold    Straight Leg Raises Left;1 set;15 reps      Manual Therapy   Manual Therapy Joint mobilization;Passive ROM    Joint Mobilization L patellar mobs multidirection, extension mobs grade 3 APs with tibial block and APs to femur grade 3 with intermittent superior patellar mobs   bolster under calf   Passive ROM L knee flexion/extension                  PT Education - 08/29/20 1550    Education Details focus on extension more at home, at least 1-2x/day, preferably every hour (prone knee hang, seated knee ext + osc, heel propped)    Person(s) Educated Patient    Methods Explanation;Demonstration;Tactile cues;Verbal cues    Comprehension Verbalized understanding;Returned demonstration            PT Short Term Goals - 08/17/20 1041      PT SHORT TERM GOAL #1   Title pt to be IND with inital HEP    Baseline intermittent    Time 4    Period Weeks    Status On-going    Target  Date 08/10/20      PT SHORT TERM GOAL #2   Title pt to verbalize/ demo techinques to reduce pain and inflammation via RICE and HEP    Time 4    Period Weeks    Status Achieved    Target Date 08/10/20      PT SHORT TERM GOAL #3   Title pt to increase knee flexion to >/= 90 degrees for fucntional progression    Baseline 90    Time 4    Period Weeks    Status Achieved    Target Date 08/10/20             PT Long Term Goals - 07/13/20 1730      PT LONG TERM GOAL #1   Title pt to increase total arc knee ROM >/= 5 - 110 degrees for functional knee mobility required for ADLs    Time 12    Period Weeks    Status New    Target Date 10/05/20      PT LONG TERM GOAL #2   Title pt to increase LLE strength to >/= 4+/5 to promote knee/ hip stability with walking/ standing    Time 12    Period Weeks    Status New    Target Date 10/05/20      PT LONG TERM GOAL #3   Title pt to be able to stand and walk for >/= 1 hour with LRAD with no report limitations and max pain </= 2/10 for functonal endurance for in home and community amb    Time 12    Period Weeks    Status New    Target Date 10/05/20      PT LONG TERM GOAL #4   Title pt to be IND with all HEP given and is able to maintain and progress current LOF    Time 12    Period Weeks    Status New    Target Date 10/05/20                 Plan - 08/29/20 1512    Clinical Impression Statement Pt presents with incr pain levels due to driving and not taking pain pill, but had  better tolerance to manual therapy today for extension than at previous session. AROM L knee 13-94 at session start with extension improving to 10. Pt educated to continue to focus on extension, after not stretching it that way for the last 2-3 days. She did fire VMO strongly today with SAQ and SLR. Focused on gait training without SPC and heel-to-toe patterning for incr quad fire.    PT Treatment/Interventions Aquatic Therapy;Cryotherapy;Electrical  Stimulation;Iontophoresis 4mg /ml Dexamethasone;Moist Heat;Ultrasound;Gait training;Stair training;Functional mobility training;Therapeutic activities;Therapeutic exercise;Balance training;Neuromuscular re-education;Manual techniques;Passive range of motion;Dry needling;Scar mobilization;Joint Manipulations;Vasopneumatic Device;Taping    PT Next Visit Plan Review HEP update PRN, manual and stretching for knee ROM, knee extension and quad strength, strengthening for gross LE, gait training as needed; standing TKE ball at wall    PT Home Exercise Plan [email or web address]    Consulted and Agree with Plan of Care Patient           Patient will benefit from skilled therapeutic intervention in order to improve the following deficits and impairments:  Abnormal gait,Decreased activity tolerance,Decreased balance,Decreased endurance,Decreased mobility,Decreased range of motion,Decreased strength,Difficulty walking,Hypomobility,Increased edema,Increased fascial restricitons,Pain  Visit Diagnosis: Chronic pain of left knee  Muscle weakness (generalized)  Other abnormalities of gait and mobility  Localized edema  Acute pain of right knee     Problem List Patient Active Problem List   Diagnosis Date Noted  . Status post total knee replacement, left 07/19/2020  . Total knee replacement status, right 12/14/2019  . Class 2 obesity due to excess calories without serious comorbidity with body mass index (BMI) of 39.0 to 39.9 in adult 11/05/2019  . Unilateral primary osteoarthritis, right knee 11/05/2019  . Unilateral primary osteoarthritis, left knee 11/05/2019  . Bilateral primary osteoarthritis of knee 01/25/2019    01/27/2019, PT, DPT 08/29/2020, 3:58 PM  [facility] [address] [place], [place], [place] Phone: [phone]   Fax:  [phone]  Name: Julia [name] MRN: Criss Alvine Date of Birth: [birth year]

## 2020-08-31 ENCOUNTER — Other Ambulatory Visit: Payer: Self-pay | Admitting: Orthopaedic Surgery

## 2020-08-31 NOTE — Telephone Encounter (Signed)
PW pt

## 2020-09-04 ENCOUNTER — Other Ambulatory Visit: Payer: Self-pay | Admitting: Orthopaedic Surgery

## 2020-09-04 MED ORDER — TRAMADOL HCL 50 MG PO TABS: 50.0000 mg | ORAL_TABLET | Freq: Three times a day (TID) | ORAL | 0 refills | Status: DC | PRN

## 2020-09-04 NOTE — Telephone Encounter (Signed)
Sent in tramadol

## 2020-09-04 NOTE — Telephone Encounter (Signed)
, °

## 2020-09-07 ENCOUNTER — Other Ambulatory Visit: Payer: Self-pay

## 2020-09-07 ENCOUNTER — Ambulatory Visit: Payer: Managed Care, Other (non HMO)

## 2020-09-07 ENCOUNTER — Encounter: Payer: Self-pay | Admitting: Orthopaedic Surgery

## 2020-09-07 ENCOUNTER — Ambulatory Visit (INDEPENDENT_AMBULATORY_CARE_PROVIDER_SITE_OTHER): Payer: Managed Care, Other (non HMO) | Admitting: Orthopaedic Surgery

## 2020-09-07 VITALS — Ht 69.0 in | Wt 256.0 lb

## 2020-09-07 DIAGNOSIS — Z96652 Presence of left artificial knee joint: Secondary | ICD-10-CM

## 2020-09-07 DIAGNOSIS — R600 Localized edema: Secondary | ICD-10-CM

## 2020-09-07 DIAGNOSIS — M6281 Muscle weakness (generalized): Secondary | ICD-10-CM

## 2020-09-07 DIAGNOSIS — M1712 Unilateral primary osteoarthritis, left knee: Secondary | ICD-10-CM

## 2020-09-07 DIAGNOSIS — M25562 Pain in left knee: Secondary | ICD-10-CM

## 2020-09-07 DIAGNOSIS — R2689 Other abnormalities of gait and mobility: Secondary | ICD-10-CM

## 2020-09-07 DIAGNOSIS — G8929 Other chronic pain: Secondary | ICD-10-CM

## 2020-09-07 NOTE — Progress Notes (Signed)
Office Visit Note   Patient: Julia [name]           Date of Birth: 07-20-1965           MRN: [id number] Visit Date: 09/07/2020              Requested by: Lavada Mesi, MD [address] [place],  [place] [address] PCP: Lavada Mesi, MD   Assessment & Plan: Visit Diagnoses:  1. Unilateral primary osteoarthritis, left knee   2. Status post total knee replacement, left     Plan: 2 months status post primary left total knee replacement.  Continues to go to physical therapy.  Still taking pain medicine but we have had a long discussion regarding now using tramadol.  She is fine with that.  No longer using any ambulatory aid.  Continue with the strengthening exercises and therapy and plan to check her back in a month.  She is doing very well  Follow-Up Instructions: Return in about 1 month (around 10/05/2020).   Orders:  No orders of the defined types were placed in this encounter.  No orders of the defined types were placed in this encounter.     Procedures: No procedures performed   Clinical Data: No additional findings.   Subjective: Chief Complaint  Patient presents with  . Left Knee - Follow-up    Left total knee arthroplasty 07/04/20  Patient presents today for a one month follow up on her left knee. She had a left total knee arthroplasty on 07/04/20. She is now 9 weeks out from surgery. She states that she is doing well overall. She is going to physical therapy.  HPI  Review of Systems   Objective: Vital Signs: Ht 5\' 9"  (1.753 m)   Wt 256 lb (116.1 kg)   BMI 37.80 kg/m   Physical Exam  Ortho Exam has a very small extensor lag left leg and passively I can corrected to about 5 degrees to full extension.  She flexed about 100 degrees.  No instability.  No localized areas of tenderness.  The knee was not hot warm or red.  No localized areas of tenderness.  No calf pain or distal edema  Specialty Comments:  No specialty comments available.  Imaging: No  results found.   PMFS History: Patient Active Problem List   Diagnosis Date Noted  . Status post total knee replacement, left 07/19/2020  . Total knee replacement status, right 12/14/2019  . Class 2 obesity due to excess calories without serious comorbidity with body mass index (BMI) of 39.0 to 39.9 in adult 11/05/2019  . Unilateral primary osteoarthritis, right knee 11/05/2019  . Unilateral primary osteoarthritis, left knee 11/05/2019  . Bilateral primary osteoarthritis of knee 01/25/2019   Past Medical History:  Diagnosis Date  . Anxiety   . Arthritis    knees, hands  . Depression   . Hypertension     Family History  Problem Relation Age of Onset  . Cancer Mother     Past Surgical History:  Procedure Laterality Date  . ABDOMINAL HYSTERECTOMY    . KNEE ARTHROSCOPY    . 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   Procedure: RIGHT TOTAL KNEE ARTHROPLASTY;  Surgeon: 02/13/2020, MD;  Location: [facility];  Service: Orthopedics;  Laterality: Right;  . TOTAL KNEE ARTHROPLASTY Left 07/04/2020   Procedure: LEFT TOTAL KNEE ARTHROPLASTY;  Surgeon: 07/06/2020, MD;  Location: [facility];  Service: Orthopedics;  Laterality: Left;   Social History  Occupational History  . Not on file  Tobacco Use  . Smoking status: Former Smoker    Packs/day: 1.00    Years: 15.00    Pack years: 15.00    Types: Cigarettes    Quit date: 12/02/2012    Years since quitting: 7.7  . Smokeless tobacco: Never Used  Vaping Use  . Vaping Use: Former  Substance and Sexual Activity  . Alcohol use: Not Currently  . Drug use: Never  . Sexual activity: Yes

## 2020-09-07 NOTE — Therapy (Signed)
[facility] [address] [place], [place], [address] Phone: [phone]   Fax:  [phone]  Physical Therapy Treatment/ Progress Note Reporting Period 07/13/20 to 09/07/20  See note below for Objective Data and Assessment of Progress/Goals.       Patient Details  Name: ARMINDA FOGLIO MRN: [id number] Date of Birth: [birth year] Referring Provider (PT): [name], MD   Encounter Date: 09/07/2020   PT End of Session - 09/07/20 1556    Visit Number 10    Number of Visits 25    Date for PT Re-Evaluation 10/05/20    Authorization Type [employer]    PT Start Time 1550    PT Stop Time 1630    PT Time Calculation (min) 40 min    Activity Tolerance Patient tolerated treatment well    Behavior During Therapy [facility] for tasks assessed/performed           Past Medical History:  Diagnosis Date  . Anxiety   . Arthritis    knees, hands  . Depression   . Hypertension     Past Surgical History:  Procedure Laterality Date  . ABDOMINAL HYSTERECTOMY    . KNEE ARTHROSCOPY    . 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   Procedure: RIGHT TOTAL KNEE ARTHROPLASTY;  Surgeon: Valeria Batman, MD;  Location: [facility];  Service: Orthopedics;  Laterality: Right;  . TOTAL KNEE ARTHROPLASTY Left 07/04/2020   Procedure: LEFT TOTAL KNEE ARTHROPLASTY;  Surgeon: Valeria Batman, MD;  Location: [facility];  Service: Orthopedics;  Laterality: Left;    There were no vitals filed for this visit.   Subjective Assessment - 09/07/20 1555    Subjective Pt saw her MD today, and he said everything looks good and she can start slowly on the bike for revolutions. He said her knee is looking straight.    Patient Stated Goals to have no pain, to get back to regualr motion, get life back to normal    Currently in Pain? Yes    Pain Score 4     Pain Location Knee    Pain Orientation Left    Pain Descriptors / Indicators Aching    Pain Type Chronic pain;Surgical  pain    Pain Onset More than a month ago              [facility] PT Assessment - 09/07/20 0001      AROM   Left Knee Extension 11    Left Knee Flexion 102   to 106 post stretching                        [facility] Adult PT Treatment/Exercise - 09/07/20 0001      Ambulation/Gait   Gait Comments Mild decreased weight shift on L LE, min decr heel strike, no AD      Knee/Hip Exercises: Stretches   Lobbyist 2 reps;30 seconds    Quad Stretch Limitations c strap      Knee/Hip Exercises: Aerobic   Recumbent Bike 4 min full revolutions    Nustep L6 x 5 min LE only      Knee/Hip Exercises: Standing   Heel Raises Limitations with TRX after squats x5    Theraband Level (Terminal Knee Extension) Level 4 (Blue)    Terminal Knee Extension Limitations ball at wall, blue theraband at TM rail   15x5" each   Functional Squat 15 reps    Functional Squat Limitations with TRX  Wall Squat Limitations pain in lateral R knee    SLS 2x20" ea min UE assist or 1 at counter      Manual Therapy   Soft tissue mobilization quad in thomas position off EOT                    PT Short Term Goals - 09/07/20 1701      PT SHORT TERM GOAL #1   Title pt to be IND with inital HEP    Baseline intermittent    Time 4    Period Weeks    Status Achieved    Target Date 08/10/20      PT SHORT TERM GOAL #2   Title pt to verbalize/ demo techinques to reduce pain and inflammation via RICE and HEP    Time 4    Period Weeks    Status Achieved    Target Date 08/10/20      PT SHORT TERM GOAL #3   Title pt to increase knee flexion to >/= 90 degrees for fucntional progression    Baseline 90    Time 4    Period Weeks    Status Achieved    Target Date 08/10/20             PT Long Term Goals - 09/07/20 1702      PT LONG TERM GOAL #1   Title pt to increase total arc knee ROM >/= 5 - 110 degrees for functional knee mobility required for ADLs    Baseline 10 - 106    Time 12     Period Weeks    Status On-going      PT LONG TERM GOAL #2   Title pt to increase LLE strength to >/= 4+/5 to promote knee/ hip stability with walking/ standing    Baseline 4/5    Time 12    Period Weeks    Status On-going      PT LONG TERM GOAL #3   Title pt to be able to stand and walk for >/= 1 hour with LRAD with no report limitations and max pain </= 2/10 for functonal endurance for in home and community amb    Baseline ambulating without LRAD, but not without pain of <4/10 and not for an hour    Time 12    Period Weeks    Status On-going      PT LONG TERM GOAL #4   Title pt to be IND with all HEP given and is able to maintain and progress current LOF    Time 12    Period Weeks    Status On-going                 Plan - 09/07/20 1656    Clinical Impression Statement Pt arrives from MD visit who was very pleased with her progress. Pt continues to lack ~10 degrees of knee extension, but is able to fire VMO and minimal decrease weight shift from L LE during gait without AD. She tolerated standing progressions of exercise well today, focusing on quad engagement and functional squatting, as well as introducing SLS.    PT Treatment/Interventions Aquatic Therapy;Cryotherapy;Electrical Stimulation;Iontophoresis 4mg /ml Dexamethasone;Moist Heat;Ultrasound;Gait training;Stair training;Functional mobility training;Therapeutic activities;Therapeutic exercise;Balance training;Neuromuscular re-education;Manual techniques;Passive range of motion;Dry needling;Scar mobilization;Joint Manipulations;Vasopneumatic Device;Taping    PT Next Visit Plan Review HEP update PRN, manual and stretching for knee ROM, knee extension and quad strength, strengthening for gross LE, gait training as needed; standing/functional strength  PT Home Exercise Plan [email or web address]    Consulted and Agree with Plan of Care Patient           Patient will benefit from skilled therapeutic intervention in order to improve  the following deficits and impairments:  Abnormal gait,Decreased activity tolerance,Decreased balance,Decreased endurance,Decreased mobility,Decreased range of motion,Decreased strength,Difficulty walking,Hypomobility,Increased edema,Increased fascial restricitons,Pain  Visit Diagnosis: Chronic pain of left knee  Muscle weakness (generalized)  Other abnormalities of gait and mobility  Localized edema     Problem List Patient Active Problem List   Diagnosis Date Noted  . Status post total knee replacement, left 07/19/2020  . Total knee replacement status, right 12/14/2019  . Class 2 obesity due to excess calories without serious comorbidity with body mass index (BMI) of 39.0 to 39.9 in adult 11/05/2019  . Unilateral primary osteoarthritis, right knee 11/05/2019  . Unilateral primary osteoarthritis, left knee 11/05/2019  . Bilateral primary osteoarthritis of knee 01/25/2019    Marcelline Mates, PT, DPT 09/07/2020, 5:03 PM  [facility] [address] [place], [place], [address] Phone: [phone]   Fax:  [phone]  Name: KARMIN KASPRZAK MRN: [id number] Date of Birth: [birth year]

## 2020-09-12 ENCOUNTER — Ambulatory Visit: Payer: Managed Care, Other (non HMO) | Attending: Family Medicine

## 2020-09-12 ENCOUNTER — Other Ambulatory Visit: Payer: Self-pay

## 2020-09-12 DIAGNOSIS — R2689 Other abnormalities of gait and mobility: Secondary | ICD-10-CM | POA: Diagnosis present

## 2020-09-12 DIAGNOSIS — G8929 Other chronic pain: Secondary | ICD-10-CM | POA: Insufficient documentation

## 2020-09-12 DIAGNOSIS — R600 Localized edema: Secondary | ICD-10-CM

## 2020-09-12 DIAGNOSIS — M6281 Muscle weakness (generalized): Secondary | ICD-10-CM | POA: Diagnosis present

## 2020-09-12 DIAGNOSIS — M25562 Pain in left knee: Secondary | ICD-10-CM | POA: Insufficient documentation

## 2020-09-12 NOTE — Therapy (Signed)
[facility] [address] [place], [place], [address] Phone: [phone]   Fax:  [phone]  Physical Therapy Treatment  Patient Details  Name: Julia [name] MRN: [id number] Date of Birth: 1965/11/21 Referring Provider (PT): [name], MD   Encounter Date: 09/12/2020   PT End of Session - 09/12/20 1023    Visit Number 11    Number of Visits 25    PT Start Time 1015    PT Stop Time 1100    PT Time Calculation (min) 45 min    Activity Tolerance Patient tolerated treatment well    Behavior During Therapy [facility] for tasks assessed/performed           Past Medical History:  Diagnosis Date  . Anxiety   . Arthritis    knees, hands  . Depression   . Hypertension     Past Surgical History:  Procedure Laterality Date  . ABDOMINAL HYSTERECTOMY    . KNEE ARTHROSCOPY    . 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   Procedure: RIGHT TOTAL KNEE ARTHROPLASTY;  Surgeon: Valeria Batman, MD;  Location: [facility];  Service: Orthopedics;  Laterality: Right;  . TOTAL KNEE ARTHROPLASTY Left 07/04/2020   Procedure: LEFT TOTAL KNEE ARTHROPLASTY;  Surgeon: Valeria Batman, MD;  Location: [facility];  Service: Orthopedics;  Laterality: Left;    There were no vitals filed for this visit.   Subjective Assessment - 09/12/20 1020    Subjective Pt presents with continued ache, but increased activity levels working on more walking and squats at home. She had stopped extension focus, but reports she will continue to work on that.    Patient Stated Goals to have no pain, to get back to regualr motion, get life back to normal; stand for long periods of time without pain (unloading dishwasher, vaccuming) 30 min- 1 hour    Currently in Pain? Yes    Pain Score 2     Pain Location Knee    Pain Orientation Left    Pain Descriptors / Indicators Aching    Pain Type Chronic pain;Surgical pain    Pain Onset More than a month ago              [facility]  PT Assessment - 09/12/20 0001      AROM   Left Knee Extension 12    Left Knee Flexion 105                         [facility] Adult PT Treatment/Exercise - 09/12/20 0001      Knee/Hip Exercises: Stretches   Lobbyist Limitations quad/HS in supine 5x5" end of session    Gastroc Stretch Left;2 reps;Right;1 rep;30 seconds    Gastroc Stretch Limitations standing at slant board      Knee/Hip Exercises: Aerobic   Recumbent Bike 6 min full revolutions      Knee/Hip Exercises: Standing   Heel Raises 1 set   12 reps   Heel Raises Limitations with TRX hip width stance    Theraband Level (Terminal Knee Extension) Other (comment)   black band   Terminal Knee Extension Limitations ball at wall, at TM with band 15x10" ea    Functional Squat 10 reps;2 sets;5 seconds;Other (comment)    Functional Squat Limitations with TRX   shoulder width, hip width   SLS 2x30" ea intermittent UE assist    Other Standing Knee Exercises side step with GTB below knees 2  laps                  PT Education - 09/12/20 1103    Education Details continue to stretch into extension focus    Person(s) Educated Patient    Methods Explanation    Comprehension Verbalized understanding            PT Short Term Goals - 09/07/20 1701      PT SHORT TERM GOAL #1   Title pt to be IND with inital HEP    Baseline intermittent    Time 4    Period Weeks    Status Achieved    Target Date 08/10/20      PT SHORT TERM GOAL #2   Title pt to verbalize/ demo techinques to reduce pain and inflammation via RICE and HEP    Time 4    Period Weeks    Status Achieved    Target Date 08/10/20      PT SHORT TERM GOAL #3   Title pt to increase knee flexion to >/= 90 degrees for fucntional progression    Baseline 90    Time 4    Period Weeks    Status Achieved    Target Date 08/10/20             PT Long Term Goals - 09/07/20 1702      PT LONG TERM GOAL #1   Title pt to increase total arc knee ROM  >/= 5 - 110 degrees for functional knee mobility required for ADLs    Baseline 10 - 106    Time 12    Period Weeks    Status On-going      PT LONG TERM GOAL #2   Title pt to increase LLE strength to >/= 4+/5 to promote knee/ hip stability with walking/ standing    Baseline 4/5    Time 12    Period Weeks    Status On-going      PT LONG TERM GOAL #3   Title pt to be able to stand and walk for >/= 1 hour with LRAD with no report limitations and max pain </= 2/10 for functonal endurance for in home and community amb    Baseline ambulating without LRAD, but not without pain of <4/10 and not for an hour    Time 12    Period Weeks    Status On-going      PT LONG TERM GOAL #4   Title pt to be IND with all HEP given and is able to maintain and progress current LOF    Time 12    Period Weeks    Status On-going                 Plan - 09/12/20 1024    Clinical Impression Statement Pt presents with continued extension deficit of 12 degrees today post session. She was educated on importance of continuing to focus on this at home, even if she is doing more walking and other exercise. Let pt know her MD wants her to continue to increase extension as well. Pt has decreasing pain levels and excellent tolerance to session today, with exception of lateral R knee discomfort. Initiated side stepping for lateral hip stability and control.    PT Treatment/Interventions Aquatic Therapy;Cryotherapy;Electrical Stimulation;Iontophoresis 4mg /ml Dexamethasone;Moist Heat;Ultrasound;Gait training;Stair training;Functional mobility training;Therapeutic activities;Therapeutic exercise;Balance training;Neuromuscular re-education;Manual techniques;Passive range of motion;Dry needling;Scar mobilization;Joint Manipulations;Vasopneumatic Device;Taping    PT Next Visit Plan Review HEP update PRN,  manual and stretching for knee ROM, knee extension and quad strength, strengthening for gross LE, gait training as needed;  standing/functional strength for quads and glutes, balance    PT Home Exercise Plan [email or web address]    Consulted and Agree with Plan of Care Patient           Patient will benefit from skilled therapeutic intervention in order to improve the following deficits and impairments:  Abnormal gait,Decreased activity tolerance,Decreased balance,Decreased endurance,Decreased mobility,Decreased range of motion,Decreased strength,Difficulty walking,Hypomobility,Increased edema,Increased fascial restricitons,Pain  Visit Diagnosis: Chronic pain of left knee  Muscle weakness (generalized)  Other abnormalities of gait and mobility  Localized edema     Problem List Patient Active Problem List   Diagnosis Date Noted  . Status post total knee replacement, left 07/19/2020  . Total knee replacement status, right 12/14/2019  . Class 2 obesity due to excess calories without serious comorbidity with body mass index (BMI) of 39.0 to 39.9 in adult 11/05/2019  . Unilateral primary osteoarthritis, right knee 11/05/2019  . Unilateral primary osteoarthritis, left knee 11/05/2019  . Bilateral primary osteoarthritis of knee 01/25/2019    Marcelline Mates, PT, DPT 09/12/2020, 11:07 AM  [facility] [address] [place], [place], [address] Phone: [phone]   Fax:  [phone]  Name: Julia [name] MRN: [id number] Date of Birth: 1965/10/26

## 2020-09-14 ENCOUNTER — Other Ambulatory Visit: Payer: Self-pay | Admitting: Orthopaedic Surgery

## 2020-09-14 MED ORDER — TRAMADOL HCL 50 MG PO TABS: 50.0000 mg | ORAL_TABLET | Freq: Three times a day (TID) | ORAL | 0 refills | Status: DC | PRN

## 2020-09-14 NOTE — Telephone Encounter (Signed)
Sent in tramadol

## 2020-09-15 NOTE — Telephone Encounter (Signed)
Sent in tramadol-please call pt to be sure she is aware of refill-thanks

## 2020-09-22 ENCOUNTER — Encounter: Payer: Self-pay | Admitting: Orthopaedic Surgery

## 2020-09-22 ENCOUNTER — Other Ambulatory Visit: Payer: Self-pay | Admitting: Orthopaedic Surgery

## 2020-09-22 MED ORDER — TRAMADOL HCL 50 MG PO TABS: 50.0000 mg | ORAL_TABLET | Freq: Three times a day (TID) | ORAL | 0 refills | Status: DC | PRN

## 2020-09-22 NOTE — Telephone Encounter (Signed)
sent 

## 2020-09-26 ENCOUNTER — Ambulatory Visit: Payer: Managed Care, Other (non HMO)

## 2020-09-27 ENCOUNTER — Encounter: Payer: Self-pay | Admitting: Physical Therapy

## 2020-09-27 ENCOUNTER — Other Ambulatory Visit: Payer: Self-pay

## 2020-09-27 ENCOUNTER — Ambulatory Visit: Payer: Managed Care, Other (non HMO) | Admitting: Physical Therapy

## 2020-09-27 DIAGNOSIS — M6281 Muscle weakness (generalized): Secondary | ICD-10-CM

## 2020-09-27 DIAGNOSIS — M25562 Pain in left knee: Secondary | ICD-10-CM | POA: Diagnosis not present

## 2020-09-27 DIAGNOSIS — G8929 Other chronic pain: Secondary | ICD-10-CM

## 2020-09-27 DIAGNOSIS — R600 Localized edema: Secondary | ICD-10-CM

## 2020-09-27 DIAGNOSIS — R2689 Other abnormalities of gait and mobility: Secondary | ICD-10-CM

## 2020-09-27 NOTE — Therapy (Signed)
[facility] Outpatient Rehabilitation [facility] [address] [place], [place], [address] Phone: [phone]   Fax:  [phone]  Physical Therapy Treatment  Patient Details  Name:  JON MRN: [id number] Date of Birth: January 20, 1966 Referring Provider (PT): Norlene , MD   Encounter Date: 09/27/2020   PT End of Session - 09/27/20 1545    Visit Number 12    Number of Visits 25    Date for PT Re-Evaluation 10/05/20    Authorization Type [employer]    PT Start Time 1532    PT Stop Time 1613    PT Time Calculation (min) 41 min    Activity Tolerance Patient tolerated treatment well    Behavior During Therapy [facility] for tasks assessed/performed           Past Medical History:  Diagnosis Date  . Anxiety   . Arthritis    knees, hands  . Depression   . Hypertension     Past Surgical History:  Procedure Laterality Date  . ABDOMINAL HYSTERECTOMY    . KNEE ARTHROSCOPY    . 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   Procedure: RIGHT TOTAL KNEE ARTHROPLASTY;  Surgeon: Valeria Batman, MD;  Location: [facility];  Service: Orthopedics;  Laterality: Right;  . TOTAL KNEE ARTHROPLASTY Left 07/04/2020   Procedure: LEFT TOTAL KNEE ARTHROPLASTY;  Surgeon: Valeria Batman, MD;  Location: [facility];  Service: Orthopedics;  Laterality: Left;    There were no vitals filed for this visit.   Subjective Assessment - 09/27/20 1535    Subjective Patient reports she is doing well. Nothing new going on. States her right knee is being weird. Patient does report her knee bending is doing well but straightening is slow.    How long can you stand comfortably? Maybe 15 minutes before leaning on things    Patient Stated Goals to have no pain, to get back to regualr motion, get life back to normal; stand for long periods of time without pain (unloading dishwasher, vaccuming) 30 min- 1 hour    Pain Score 6     Pain Location Knee    Pain Orientation Left    Pain Descriptors / Indicators  --   "arthritis pain"   Pain Type Chronic pain;Surgical pain    Pain Onset More than a month ago    Pain Frequency Constant              [facility] PT Assessment - 09/27/20 0001      Assessment   Medical Diagnosis Left TKA    Referring Provider (PT) Norlene , MD      AROM   Left Knee Extension 8    Left Knee Flexion 109                         [facility] Adult PT Treatment/Exercise - 09/27/20 0001      Exercises   Exercises Knee/Hip      Knee/Hip Exercises: Stretches   Quad Stretch 3 reps;30 seconds    Quad Stretch Limitations thomas edge of table    Gastroc Stretch 2 reps;30 seconds    Gastroc Stretch Limitations slant board      Knee/Hip Exercises: Standing   Lateral Step Up 2 sets;10 reps    Lateral Step Up Limitations 6" box    Forward Step Up 2 sets;10 reps    Forward Step Up Limitations 6" box    Functional Squat 2 sets;10 reps    Functional  Squat Limitations TRX      Knee/Hip Exercises: Supine   Heel Prop for Knee Extension 3 minutes    Heel Prop for Knee Extension Weight (lbs) 5      Manual Therapy   Manual Therapy Joint mobilization;Passive ROM;Soft tissue mobilization    Joint Mobilization Left supine tibiofemoral mobs and rotation mobs at various ranges of knee position    Soft tissue mobilization Roller to quad in thomas stretch    Passive ROM Knee flexion and extension                  PT Education - 09/27/20 1538    Education Details HEP, supine/seated heel prop with weight vs. prone    Person(s) Educated Patient    Methods Explanation    Comprehension Verbalized understanding;Need further instruction            PT Short Term Goals - 09/07/20 1701      PT SHORT TERM GOAL #1   Title pt to be IND with inital HEP    Baseline intermittent    Time 4    Period Weeks    Status Achieved    Target Date 08/10/20      PT SHORT TERM GOAL #2   Title pt to verbalize/ demo techinques to reduce pain and inflammation via RICE  and HEP    Time 4    Period Weeks    Status Achieved    Target Date 08/10/20      PT SHORT TERM GOAL #3   Title pt to increase knee flexion to >/= 90 degrees for fucntional progression    Baseline 90    Time 4    Period Weeks    Status Achieved    Target Date 08/10/20             PT Long Term Goals - 09/07/20 1702      PT LONG TERM GOAL #1   Title pt to increase total arc knee ROM >/= 5 - 110 degrees for functional knee mobility required for ADLs    Baseline 10 - 106    Time 12    Period Weeks    Status On-going      PT LONG TERM GOAL #2   Title pt to increase LLE strength to >/= 4+/5 to promote knee/ hip stability with walking/ standing    Baseline 4/5    Time 12    Period Weeks    Status On-going      PT LONG TERM GOAL #3   Title pt to be able to stand and walk for >/= 1 hour with LRAD with no report limitations and max pain </= 2/10 for functonal endurance for in home and community amb    Baseline ambulating without LRAD, but not without pain of <4/10 and not for an hour    Time 12    Period Weeks    Status On-going      PT LONG TERM GOAL #4   Title pt to be IND with all HEP given and is able to maintain and progress current LOF    Time 12    Period Weeks    Status On-going                 Plan - 09/27/20 1553    Clinical Impression Statement Patient tolerated therapy well with no adverse effects. Therapy focused on continuing to improve knee motion and progressing strength. Patient does exhibit improved knee flexion  and extension this visit but continues to be limited overall, and she demonstrates greater difficulty with standing tasks such as step-ups on the left. Patient would benefit from continued skilled PT to progress knee mobility and strength in order to reduce pain, improve walking and maximize functional level.    PT Treatment/Interventions Aquatic Therapy;Cryotherapy;Electrical Stimulation;Iontophoresis 4mg /ml Dexamethasone;Moist  Heat;Ultrasound;Gait training;Stair training;Functional mobility training;Therapeutic activities;Therapeutic exercise;Balance training;Neuromuscular re-education;Manual techniques;Passive range of motion;Dry needling;Scar mobilization;Joint Manipulations;Vasopneumatic Device;Taping    PT Next Visit Plan Review HEP update PRN, manual and stretching for knee ROM, knee extension and quad strength, strengthening for gross LE, gait training as needed; standing/functional strength for quads and glutes, balance    PT Home Exercise Plan [email or web address]    Consulted and Agree with Plan of Care Patient           Patient will benefit from skilled therapeutic intervention in order to improve the following deficits and impairments:  Abnormal gait,Decreased activity tolerance,Decreased balance,Decreased endurance,Decreased mobility,Decreased range of motion,Decreased strength,Difficulty walking,Hypomobility,Increased edema,Increased fascial restricitons,Pain  Visit Diagnosis: Chronic pain of left knee  Muscle weakness (generalized)  Other abnormalities of gait and mobility  Localized edema     Problem List Patient Active Problem List   Diagnosis Date Noted  . Status post total knee replacement, left 07/19/2020  . Total knee replacement status, right 12/14/2019  . Class 2 obesity due to excess calories without serious comorbidity with body mass index (BMI) of 39.0 to 39.9 in adult 11/05/2019  . Unilateral primary osteoarthritis, right knee 11/05/2019  . Unilateral primary osteoarthritis, left knee 11/05/2019  . Bilateral primary osteoarthritis of knee 01/25/2019    01/27/2019, PT, DPT, LAT, ATC 09/27/20  4:15 PM Phone: [phone] Fax: [phone]   [facility] Outpatient Rehabilitation [facility] [address] [place], [place], [place] Phone: [phone]   Fax:  [phone]  Name: MIAROSE LIPPERT MRN: Criss Alvine Date of Birth: 08/30/1965

## 2020-10-02 ENCOUNTER — Other Ambulatory Visit: Payer: Self-pay | Admitting: Orthopaedic Surgery

## 2020-10-02 MED ORDER — TRAMADOL HCL 50 MG PO TABS
50.0000 mg | ORAL_TABLET | Freq: Three times a day (TID) | ORAL | 0 refills | Status: DC | PRN
Start: 2020-10-02 — End: 2022-09-09

## 2020-10-02 NOTE — Telephone Encounter (Signed)
sent 

## 2020-10-03 NOTE — Telephone Encounter (Signed)
Not sure what is happening with her prescriptions-seems like it is a problem every time I refill. Please call the patient-thanks

## 2020-10-05 ENCOUNTER — Encounter: Payer: Self-pay | Admitting: Orthopaedic Surgery

## 2020-10-05 ENCOUNTER — Encounter: Payer: Self-pay | Admitting: Physical Therapy

## 2020-10-05 ENCOUNTER — Other Ambulatory Visit: Payer: Self-pay

## 2020-10-05 ENCOUNTER — Ambulatory Visit: Payer: Managed Care, Other (non HMO) | Admitting: Physical Therapy

## 2020-10-05 ENCOUNTER — Ambulatory Visit (INDEPENDENT_AMBULATORY_CARE_PROVIDER_SITE_OTHER): Payer: Managed Care, Other (non HMO) | Admitting: Orthopaedic Surgery

## 2020-10-05 VITALS — Ht 69.0 in | Wt 256.0 lb

## 2020-10-05 DIAGNOSIS — Z96652 Presence of left artificial knee joint: Secondary | ICD-10-CM

## 2020-10-05 DIAGNOSIS — M25562 Pain in left knee: Secondary | ICD-10-CM | POA: Diagnosis not present

## 2020-10-05 DIAGNOSIS — R2689 Other abnormalities of gait and mobility: Secondary | ICD-10-CM

## 2020-10-05 DIAGNOSIS — R600 Localized edema: Secondary | ICD-10-CM

## 2020-10-05 DIAGNOSIS — G8929 Other chronic pain: Secondary | ICD-10-CM

## 2020-10-05 DIAGNOSIS — M6281 Muscle weakness (generalized): Secondary | ICD-10-CM

## 2020-10-05 NOTE — Progress Notes (Signed)
Office Visit Note   Patient: Julia [name]           Date of Birth: 06-09-1966           MRN: [id number] Visit Date: 10/05/2020              Requested by: Lavada Mesi, MD [address] [place],  [place] [address] PCP: Lavada Mesi, MD   Assessment & Plan: Visit Diagnoses:  1. Status post total knee replacement, left     Plan: Just over 3 months status post primary left total knee replacement doing well.  Taking tramadol and decreasing dosages.  No chest pain fever.  No use of ambulatory aid.  Continue with exercises and strengthening and return in 3 months.  We will continue to prescribe tramadol for 1 more month on decreasing basis  Follow-Up Instructions: Return in about 3 months (around 01/05/2021).   Orders:  No orders of the defined types were placed in this encounter.  No orders of the defined types were placed in this encounter.     Procedures: No procedures performed   Clinical Data: No additional findings.   Subjective: Chief Complaint  Patient presents with  . Left Knee - Follow-up    Left total knee arthroplasty 07/04/20  Patient presents today for a one month follow up on her left knee. She had a left total knee arthroplasty on 07/04/20. She is now 3 months out from surgery. She is going to physical therapy once weekly. She is taking tramadol for pain relief.  HPI  Review of Systems   Objective: Vital Signs: Ht 5\' 9"  (1.753 m)   Wt 256 lb (116.1 kg)   BMI 37.80 kg/m   Physical Exam  Ortho Exam left knee was not hot warm or red.  Lacks just a few degrees to full extension but functionally she feels like it is fully extended.  She has full extension and in her right total knee replacement.  Flexion about 100 degrees similar to the right knee.  No instability.  No lateral areas of pain.  No calf pain.  Little bit of swelling of her left ankle compared to her right but motor exam intact.  Specialty Comments:  No specialty comments  available.  Imaging: No results found.   PMFS History: Patient Active Problem List   Diagnosis Date Noted  . Status post total knee replacement, left 07/19/2020  . Total knee replacement status, right 12/14/2019  . Class 2 obesity due to excess calories without serious comorbidity with body mass index (BMI) of 39.0 to 39.9 in adult 11/05/2019  . Unilateral primary osteoarthritis, right knee 11/05/2019  . Unilateral primary osteoarthritis, left knee 11/05/2019  . Bilateral primary osteoarthritis of knee 01/25/2019   Past Medical History:  Diagnosis Date  . Anxiety   . Arthritis    knees, hands  . Depression   . Hypertension     Family History  Problem Relation Age of Onset  . Cancer Mother     Past Surgical History:  Procedure Laterality Date  . ABDOMINAL HYSTERECTOMY    . KNEE ARTHROSCOPY    . 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   Procedure: RIGHT TOTAL KNEE ARTHROPLASTY;  Surgeon: 02/13/2020, MD;  Location: [facility];  Service: Orthopedics;  Laterality: Right;  . TOTAL KNEE ARTHROPLASTY Left 07/04/2020   Procedure: LEFT TOTAL KNEE ARTHROPLASTY;  Surgeon: 07/06/2020, MD;  Location: [facility];  Service: Orthopedics;  Laterality: Left;   Social History  Occupational History  . Not on file  Tobacco Use  . Smoking status: Former Smoker    Packs/day: 1.00    Years: 15.00    Pack years: 15.00    Types: Cigarettes    Quit date: 12/02/2012    Years since quitting: 7.8  . Smokeless tobacco: Never Used  Vaping Use  . Vaping Use: Former  Substance and Sexual Activity  . Alcohol use: Not Currently  . Drug use: Never  . Sexual activity: Yes

## 2020-10-05 NOTE — Therapy (Addendum)
[place], [place], [address] Phone: [phone]   Fax:  [phone]  Physical Therapy Treatment/ERO / Discharge  Patient Details  Name: Julia [name] MRN: [id number] Date of Birth: 26-Mar-1966 Referring Provider (PT): Joni Fears, MD   Encounter Date: 10/05/2020   PT End of Session - 10/05/20 1139     Visit Number 13    Number of Visits 17    Date for PT Re-Evaluation 11/02/20    Authorization Type [employer]    PT Start Time 1132    PT Stop Time 1217    PT Time Calculation (min) 45 min    Activity Tolerance Patient tolerated treatment well    Behavior During Therapy [facility] for tasks assessed/performed             Past Medical History:  Diagnosis Date  . Anxiety   . Arthritis    knees, hands  . Depression   . Hypertension     Past Surgical History:  Procedure Laterality Date  . ABDOMINAL HYSTERECTOMY    . KNEE ARTHROSCOPY    . 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   Procedure: RIGHT TOTAL KNEE ARTHROPLASTY;  Surgeon: Garald Balding, MD;  Location: [facility];  Service: Orthopedics;  Laterality: Right;  . TOTAL KNEE ARTHROPLASTY Left 07/04/2020   Procedure: LEFT TOTAL KNEE ARTHROPLASTY;  Surgeon: Garald Balding, MD;  Location: [facility];  Service: Orthopedics;  Laterality: Left;    There were no vitals filed for this visit.   Subjective Assessment - 10/05/20 1135     Subjective I'm walking about a half mile now. Exercises are going well. My L knee feels like a swollen bowling ball at times.    Patient Stated Goals to have no pain, to get back to regualr motion, get life back to normal; stand for long periods of time without pain (unloading dishwasher, vaccuming) 30 min- 1 hour    Currently in Pain? Yes    Pain Score 6     Pain Location Knee    Pain Orientation Left    Pain Descriptors / Indicators --   'like pulling around bowling balls'   Pain Type Chronic pain;Surgical pain    Pain  Onset More than a month ago    Pain Frequency Constant                  [facility] PT Assessment - 10/05/20 0001       Assessment   Medical Diagnosis Left TKA    Referring Provider (PT) Joni Fears, MD      Precautions   Precautions None      Restrictions   Weight Bearing Restrictions No      Balance Screen   Has the patient fallen in the past 6 months No      Prior Function   Level of Independence Independent      Observation/Other Assessments   Focus on Therapeutic Outcomes (FOTO)  NA - not set up      Strength   Overall Strength Comments L LE strength 5/5 except hip extension 4+/5                                        [facility] Adult PT Treatment/Exercise - 10/05/20 0001       Self-Care   Self-Care Other Self-Care Comments    Other Self-Care Comments  long term goal assessment      Knee/Hip Exercises: Stretches   Sports administrator 3 reps;30 seconds    Quad Stretch Limitations thomas edge of table      Knee/Hip Exercises: Aerobic   Nustep L6 x 5 min LE only   seat further away to promote TKE     Knee/Hip Exercises: Standing   Theraband Level (Terminal Knee Extension) Level 4 (Blue)    Terminal Knee Extension Limitations 10" x10 with blue band, UE support    Forward Step Up 2 sets;10 reps    Forward Step Up Limitations 6" box, focusing on TKE    Step Down Hand Hold: 1;Step Height: 2";10 reps    Step Down Limitations retro step down    Functional Squat 2 sets;10 reps    Functional Squat Limitations TRX      Manual Therapy   Manual Therapy Joint mobilization    Joint Mobilization L patellar superior mobs, ER and AP tibiofemoral mobs                          PT Education - 10/05/20 1348     Education Details POC and HEP update    Person(s) Educated Patient    Methods Explanation;Demonstration;Tactile cues;Handout    Comprehension Verbalized understanding;Returned demonstration;Need further instruction              PT Short  Term Goals - 09/07/20 1701       PT SHORT TERM GOAL #1   Title pt to be IND with inital HEP    Baseline intermittent    Time 4    Period Weeks    Status Achieved    Target Date 08/10/20      PT SHORT TERM GOAL #2   Title pt to verbalize/ demo techinques to reduce pain and inflammation via RICE and HEP    Time 4    Period Weeks    Status Achieved    Target Date 08/10/20      PT SHORT TERM GOAL #3   Title pt to increase knee flexion to >/= 90 degrees for fucntional progression    Baseline 90    Time 4    Period Weeks    Status Achieved    Target Date 08/10/20                PT Long Term Goals - 10/05/20 1355       PT LONG TERM GOAL #1   Title pt to increase total arc knee ROM >/= 5 - 110 degrees for functional knee mobility required for ADLs    Baseline not assessed this visit    Time 4    Period Weeks    Status On-going    Target Date 11/02/20      PT LONG TERM GOAL #2   Title pt to increase LLE strength to >/= 4+/5 to promote knee/ hip stability with walking/ standing    Baseline 5/5 except hip extension 4+/5    Time 12    Period Weeks    Status On-going    Target Date 11/02/20      PT LONG TERM GOAL #3   Title pt to be able to stand and walk for >/= 1 hour with LRAD with no report limitations and max pain </= 2/10 for functonal endurance for in home and community amb    Baseline ambulating without LRAD, onyl for half a mile and baseline pain at  6/10    Time 4    Period Weeks    Status On-going    Target Date 11/02/20      PT LONG TERM GOAL #4   Title pt to be IND with all HEP given and is able to maintain and progress current LOF    Time 4    Period Weeks    Status On-going    Target Date 11/02/20                        Plan - 10/05/20 1359     Clinical Impression Statement Pt tolerated tx well with no adverse effects. TKE focused on this session through various manual as well as standing exercises. General LE CKC strength also  progressed this session. All of pts long term goals are still ongoing/have not met, so POC extended to 1x/week for the next 4 weeks to increase LE strength and increase TKE as well as full knee flexion. POC will also focus on increasing tolerance with standing activity/walking through various exercise.    Personal Factors and Comorbidities Comorbidity 3+    Comorbidities PMH: Rt TKA 12/14/19, L TKA on 07/04/2020 anx, dep, OA    Examination-Activity Limitations Stairs;Squat;Bend;Stand;Lift;Locomotion Level    Examination-Participation Restrictions Community Activity;Shop    Stability/Clinical Decision Making Evolving/Moderate complexity    Clinical Decision Making Moderate    Rehab Potential Good    PT Frequency 1x / week    PT Duration 4 weeks    PT Treatment/Interventions Aquatic Therapy;Cryotherapy;Electrical Stimulation;Iontophoresis 33m/ml Dexamethasone;Moist Heat;Ultrasound;Gait training;Stair training;Functional mobility training;Therapeutic activities;Therapeutic exercise;Balance training;Neuromuscular re-education;Manual techniques;Passive range of motion;Dry needling;Scar mobilization;Joint Manipulations;Vasopneumatic Device;Taping    PT Next Visit Plan update long term goal #1, Review HEP update PRN, manual and stretching for knee ROM, knee extension and quad strength, strengthening for gross LE, gait training as needed; standing/functional strength for quads and glutes, balance    PT Home Exercise Plan [email or web address]    Consulted and Agree with Plan of Care Patient             Patient will benefit from skilled therapeutic intervention in order to improve the following deficits and impairments:  Abnormal gait,Decreased activity tolerance,Decreased balance,Decreased endurance,Decreased mobility,Decreased range of motion,Decreased strength,Difficulty walking,Hypomobility,Increased edema,Increased fascial restricitons,Pain  Visit Diagnosis: Chronic pain of left knee  Muscle weakness  (generalized)  Other abnormalities of gait and mobility  Localized edema      Problem List Patient Active Problem List   Diagnosis Date Noted  . Status post total knee replacement, left 07/19/2020  . Total knee replacement status, right 12/14/2019  . Class 2 obesity due to excess calories without serious comorbidity with body mass index (BMI) of 39.0 to 39.9 in adult 11/05/2019  . Unilateral primary osteoarthritis, right knee 11/05/2019  . Unilateral primary osteoarthritis, left knee 11/05/2019  . Bilateral primary osteoarthritis of knee 01/25/2019    CCaleb Popp SPT 10/05/2020, 4:59 PM  [facility] [place] [address]Phone: [phone]  Fax:  [phone] Name: Julia KIRSTENMRN: [id number] of Birth: [birth year]  PHYSICAL THERAPY DISCHARGE SUMMARY  Visits from Start of Care: 13  Current functional level related to goals / functional outcomes: See above   Remaining deficits: See above   Education / Equipment: HEP  Plan: Patient agrees to discharge.  Patient goals were not met. Patient is being discharged due to the patient's request.  ?????    CHilda Blades  PT, DPT, LAT, ATC 11/13/20  3:27 PM Phone: [phone] Fax: [phone]

## 2020-10-05 NOTE — Patient Instructions (Signed)
Access Code: [email or web address] URL: [email or web address] Date: 10/05/2020 Prepared by: Jeri Cos  Exercises  Supine Quad Set - 1 x daily - 7 x weekly - 10 reps - 5 seconds hold Supine Knee Extension Strengthening - 1 x daily - 7 x weekly - 2 sets - 10 reps Active Straight Leg Raise with Quad Set - 1 x daily - 7 x weekly - 2 sets - 10 reps Seated Hamstring Stretch - 1 x daily - 7 x weekly - 2 reps - 2 sets - 30 hold Supine Heel Slide with Strap - 1 x daily - 7 x weekly - 10 reps - 2 sets - 5 seconds hold Seated Heel Slide - 1 x daily - 7 x weekly - 2 sets - 10 reps Supine Bridge - 1 x daily - 7 x weekly - 2 sets - 10 reps - 3 hold Sidelying Hip Abduction - 1 x daily - 7 x weekly - 2 sets - 10 reps - 3 hold Squat with Counter Support - 1 x daily - 7 x weekly - 2 sets - 10 reps Prone Knee Extension with Ankle Weight - 1-3 x daily - 7 x weekly - 3-5 minutes hold Prone Quadriceps Set with Towel Roll - 2-5 x daily - 7 x weekly - 2 sets - 10 reps - 5-10 seconds hold Prone Quadriceps Set - 2-5 x daily - 7 x weekly - 2 sets - 10 reps - 5-10 seconds hold Standing Terminal Knee Extension with Resistance - 1 x daily - 7 x weekly - 3 sets - 10 reps

## 2020-10-09 ENCOUNTER — Ambulatory Visit: Payer: Managed Care, Other (non HMO)

## 2020-10-09 ENCOUNTER — Telehealth: Payer: Self-pay

## 2020-10-09 NOTE — Telephone Encounter (Signed)
PT called and left voicemail regarding patient's missed appointment at 10:00am this morning. Reviewed attendance policy, provided information regarding next scheduled appointment, and advised patient to call to re-schedule for sometime this week if she is able to.  Rhea Bleacher, PT, DPT 10/09/20 10:26 AM

## 2020-10-11 ENCOUNTER — Other Ambulatory Visit: Payer: Self-pay | Admitting: Orthopaedic Surgery

## 2020-10-11 ENCOUNTER — Encounter: Payer: Self-pay | Admitting: Orthopaedic Surgery

## 2020-10-11 MED ORDER — TRAMADOL HCL 50 MG PO TABS: 50.0000 mg | ORAL_TABLET | Freq: Two times a day (BID) | ORAL | 0 refills | Status: DC | PRN

## 2020-10-11 NOTE — Telephone Encounter (Signed)
sent 

## 2020-10-11 NOTE — Telephone Encounter (Signed)
Please check with pharmacy re denial

## 2020-10-19 ENCOUNTER — Ambulatory Visit: Payer: Managed Care, Other (non HMO) | Admitting: Physical Therapy

## 2020-10-20 ENCOUNTER — Other Ambulatory Visit: Payer: Self-pay | Admitting: Orthopaedic Surgery

## 2020-10-20 ENCOUNTER — Encounter: Payer: Self-pay | Admitting: Orthopaedic Surgery

## 2020-10-20 MED ORDER — TRAMADOL HCL 50 MG PO TABS: 50.0000 mg | ORAL_TABLET | Freq: Two times a day (BID) | ORAL | 0 refills | Status: DC | PRN

## 2020-10-20 NOTE — Telephone Encounter (Signed)
Sent in

## 2020-10-26 ENCOUNTER — Ambulatory Visit: Payer: Managed Care, Other (non HMO) | Admitting: Physical Therapy

## 2020-11-01 ENCOUNTER — Ambulatory Visit: Payer: Managed Care, Other (non HMO) | Admitting: Physical Therapy

## 2020-11-06 ENCOUNTER — Encounter: Payer: Self-pay | Admitting: Orthopaedic Surgery

## 2020-11-06 MED ORDER — TRAMADOL HCL 50 MG PO TABS: 50.0000 mg | ORAL_TABLET | Freq: Three times a day (TID) | ORAL | 0 refills | Status: DC | PRN

## 2020-12-09 ENCOUNTER — Other Ambulatory Visit: Payer: Self-pay | Admitting: Family Medicine

## 2021-01-09 ENCOUNTER — Encounter: Payer: Self-pay | Admitting: Orthopaedic Surgery

## 2021-01-09 ENCOUNTER — Ambulatory Visit (INDEPENDENT_AMBULATORY_CARE_PROVIDER_SITE_OTHER): Payer: Self-pay | Admitting: Orthopaedic Surgery

## 2021-01-09 ENCOUNTER — Other Ambulatory Visit: Payer: Self-pay

## 2021-01-09 VITALS — Ht 69.0 in | Wt 256.0 lb

## 2021-01-09 DIAGNOSIS — Z96651 Presence of right artificial knee joint: Secondary | ICD-10-CM

## 2021-01-09 DIAGNOSIS — M170 Bilateral primary osteoarthritis of knee: Secondary | ICD-10-CM

## 2021-01-09 DIAGNOSIS — Z96652 Presence of left artificial knee joint: Secondary | ICD-10-CM

## 2021-01-09 NOTE — Progress Notes (Signed)
Office Visit Note   Patient: Julia [name]           Date of Birth: 1966-03-01           MRN: [id number] Visit Date: 01/09/2021              Requested by: Lavada Mesi, MD [address] [place],  [place] [address] PCP: Lavada Mesi, MD   Assessment & Plan: Visit Diagnoses:  1. Bilateral primary osteoarthritis of knee   2. Status post total knee replacement, left   3. Total knee replacement status, right     Plan: Just over 6 months status post primary left total knee replacement in approximately a year status post primary right total knee replacement.  Doing well.  Not taking any pain medicine.  Continues to exercise with an exercise bike and feels like she is doing quite well.  She is very happy with the results.  Full knee extension and flexed over 100 degrees.  There is a little opening medially in the left knee with a valgus stress and minimally on the right.  I think she is has early stretching of the collateral ligaments but at this point is asymptomatic.  I think with continued exercises she will do well.  She is walking without a limp or any ambulatory aid.  We will plan to see him in 6 months  Follow-Up Instructions: Return in about 6 months (around 07/11/2021).   Orders:  No orders of the defined types were placed in this encounter.  No orders of the defined types were placed in this encounter.     Procedures: No procedures performed   Clinical Data: No additional findings.   Subjective: Chief Complaint  Patient presents with   Left Knee - Follow-up    Left total knee arthroplasty 07/04/20  Patient presents today for a three month follow up on her left knee. She had a left total knee arthroplasty on 07/04/20. She is now 6 months out from surgery. She is doing well. Taking Aleve as needed.   HPI  Review of Systems   Objective: Vital Signs: Ht 5\' 9"  (1.753 m)   Wt 256 lb (116.1 kg)   BMI 37.80 kg/m   Physical Exam Constitutional:      Appearance:  She is well-developed.  Eyes:     Pupils: Pupils are equal, round, and reactive to light.  Pulmonary:     Effort: Pulmonary effort is normal.  Skin:    General: Skin is warm and dry.  Neurological:     Mental Status: She is alert and oriented to person, place, and time.  Psychiatric:        Behavior: Behavior normal.    Ortho Exam right total knee incision healing without problem.  No effusion.  Open just a little bit medially and laterally the with varus and valgus stressing.  No localized areas of tenderness.  No distal edema.  Full extension and flexed over 100 degrees.  No popliteal pain.  Neurologically intact.  Left knee also with excellent healing of the incision.  Opens a little more medially with a valgus stress but MCL appears intact with a good endpoint.  It is a little more loose than it was in the past and I think it still a matter of strengthening her quads.  Neurologically intact  Specialty Comments:  No specialty comments available.  Imaging: No results found.   PMFS History: Patient Active Problem List   Diagnosis Date Noted  Status post total knee replacement, left 07/19/2020   Total knee replacement status, right 12/14/2019   Class 2 obesity due to excess calories without serious comorbidity with body mass index (BMI) of 39.0 to 39.9 in adult 11/05/2019   Unilateral primary osteoarthritis, right knee 11/05/2019   Unilateral primary osteoarthritis, left knee 11/05/2019   Bilateral primary osteoarthritis of knee 01/25/2019   Past Medical History:  Diagnosis Date   Anxiety    Arthritis    knees, hands   Depression    Hypertension     Family History  Problem Relation Age of Onset   Cancer Mother     Past Surgical History:  Procedure Laterality Date   ABDOMINAL HYSTERECTOMY     KNEE ARTHROSCOPY     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   Procedure: RIGHT TOTAL KNEE ARTHROPLASTY;  Surgeon: Valeria Batman, MD;  Location: [facility];  Service:  Orthopedics;  Laterality: Right;   TOTAL KNEE ARTHROPLASTY Left 07/04/2020   Procedure: LEFT TOTAL KNEE ARTHROPLASTY;  Surgeon: Valeria Batman, MD;  Location: [facility];  Service: Orthopedics;  Laterality: Left;   Social History   Occupational History   Not on file  Tobacco Use   Smoking status: Former    Packs/day: 1.00    Years: 15.00    Pack years: 15.00    Types: Cigarettes    Quit date: 12/02/2012    Years since quitting: 8.1   Smokeless tobacco: Never  Vaping Use   Vaping Use: Former  Substance and Sexual Activity   Alcohol use: Not Currently   Drug use: Never   Sexual activity: Yes

## 2021-05-01 ENCOUNTER — Encounter: Payer: Self-pay | Admitting: Orthopaedic Surgery

## 2021-08-09 ENCOUNTER — Encounter: Payer: Self-pay | Admitting: Orthopaedic Surgery

## 2021-09-25 ENCOUNTER — Ambulatory Visit: Payer: Self-pay | Admitting: Orthopaedic Surgery

## 2021-09-26 ENCOUNTER — Ambulatory Visit: Payer: BC Managed Care – PPO | Admitting: Orthopaedic Surgery

## 2021-10-11 ENCOUNTER — Encounter: Payer: Self-pay | Admitting: Orthopaedic Surgery

## 2021-10-11 ENCOUNTER — Ambulatory Visit (INDEPENDENT_AMBULATORY_CARE_PROVIDER_SITE_OTHER): Payer: BC Managed Care – PPO | Admitting: Orthopaedic Surgery

## 2021-10-11 DIAGNOSIS — Z96653 Presence of artificial knee joint, bilateral: Secondary | ICD-10-CM

## 2021-10-11 NOTE — Progress Notes (Signed)
? ?Office Visit Note ?  ?Patient: Julia [name]           ?Date of Birth: 05/05/1966           ?MRN: [id number] ?Visit Date: 10/11/2021 ?             ?Requested by: Lavada Mesi, MD ?[address] ?[address] ?[place],  [place] [address] ?PCP: Lavada Mesi, MD ? ? ?Assessment & Plan: ?Visit Diagnoses: Bilateral knee pain ? ?Plan: Pleasant 56 year old woman who is now 6 months and a year status post bilateral total knee replacements.  The right knee she had first.  She reports other than her knees feel a little heavy and she gets some painless clicking she is doing well.  She feels her left knee is a bit stronger than her right.  She is very pleased with her results.  We emphasized the importance of maintaining good quadricep strength.  This will decrease the clicking she hears and keeps her muscle strong.  She may follow-up with [place] at any time.  We also talked her about being very careful if she gets any scratches or cuts on her legs to treat them aggressively ? ?Follow-Up Instructions: No follow-ups on file.  ? ?Orders:  ?No orders of the defined types were placed in this encounter. ? ?No orders of the defined types were placed in this encounter. ? ? ? ? Procedures: ?No procedures performed ? ? ?Clinical Data: ?No additional findings. ? ? ?Subjective: ?No chief complaint on file. ?Patient presents today for follow up on her knees. She had total knee arthroplasties in 2021. She said that she is doing great, just feels like her knees are heavy.  ? ? ? ?Review of Systems  ?All other systems reviewed and are negative. ? ? ?Objective: ?Vital Signs: There were no vitals taken for this visit. ? ?Physical Exam ?Constitutional:   ?   Appearance: Normal appearance.  ?HENT:  ?   Head: Normocephalic.  ?Pulmonary:  ?   Effort: Pulmonary effort is normal.  ?Skin: ?   General: Skin is warm and dry.  ?Neurological:  ?   Mental Status: She is alert.  ?Psychiatric:     ?   Mood and Affect: Mood normal.     ?   Behavior: Behavior  normal.  ? ? ?Ortho Exam ?Patient is pleasant to exam she has no warmth erythema to either knee.  She has well-healed surgical incisions with just a touch of keloid scarring on the left.  She has full extension bilaterally she has flexion to 105 degrees bilaterally.  She does have some mild valgus opening with valgus stress good endpoint.  Compartments are soft and nontender.  She has good plantarflexion and dorsiflexion strength ?Specialty Comments:  ?No specialty comments available. ? ?Imaging: ?No results found. ? ? ?PMFS History: ?Patient Active Problem List  ? Diagnosis Date Noted  ? Status post total knee replacement, left 07/19/2020  ? Total knee replacement status, right 12/14/2019  ? Class 2 obesity due to excess calories without serious comorbidity with body mass index (BMI) of 39.0 to 39.9 in adult 11/05/2019  ? Unilateral primary osteoarthritis, right knee 11/05/2019  ? Unilateral primary osteoarthritis, left knee 11/05/2019  ? Bilateral primary osteoarthritis of knee 01/25/2019  ? ?Past Medical History:  ?Diagnosis Date  ? Anxiety   ? Arthritis   ? knees, hands  ? Depression   ? Hypertension   ?  ?Family History  ?Problem Relation Age of Onset  ?  Cancer Mother   ?  ?Past Surgical History:  ?Procedure Laterality Date  ? ABDOMINAL HYSTERECTOMY    ? KNEE ARTHROSCOPY    ? 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  ? Procedure: RIGHT TOTAL KNEE ARTHROPLASTY;  Surgeon: Valeria Batman, MD;  Location: [facility];  Service: Orthopedics;  Laterality: Right;  ? TOTAL KNEE ARTHROPLASTY Left 07/04/2020  ? Procedure: LEFT TOTAL KNEE ARTHROPLASTY;  Surgeon: Valeria Batman, MD;  Location: [facility];  Service: Orthopedics;  Laterality: Left;  ? ?Social History  ? ?Occupational History  ? Not on file  ?Tobacco Use  ? Smoking status: Former  ?  Packs/day: 1.00  ?  Years: 15.00  ?  Pack years: 15.00  ?  Types: Cigarettes  ?  Quit date: 12/02/2012  ?  Years since quitting: 8.8  ? Smokeless tobacco: Never  ?Vaping Use  ? Vaping  Use: Former  ?Substance and Sexual Activity  ? Alcohol use: Not Currently  ? Drug use: Never  ? Sexual activity: Yes  ? ? ? ? ? ? ?

## 2021-11-10 IMAGING — CR DG CHEST 2V
2 series · 2 of 2 positions shown · non-contrast
Comparison: None.

CLINICAL DATA: Preop evaluation for upcoming knee surgery

EXAM:
CHEST - 2 VIEW

[w chest pa]
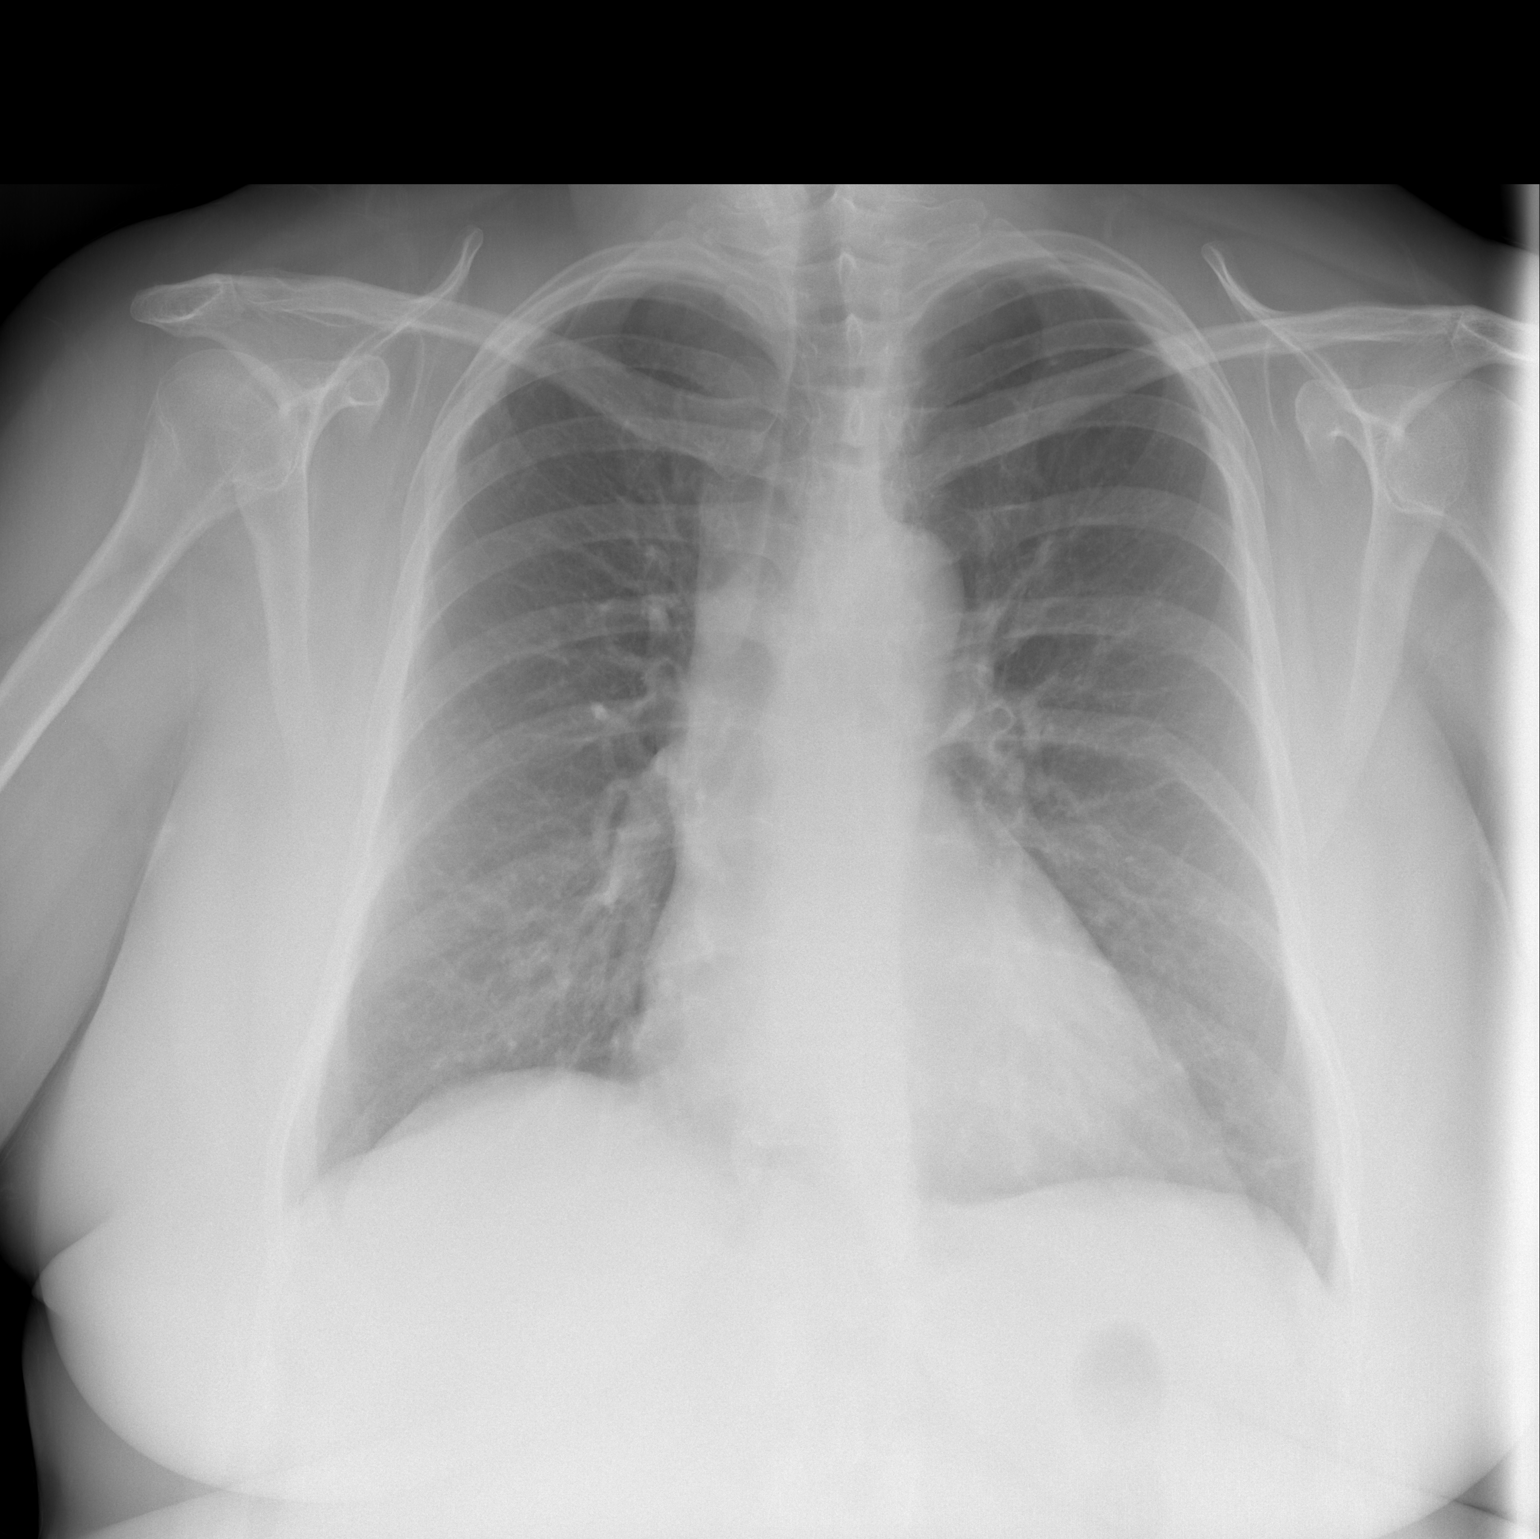

[w chest lat]
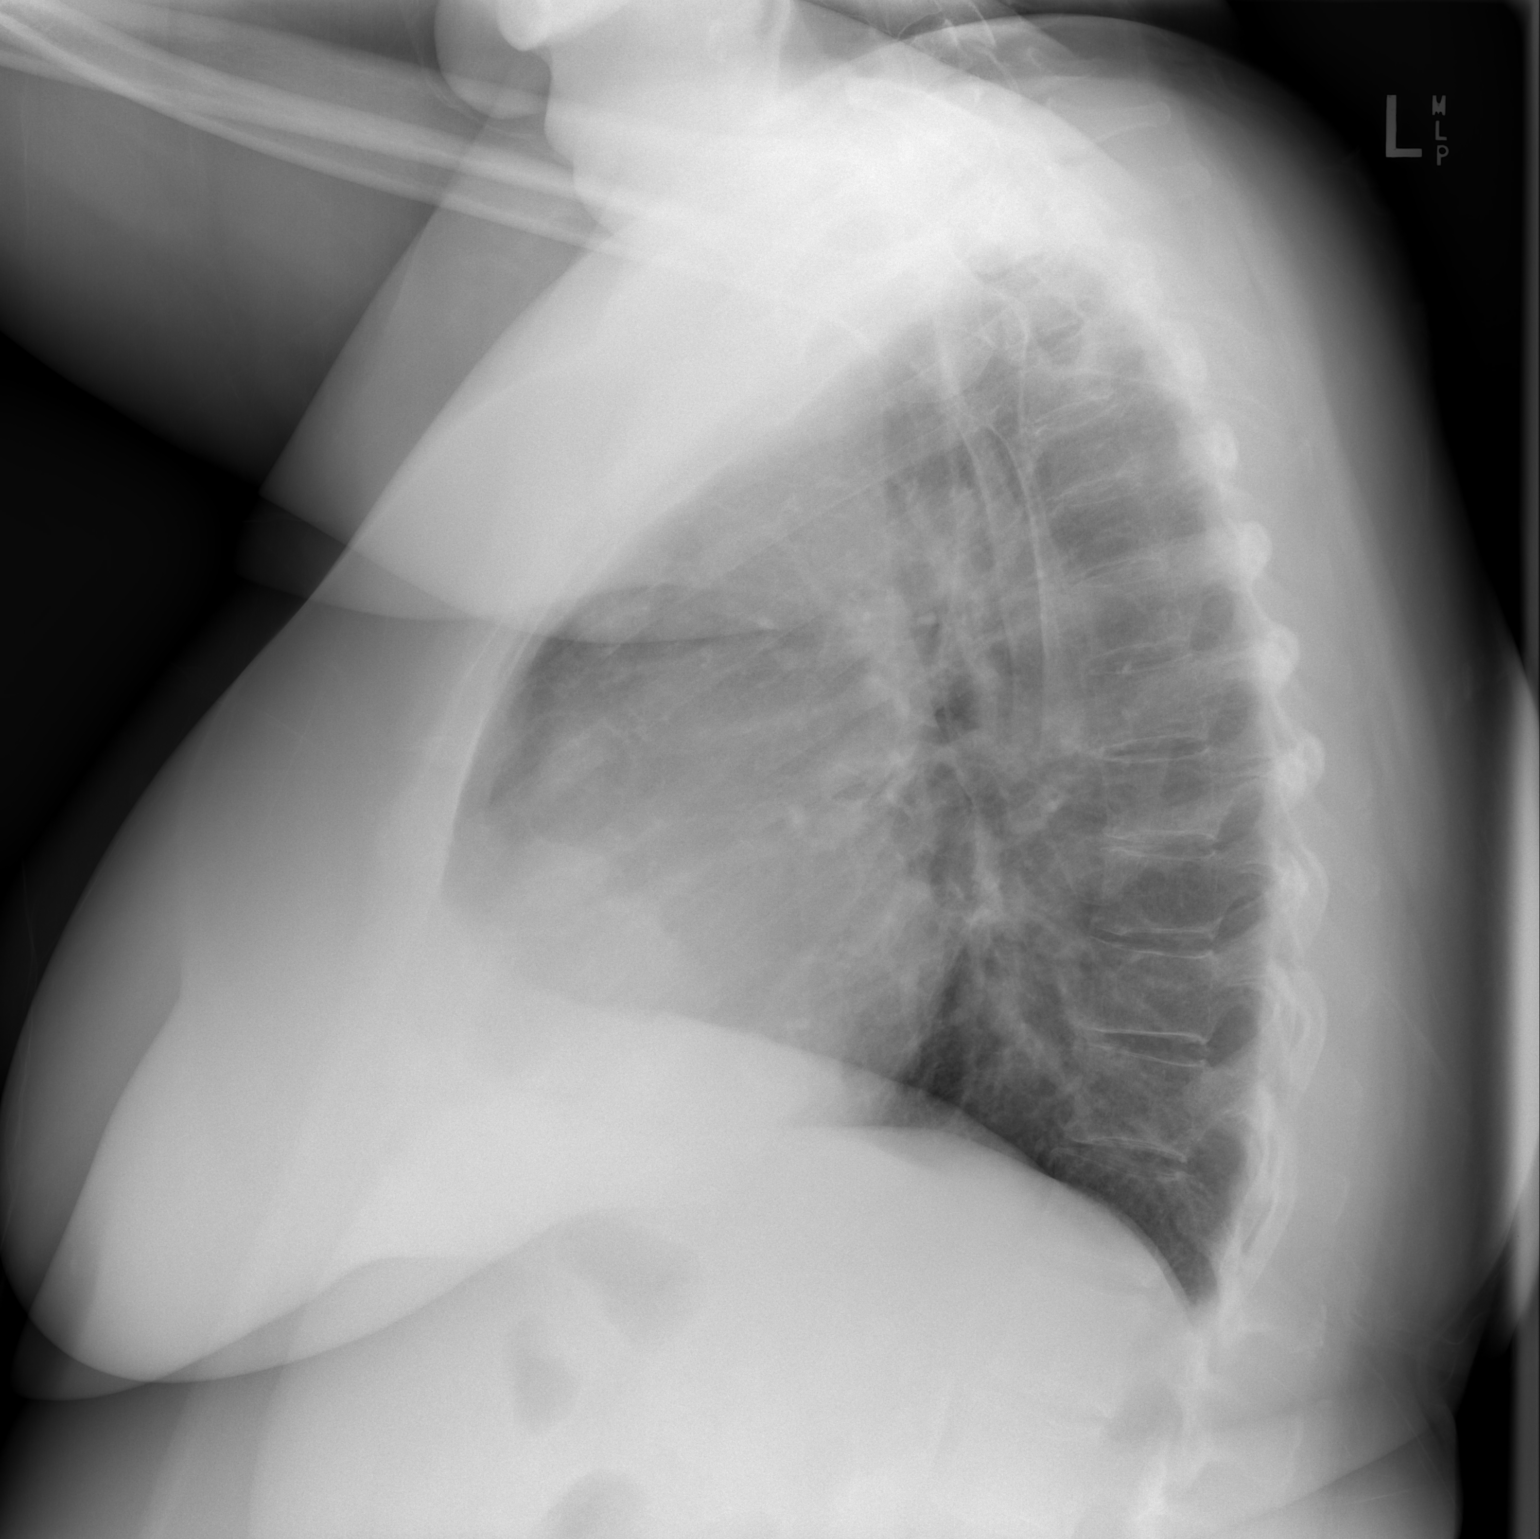

[2 of 2 positions shown; findings below may reference images not displayed]

FINDINGS: The heart size and mediastinal contours are within normal limits.
Both lungs are clear. The visualized skeletal structures are
unremarkable.
IMPRESSION: No active cardiopulmonary disease.

## 2022-02-01 ENCOUNTER — Encounter: Payer: Self-pay | Admitting: Orthopaedic Surgery

## 2022-06-25 ENCOUNTER — Encounter: Payer: Self-pay | Admitting: Orthopaedic Surgery

## 2022-06-25 ENCOUNTER — Ambulatory Visit (INDEPENDENT_AMBULATORY_CARE_PROVIDER_SITE_OTHER): Payer: Managed Care, Other (non HMO) | Admitting: Orthopaedic Surgery

## 2022-06-25 ENCOUNTER — Ambulatory Visit (INDEPENDENT_AMBULATORY_CARE_PROVIDER_SITE_OTHER): Payer: Managed Care, Other (non HMO)

## 2022-06-25 DIAGNOSIS — M65341 Trigger finger, right ring finger: Secondary | ICD-10-CM | POA: Diagnosis not present

## 2022-06-25 DIAGNOSIS — M6530 Trigger finger, unspecified finger: Secondary | ICD-10-CM | POA: Insufficient documentation

## 2022-06-25 DIAGNOSIS — M79641 Pain in right hand: Secondary | ICD-10-CM

## 2022-06-25 MED ORDER — METHYLPREDNISOLONE ACETATE 40 MG/ML IJ SUSP
20.0000 mg | INTRAMUSCULAR | Status: AC | PRN
  Administered 2022-06-25: 20 mg

## 2022-06-25 MED ORDER — LIDOCAINE HCL 1 % IJ SOLN
0.5000 mL | INTRAMUSCULAR | Status: AC | PRN
  Administered 2022-06-25: .5 mL

## 2022-06-25 NOTE — Progress Notes (Signed)
Office Visit Note   Patient: Julia [name]           Date of Birth: 08/16/1965           MRN: [id number] Visit Date: 06/25/2022              Requested by: Lavada Mesi, MD [address] [place],  [place] [address] PCP: Lavada Mesi, MD   Assessment & Plan: Visit Diagnoses:  1. Pain in right hand   2. Trigger ring finger of right hand     Plan:  Julia [name] comes in today with a almost 1 year history of pain and inability to straighten her right ring finger at the PIP joint.  She denies any injury.  She now says her finger is just stuck and she cannot passive or actively fully extend it.  X-rays did not show any degenerative changes in the joint although she does have significant arthritic changes in the [facility] joint of her thumb.  This is not symptomatic.  She does have tenderness over the A1 pulley with a palpable nodule.  With her finger flexed at the [facility] joint we are able to bring her fingers to almost full extension.  Findings are consistent with a chronic trigger finger which now has become quite stiff.  Went forward with an injection into the A1 pulley.  Following this she was almost able to bring her finger to full extension.  We recommended splinting this and gave her a splint to keep her finger in extension.  Certainly she could consider hand therapy at some point if she continues to have difficulties.  Also mentioned surgical intervention should this be a chronic issue.  Did not have any pain about the PIP joint after the injection.  Follow-Up Instructions: Return if symptoms worsen or fail to improve.   Orders:  Orders Placed This Encounter  Procedures   Hand/UE Inj: R ring A1   XR Hand Complete Right   No orders of the defined types were placed in this encounter.     Procedures: Hand/UE Inj: R ring A1 for trigger finger on 06/25/2022 3:37 PM Indications: diagnostic and therapeutic Details: 27 G needle, volar approach Medications: 0.5 mL lidocaine 1 %; 20  mg methylPREDNISolone acetate 40 MG/ML Outcome: tolerated well, no immediate complications Procedure, treatment alternatives, risks and benefits explained, specific risks discussed. Consent was given by the patient.      Clinical Data: No additional findings.   Subjective: Chief Complaint  Patient presents with   Right Hand - Pain    HPI Julia [name] comes in today with a chief complaint of pain and inability to straighten her ring finger of her right hand.  This has been persistent for about a year.  No particular injury.  She has tried wearing a splint to help keep the finger straight.  Denies active triggering of the finger.  There is no numbness or tingling  Review of Systems  All other systems reviewed and are negative.    Objective: Vital Signs: There were no vitals taken for this visit.  Physical Exam Constitutional:      Appearance: Normal appearance.  Pulmonary:     Effort: Pulmonary effort is normal.  Skin:    General: Skin is warm and dry.  Neurological:     General: No focal deficit present.     Mental Status: She is alert.     Ortho Exam Examination of her right hand she has no erythema she has  a strong radial pulse.  She has mild swelling but no erythema of the ring finger.  Brisk capillary refill.  She is focally tender over the A1 pulley.  There is a palpable nodule there.  Difficult to fully extend her ring finger at the PIP joint with a 30 degree contracture.  But with flexion at the [facility] joint can get finger passively to almost full extension.  After the injection she had significant improvement of symptoms and was able to get finger to within just a few degrees of full extension. Specialty Comments:  No specialty comments available.  Imaging: XR Hand Complete Right  Result Date: 06/25/2022 Radiographs of her right hand were reviewed today.  She does have significant arthritis of the [facility] joint of the thumb with subluxation. Well-preserved joint of the ring  finger without any deformity or degenerative changes    PMFS History: Patient Active Problem List   Diagnosis Date Noted   Trigger finger of right hand 06/25/2022   Status post total knee replacement, left 07/19/2020   Total knee replacement status, right 12/14/2019   Class 2 obesity due to excess calories without serious comorbidity with body mass index (BMI) of 39.0 to 39.9 in adult 11/05/2019   Unilateral primary osteoarthritis, right knee 11/05/2019   Unilateral primary osteoarthritis, left knee 11/05/2019   Bilateral primary osteoarthritis of knee 01/25/2019   Past Medical History:  Diagnosis Date   Anxiety    Arthritis    knees, hands   Depression    Hypertension     Family History  Problem Relation Age of Onset   Cancer Mother     Past Surgical History:  Procedure Laterality Date   ABDOMINAL HYSTERECTOMY     KNEE ARTHROSCOPY     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   Procedure: RIGHT TOTAL KNEE ARTHROPLASTY;  Surgeon: Valeria Batman, MD;  Location: [facility];  Service: Orthopedics;  Laterality: Right;   TOTAL KNEE ARTHROPLASTY Left 07/04/2020   Procedure: LEFT TOTAL KNEE ARTHROPLASTY;  Surgeon: Valeria Batman, MD;  Location: [facility];  Service: Orthopedics;  Laterality: Left;   Social History   Occupational History   Not on file  Tobacco Use   Smoking status: Former    Packs/day: 1.00    Years: 15.00    Total pack years: 15.00    Types: Cigarettes    Quit date: 12/02/2012    Years since quitting: 9.5   Smokeless tobacco: Never  Vaping Use   Vaping Use: Former  Substance and Sexual Activity   Alcohol use: Not Currently   Drug use: Never   Sexual activity: Yes

## 2022-07-02 ENCOUNTER — Ambulatory Visit: Payer: BC Managed Care – PPO | Admitting: Orthopaedic Surgery

## 2022-09-02 ENCOUNTER — Institutional Professional Consult (permissible substitution): Payer: Managed Care, Other (non HMO) | Admitting: Student

## 2022-09-08 NOTE — Progress Notes (Unsigned)
Synopsis: Referred for abnormal CT Chest by Eunice Blase, MD  Subjective:   PATIENT ID: Julia [name] GENDER: female DOB: [birth year], MRN: [id number]  No chief complaint on file.  57yF with history of DM2, HTN, anxiety, vaping who had RSV 06/2022 then admission at [facility] for RSV again, given course of prednisone  Otherwise pertinent review of systems is negative.  Past Medical History:  Diagnosis Date   Anxiety    Arthritis    knees, hands   Depression    Hypertension      Family History  Problem Relation Age of Onset   Cancer Mother      Past Surgical History:  Procedure Laterality Date   ABDOMINAL HYSTERECTOMY     KNEE ARTHROSCOPY     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   Procedure: RIGHT TOTAL KNEE ARTHROPLASTY;  Surgeon: Garald Balding, MD;  Location: [facility];  Service: Orthopedics;  Laterality: Right;   TOTAL KNEE ARTHROPLASTY Left 07/04/2020   Procedure: LEFT TOTAL KNEE ARTHROPLASTY;  Surgeon: Garald Balding, MD;  Location: [facility];  Service: Orthopedics;  Laterality: Left;    Social History   Socioeconomic History   Marital status: Married    Spouse name: Not on file   Number of children: Not on file   Years of education: Not on file   Highest education level: Not on file  Occupational History   Not on file  Tobacco Use   Smoking status: Former    Packs/day: 1.00    Years: 15.00    Total pack years: 15.00    Types: Cigarettes    Quit date: 12/02/2012    Years since quitting: 9.7   Smokeless tobacco: Never  Vaping Use   Vaping Use: Former  Substance and Sexual Activity   Alcohol use: Not Currently   Drug use: Never   Sexual activity: Yes  Other Topics Concern   Not on file  Social History Narrative   Not on file   Social Determinants of Health   Financial Resource Strain: Not on file  Food Insecurity: Not on file  Transportation Needs: Not on file  Physical Activity: Not on file  Stress: Not on file  Social  Connections: Not on file  Intimate Partner Violence: Not on file     No Known Allergies   Outpatient Medications Prior to Visit  Medication Sig Dispense Refill   clonazePAM (KLONOPIN) 1 MG tablet Take 1 mg by mouth See admin instructions. Take 1 tablet (1 mg) by mouth scheduled every evening, may take an additional dose during the day if needed for anxiety.     diclofenac Sodium (VOLTAREN) 1 % GEL Apply 2-4 g topically 4 (four) times daily as needed. 100 g 1   gabapentin (NEURONTIN) 100 MG capsule Take 1 capsule (100 mg total) by mouth 3 (three) times daily. 40 capsule 0   gabapentin (NEURONTIN) 100 MG capsule Take 1 capsule (100 mg total) by mouth 3 (three) times daily. 90 capsule 2   HYDROcodone-acetaminophen (NORCO/VICODIN) 5-325 MG tablet Take 1 tablet by mouth 3 (three) times daily as needed for moderate pain. 30 tablet 0   HYDROcodone-acetaminophen (NORCO/VICODIN) 5-325 MG tablet Take 1 tablet by mouth every 6 (six) hours as needed for moderate pain. 30 tablet 0   HYDROcodone-acetaminophen (NORCO/VICODIN) 5-325 MG tablet Take 1 tablet by mouth 3 (three) times daily as needed for moderate pain. 30 tablet 0   HYDROcodone-acetaminophen (NORCO/VICODIN) 5-325 MG tablet Take 1 tablet by mouth 3 (  three) times daily as needed for moderate pain. 30 tablet 0   lisinopril-hydrochlorothiazide (ZESTORETIC) 10-12.5 MG tablet START WITH 1/2 TABLET BY MOUTH DAILY, MAY INCREASE TO 1 TABLET BY MOUTH EVERY DAY IF NEEDED FOR BLOOD PRESSURE CONTROL 30 tablet 6   methocarbamol (ROBAXIN) 500 MG tablet Take 1 tablet by mouth TID. 30 tablet 0   oxyCODONE (OXYCONTIN) 10 mg 12 hr tablet Take 1 tablet (10 mg total) by mouth every 12 (twelve) hours. 14 tablet 0   oxyCODONE-acetaminophen (PERCOCET/ROXICET) 5-325 MG tablet Take 1 tablet by mouth every 8 (eight) hours as needed for severe pain. 30 tablet 0   oxyCODONE-acetaminophen (PERCOCET/ROXICET) 5-325 MG tablet Take 1 tablet by mouth every 8 (eight) hours as needed  for severe pain. 30 tablet 0   QUEtiapine (SEROQUEL) 25 MG tablet Take 25 mg by mouth at bedtime.     sulfamethoxazole-trimethoprim (BACTRIM DS) 800-160 MG tablet Take one tablet by mouth twice daily x 7 days. 14 tablet 0   traMADol (ULTRAM) 50 MG tablet Take 1 tablet (50 mg total) by mouth 3 (three) times daily as needed. 30 tablet 0   traMADol (ULTRAM) 50 MG tablet Take 1 tablet (50 mg total) by mouth 3 (three) times daily as needed. 30 tablet 0   traMADol (ULTRAM) 50 MG tablet Take 1 tablet (50 mg total) by mouth 3 (three) times daily as needed. 30 tablet 0   traMADol (ULTRAM) 50 MG tablet Take 1 tablet (50 mg total) by mouth 2 (two) times daily as needed. 30 tablet 0   traMADol (ULTRAM) 50 MG tablet Take 1 tablet (50 mg total) by mouth 3 (three) times daily as needed. 30 tablet 0   VIIBRYD 40 MG TABS Take 40 mg by mouth daily.     No facility-administered medications prior to visit.       Objective:   Physical Exam:  General appearance: 57 y.o., female, NAD, conversant  Eyes: anicteric sclerae; PERRL, tracking appropriately HENT: NCAT; MMM Neck: Trachea midline; no lymphadenopathy, no JVD Lungs: CTAB, no crackles, no wheeze, with normal respiratory effort CV: RRR, no murmur  Abdomen: Soft, non-tender; non-distended, BS present  Extremities: No peripheral edema, warm Skin: Normal turgor and texture; no rash Psych: Appropriate affect Neuro: Alert and oriented to person and place, no focal deficit     There were no vitals filed for this visit.   on *** LPM *** RA BMI Readings from Last 3 Encounters:  01/09/21 37.80 kg/m  10/05/20 37.80 kg/m  09/07/20 37.80 kg/m   Wt Readings from Last 3 Encounters:  01/09/21 256 lb (116.1 kg)  10/05/20 256 lb (116.1 kg)  09/07/20 256 lb (116.1 kg)     CBC    Component Value Date/Time   WBC 6.2 06/29/2020 1007   RBC 4.13 06/29/2020 1007   HGB 13.0 06/29/2020 1007   HCT 39.8 06/29/2020 1007   PLT 345 06/29/2020 1007   MCV  96.4 06/29/2020 1007   MCH 31.5 06/29/2020 1007   MCHC 32.7 06/29/2020 1007   RDW 14.0 06/29/2020 1007   LYMPHSABS 1.6 06/29/2020 1007   MONOABS 0.3 06/29/2020 1007   EOSABS 0.2 06/29/2020 1007   BASOSABS 0.1 06/29/2020 1007    ***  Chest Imaging: CT Chest 08/01/22 OSH reviewed by me with peripheral RML, RLL, lingula ggos, bronchial wall thickening  1.  Nonspecific mild right lower lobe, right middle lobe, and lingular groundglass densities, which may represent a developing pneumonitis.  2.  Negative for pleural effusion.  Pulmonary Functions Testing Results:     No data to display          FeNO: ***  Pathology: ***  Echocardiogram: ***  Heart Catheterization: ***    Assessment & Plan:    Plan:      Maryjane Hurter, MD [facility] 09/08/2022 2:35 PM

## 2022-09-09 ENCOUNTER — Telehealth: Payer: Self-pay | Admitting: Student

## 2022-09-09 ENCOUNTER — Ambulatory Visit (INDEPENDENT_AMBULATORY_CARE_PROVIDER_SITE_OTHER): Payer: Managed Care, Other (non HMO) | Admitting: Student

## 2022-09-09 ENCOUNTER — Encounter: Payer: Self-pay | Admitting: Student

## 2022-09-09 VITALS — BP 148/94 | HR 78 | Temp 98.5°F | Ht 69.0 in | Wt 272.0 lb

## 2022-09-09 DIAGNOSIS — G4719 Other hypersomnia: Secondary | ICD-10-CM | POA: Diagnosis not present

## 2022-09-09 DIAGNOSIS — R0609 Other forms of dyspnea: Secondary | ICD-10-CM

## 2022-09-09 MED ORDER — ALBUTEROL SULFATE HFA 108 (90 BASE) MCG/ACT IN AERS: 2.0000 | INHALATION_SPRAY | Freq: Four times a day (QID) | RESPIRATORY_TRACT | 6 refills | Status: AC | PRN

## 2022-09-09 MED ORDER — STIOLTO RESPIMAT 2.5-2.5 MCG/ACT IN AERS: 2.0000 | INHALATION_SPRAY | Freq: Every day | RESPIRATORY_TRACT | 0 refills | Status: DC

## 2022-09-09 NOTE — Telephone Encounter (Signed)
What is preferred laba/lama for her?  THanks!

## 2022-09-09 NOTE — Patient Instructions (Signed)
-   PFT in 3 months on same day as next clinic visit - start stiolto 2 puffs once daily - if you think it's helpful let me know and we'll fill a prescription for this or a similar one that your insurer prefers - albuterol 1-2 puffs as needed and can use 5-20 minutes before exertion - you'll be called to schedule sleep study

## 2022-09-10 ENCOUNTER — Other Ambulatory Visit (HOSPITAL_COMMUNITY): Payer: Self-pay

## 2022-09-10 NOTE — Telephone Encounter (Signed)
At this time the patients plan prefers either the Anoro Ellipta or the Stiolto Respimat.

## 2022-09-24 ENCOUNTER — Telehealth: Payer: Self-pay | Admitting: Student

## 2022-09-24 NOTE — Telephone Encounter (Signed)
Pt. Giving sample of stiolto  and would now like prescription called into pharmacy she said its helping

## 2022-09-26 MED ORDER — STIOLTO RESPIMAT 2.5-2.5 MCG/ACT IN AERS: 2.0000 | INHALATION_SPRAY | Freq: Every day | RESPIRATORY_TRACT | 11 refills | Status: AC

## 2022-09-26 NOTE — Telephone Encounter (Signed)
ATC left detailed message that Stiolto would be called in to [facility] on [address]. Nothing further needed at this time.

## 2022-12-10 NOTE — Progress Notes (Unsigned)
Synopsis: Referred for abnormal CT Chest by Lavada Mesi, MD  Subjective:   PATIENT ID: Julia [name] GENDER: female DOB: [birth year], MRN: [id number]  No chief complaint on file.  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 She had bout of bronchitis. She was given ABX for otitis and prednisone for bronchitis, given albuterol inhaler. Didn't really improve. Went to hospital 07/31/22 and was there for 4 days. Given another course of prednisone.   She is on no inhalers right now.   Some DOE that she notices when she's walking her dogs or at work. She doesn't really have much of a cough currently.   She has no family history of lung disease  Smoked for 25 years, 1ppd, quit 10 ya. Does uber some too now. Was doing nicotine/flavored vapes up most recent hospitalization. No MJ. She has 2 cats, 3 dogs.   Interval HPI Started on stiolto last visit  HST not yet done  PFTs    Otherwise pertinent review of systems is negative.   Past Medical History:  Diagnosis Date   Anxiety    Arthritis    knees, hands   Depression    Hypertension      Family History  Problem Relation Age of Onset   Cancer Mother      Past Surgical History:  Procedure Laterality Date   ABDOMINAL HYSTERECTOMY     KNEE ARTHROSCOPY     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   Procedure: RIGHT TOTAL KNEE ARTHROPLASTY;  Surgeon: Valeria Batman, MD;  Location: [facility];  Service: Orthopedics;  Laterality: Right;   TOTAL KNEE ARTHROPLASTY Left 07/04/2020   Procedure: LEFT TOTAL KNEE ARTHROPLASTY;  Surgeon: Valeria Batman, MD;  Location: [facility];  Service: Orthopedics;  Laterality: Left;    Social History   Socioeconomic History   Marital status: Married    Spouse  name: Not on file   Number of children: Not on file   Years of education: Not on file   Highest education level: Not on file  Occupational History   Not on file  Tobacco Use   Smoking status: Former    Packs/day: 1.00    Years: 25.00    Additional pack years: 0.00    Total pack years: 25.00    Types: Cigarettes    Quit date: 12/02/2012    Years since quitting: 10.0   Smokeless tobacco: Never  Vaping Use   Vaping Use: Former   Quit date: 07/15/2022   Substances: Nicotine, Flavoring  Substance and Sexual Activity   Alcohol use: Not Currently   Drug use: Never   Sexual activity: Yes  Other Topics Concern   Not on file  Social History Narrative   Not on file   Social Determinants of Health   Financial Resource Strain: Not on file  Food Insecurity: Not on file  Transportation Needs: Not on file  Physical Activity: Not on file  Stress: Not on file  Social Connections: Not on file  Intimate Partner Violence: Not on file     Allergies  Allergen Reactions   Bactrim [Sulfamethoxazole-Trimethoprim] Hives     Outpatient Medications Prior to Visit  Medication Sig Dispense Refill   albuterol (VENTOLIN HFA) 108 (90 Base) MCG/ACT inhaler  Inhale 2 puffs into the lungs every 6 (six) hours as needed for wheezing or shortness of breath. 8 g 6   brexpiprazole (REXULTI) 1 MG TABS tablet Take 1 mg by mouth daily.     clonazePAM (KLONOPIN) 1 MG tablet Take 1 mg by mouth See admin instructions. Take 1 tablet (1 mg) by mouth scheduled every evening, may take an additional dose during the day if needed for anxiety.     diclofenac Sodium (VOLTAREN) 1 % GEL Apply 2-4 g topically 4 (four) times daily as needed. 100 g 1   DULoxetine (CYMBALTA) 30 MG capsule Take 30 mg by mouth daily.     DULoxetine (CYMBALTA) 60 MG capsule Take 60 mg by mouth daily.     telmisartan (MICARDIS) 80 MG tablet Take 80 mg by mouth daily.     Tiotropium Bromide-Olodaterol (STIOLTO RESPIMAT) 2.5-2.5 MCG/ACT AERS Inhale 2  puffs into the lungs daily. 4 g 11   traZODone (DESYREL) 100 MG tablet Take 100 mg by mouth at bedtime.     No facility-administered medications prior to visit.       Objective:   Physical Exam:  General appearance: 57 y.o., female, NAD, conversant  Eyes: anicteric sclerae; PERRL, tracking appropriately HENT: NCAT; MMM Neck: Trachea midline; no lymphadenopathy, no JVD Lungs: CTAB, no crackles, no wheeze, with normal respiratory effort CV: RRR, no murmur  Abdomen: Soft, non-tender; non-distended, BS present  Extremities: No peripheral edema, warm Skin: Normal turgor and texture; no rash Psych: Appropriate affect Neuro: Alert and oriented to person and place, no focal deficit     There were no vitals filed for this visit.    on  RA BMI Readings from Last 3 Encounters:  09/09/22 40.17 kg/m  01/09/21 37.80 kg/m  10/05/20 37.80 kg/m   Wt Readings from Last 3 Encounters:  09/09/22 272 lb (123.4 kg)  01/09/21 256 lb (116.1 kg)  10/05/20 256 lb (116.1 kg)     CBC    Component Value Date/Time   WBC 6.2 06/29/2020 1007   RBC 4.13 06/29/2020 1007   HGB 13.0 06/29/2020 1007   HCT 39.8 06/29/2020 1007   PLT 345 06/29/2020 1007   MCV 96.4 06/29/2020 1007   MCH 31.5 06/29/2020 1007   MCHC 32.7 06/29/2020 1007   RDW 14.0 06/29/2020 1007   LYMPHSABS 1.6 06/29/2020 1007   MONOABS 0.3 06/29/2020 1007   EOSABS 0.2 06/29/2020 1007   BASOSABS 0.1 06/29/2020 1007      Chest Imaging: CT Chest 08/01/22 OSH reviewed by me with peripheral RML, RLL, lingula ggos, bronchial wall thickening    Pulmonary Functions Testing Results:     No data to display            Assessment & Plan:   # Emphysema  # RSV related bronchospasm, bronchiolitis Alternatively may have been early manifestation of developing COPD or ACOS  # Excessive daytime sleepiness # Loud snoring, PND  Plan: - PFT in 3 months on same day as next clinic visit - start stiolto 2 puffs once daily - if  you think it's helpful let me know and we'll fill a prescription for this or a similar one that your insurer prefers - albuterol 1-2 puffs as needed and can use 5-20 minutes before exertion - you'll be called to schedule sleep study     Omar Person, MD [facility] 12/10/2022 11:56 AM

## 2022-12-11 ENCOUNTER — Ambulatory Visit (INDEPENDENT_AMBULATORY_CARE_PROVIDER_SITE_OTHER): Payer: Managed Care, Other (non HMO) | Admitting: Student

## 2022-12-11 ENCOUNTER — Ambulatory Visit: Payer: Managed Care, Other (non HMO) | Admitting: Student

## 2022-12-11 ENCOUNTER — Encounter: Payer: Self-pay | Admitting: Student

## 2022-12-11 VITALS — BP 120/86 | HR 80 | Temp 98.0°F | Ht 69.0 in | Wt 256.0 lb

## 2022-12-11 DIAGNOSIS — R0609 Other forms of dyspnea: Secondary | ICD-10-CM | POA: Diagnosis not present

## 2022-12-11 DIAGNOSIS — J432 Centrilobular emphysema: Secondary | ICD-10-CM | POA: Diagnosis not present

## 2022-12-11 LAB — PULMONARY FUNCTION TEST
DL/VA % pred: 132 %
DL/VA: 5.44 ml/min/mmHg/L
DLCO cor % pred: 100 %
DLCO cor: 24.48 ml/min/mmHg
DLCO unc % pred: 100 %
DLCO unc: 24.48 ml/min/mmHg
FEF 25-75 Post: 4.17 L/sec
FEF 25-75 Pre: 3.31 L/sec
FEF2575-%Change-Post: 25 %
FEF2575-%Pred-Post: 143 %
FEF2575-%Pred-Pre: 114 %
FEV1-%Change-Post: 6 %
FEV1-%Pred-Post: 82 %
FEV1-%Pred-Pre: 78 %
FEV1-Post: 2.65 L
FEV1-Pre: 2.5 L
FEV1FVC-%Change-Post: 1 %
FEV1FVC-%Pred-Pre: 109 %
FEV6-%Change-Post: 5 %
FEV6-%Pred-Post: 75 %
FEV6-%Pred-Pre: 71 %
FEV6-Post: 3 L
FEV6-Pre: 2.84 L
FEV6FVC-%Pred-Post: 103 %
FEV6FVC-%Pred-Pre: 103 %
FVC-%Change-Post: 4 %
FVC-%Pred-Post: 73 %
FVC-%Pred-Pre: 70 %
FVC-Post: 3 L
FVC-Pre: 2.87 L
Post FEV1/FVC ratio: 89 %
Post FEV6/FVC ratio: 100 %
Pre FEV1/FVC ratio: 87 %
Pre FEV6/FVC Ratio: 100 %
RV % pred: 70 %
RV: 1.5 L
TLC % pred: 79 %
TLC: 4.62 L

## 2022-12-11 NOTE — Patient Instructions (Signed)
Full PFT performed today. °

## 2022-12-11 NOTE — Patient Instructions (Addendum)
-   continue stiolto 2 puffs once daily - albuterol 1-2 puffs as needed and can use 5-20 minutes before exertion - call if you wish to schedule sleep study

## 2022-12-11 NOTE — Progress Notes (Signed)
Full PFT performed today. °

## 2023-03-27 ENCOUNTER — Encounter (HOSPITAL_COMMUNITY): Payer: Self-pay | Admitting: Emergency Medicine

## 2023-03-27 ENCOUNTER — Emergency Department (HOSPITAL_COMMUNITY): Payer: Managed Care, Other (non HMO)

## 2023-03-27 ENCOUNTER — Inpatient Hospital Stay (HOSPITAL_COMMUNITY)
Admission: EM | Admit: 2023-03-27 | Discharge: 2023-03-31 | DRG: 917 | Discharge status: Other Acute Inpatient Hospital | Payer: Managed Care, Other (non HMO) | Attending: Internal Medicine | Admitting: Internal Medicine

## 2023-03-27 ENCOUNTER — Other Ambulatory Visit: Payer: Self-pay

## 2023-03-27 DIAGNOSIS — E119 Type 2 diabetes mellitus without complications: Secondary | ICD-10-CM | POA: Diagnosis present

## 2023-03-27 DIAGNOSIS — F1010 Alcohol abuse, uncomplicated: Secondary | ICD-10-CM | POA: Diagnosis present

## 2023-03-27 DIAGNOSIS — Z96653 Presence of artificial knee joint, bilateral: Secondary | ICD-10-CM | POA: Diagnosis present

## 2023-03-27 DIAGNOSIS — Z79899 Other long term (current) drug therapy: Secondary | ICD-10-CM

## 2023-03-27 DIAGNOSIS — E876 Hypokalemia: Secondary | ICD-10-CM | POA: Insufficient documentation

## 2023-03-27 DIAGNOSIS — R45851 Suicidal ideations: Secondary | ICD-10-CM

## 2023-03-27 DIAGNOSIS — F339 Major depressive disorder, recurrent, unspecified: Secondary | ICD-10-CM | POA: Insufficient documentation

## 2023-03-27 DIAGNOSIS — F4323 Adjustment disorder with mixed anxiety and depressed mood: Secondary | ICD-10-CM

## 2023-03-27 DIAGNOSIS — N179 Acute kidney failure, unspecified: Secondary | ICD-10-CM | POA: Diagnosis present

## 2023-03-27 DIAGNOSIS — Z7985 Long-term (current) use of injectable non-insulin antidiabetic drugs: Secondary | ICD-10-CM

## 2023-03-27 DIAGNOSIS — T465X2A Poisoning by other antihypertensive drugs, intentional self-harm, initial encounter: Principal | ICD-10-CM | POA: Diagnosis present

## 2023-03-27 DIAGNOSIS — Z880 Allergy status to penicillin: Secondary | ICD-10-CM

## 2023-03-27 DIAGNOSIS — F1020 Alcohol dependence, uncomplicated: Secondary | ICD-10-CM | POA: Insufficient documentation

## 2023-03-27 DIAGNOSIS — T50902A Poisoning by unspecified drugs, medicaments and biological substances, intentional self-harm, initial encounter: Principal | ICD-10-CM | POA: Diagnosis present

## 2023-03-27 DIAGNOSIS — I10 Essential (primary) hypertension: Secondary | ICD-10-CM | POA: Diagnosis present

## 2023-03-27 DIAGNOSIS — Z604 Social exclusion and rejection: Secondary | ICD-10-CM | POA: Diagnosis present

## 2023-03-27 DIAGNOSIS — Z634 Disappearance and death of family member: Secondary | ICD-10-CM

## 2023-03-27 DIAGNOSIS — Y902 Blood alcohol level of 40-59 mg/100 ml: Secondary | ICD-10-CM | POA: Diagnosis present

## 2023-03-27 DIAGNOSIS — E669 Obesity, unspecified: Secondary | ICD-10-CM | POA: Diagnosis present

## 2023-03-27 DIAGNOSIS — Z630 Problems in relationship with spouse or partner: Secondary | ICD-10-CM

## 2023-03-27 DIAGNOSIS — E8770 Fluid overload, unspecified: Secondary | ICD-10-CM | POA: Diagnosis present

## 2023-03-27 DIAGNOSIS — J9601 Acute respiratory failure with hypoxia: Secondary | ICD-10-CM | POA: Insufficient documentation

## 2023-03-27 DIAGNOSIS — Z7951 Long term (current) use of inhaled steroids: Secondary | ICD-10-CM

## 2023-03-27 DIAGNOSIS — I959 Hypotension, unspecified: Secondary | ICD-10-CM | POA: Diagnosis present

## 2023-03-27 DIAGNOSIS — Z888 Allergy status to other drugs, medicaments and biological substances status: Secondary | ICD-10-CM

## 2023-03-27 DIAGNOSIS — Z1152 Encounter for screening for COVID-19: Secondary | ICD-10-CM

## 2023-03-27 DIAGNOSIS — F411 Generalized anxiety disorder: Secondary | ICD-10-CM | POA: Insufficient documentation

## 2023-03-27 DIAGNOSIS — E785 Hyperlipidemia, unspecified: Secondary | ICD-10-CM | POA: Diagnosis present

## 2023-03-27 DIAGNOSIS — F17200 Nicotine dependence, unspecified, uncomplicated: Secondary | ICD-10-CM | POA: Insufficient documentation

## 2023-03-27 DIAGNOSIS — Z881 Allergy status to other antibiotic agents status: Secondary | ICD-10-CM

## 2023-03-27 DIAGNOSIS — F17290 Nicotine dependence, other tobacco product, uncomplicated: Secondary | ICD-10-CM | POA: Diagnosis present

## 2023-03-27 DIAGNOSIS — Z6834 Body mass index (BMI) 34.0-34.9, adult: Secondary | ICD-10-CM

## 2023-03-27 DIAGNOSIS — F909 Attention-deficit hyperactivity disorder, unspecified type: Secondary | ICD-10-CM | POA: Diagnosis present

## 2023-03-27 LAB — CBC WITH DIFFERENTIAL/PLATELET
Abs Immature Granulocytes: 0.02 10*3/uL (ref 0.00–0.07)
Basophils Absolute: 0.1 10*3/uL (ref 0.0–0.1)
Basophils Relative: 1 %
Eosinophils Absolute: 0.2 10*3/uL (ref 0.0–0.5)
Eosinophils Relative: 3 %
HCT: 41.8 % (ref 36.0–46.0)
Hemoglobin: 13.9 g/dL (ref 12.0–15.0)
Immature Granulocytes: 0 %
Lymphocytes Relative: 25 %
Lymphs Abs: 1.5 10*3/uL (ref 0.7–4.0)
MCH: 34.8 pg — ABNORMAL HIGH (ref 26.0–34.0)
MCHC: 33.3 g/dL (ref 30.0–36.0)
MCV: 104.8 fL — ABNORMAL HIGH (ref 80.0–100.0)
Monocytes Absolute: 0.3 10*3/uL (ref 0.1–1.0)
Monocytes Relative: 6 %
Neutro Abs: 3.7 10*3/uL (ref 1.7–7.7)
Neutrophils Relative %: 65 %
Platelets: 308 10*3/uL (ref 150–400)
RBC: 3.99 MIL/uL (ref 3.87–5.11)
RDW: 14.3 % (ref 11.5–15.5)
WBC: 5.7 10*3/uL (ref 4.0–10.5)
nRBC: 0 % (ref 0.0–0.2)

## 2023-03-27 LAB — COMPREHENSIVE METABOLIC PANEL
ALT: 121 U/L — ABNORMAL HIGH (ref 0–44)
AST: 94 U/L — ABNORMAL HIGH (ref 15–41)
Albumin: 4 g/dL (ref 3.5–5.0)
Alkaline Phosphatase: 63 U/L (ref 38–126)
Anion gap: 14 (ref 5–15)
BUN: 10 mg/dL (ref 6–20)
CO2: 25 mmol/L (ref 22–32)
Calcium: 9.2 mg/dL (ref 8.9–10.3)
Chloride: 102 mmol/L (ref 98–111)
Creatinine, Ser: 0.77 mg/dL (ref 0.44–1.00)
GFR, Estimated: >60 mL/min (ref >60)
Glucose, Bld: 117 mg/dL — ABNORMAL HIGH (ref 70–99)
Potassium: 3 mmol/L — ABNORMAL LOW (ref 3.5–5.1)
Sodium: 141 mmol/L (ref 135–145)
Total Bilirubin: 0.9 mg/dL (ref 0.3–1.2)
Total Protein: 7.1 g/dL (ref 6.5–8.1)

## 2023-03-27 LAB — SALICYLATE LEVEL: Salicylate Lvl: <7 mg/dL — ABNORMAL LOW (ref 7.0–30.0)

## 2023-03-27 LAB — ETHANOL: Alcohol, Ethyl (B): 54 mg/dL — ABNORMAL HIGH (ref <10)

## 2023-03-27 LAB — ACETAMINOPHEN LEVEL: Acetaminophen (Tylenol), Serum: <10 ug/mL — ABNORMAL LOW (ref 10–30)

## 2023-03-27 MED ORDER — CHARCOAL ACTIVATED PO LIQD
50.0000 g | Freq: Once | ORAL | Status: AC
  Administered 2023-03-27: 50 g via ORAL

## 2023-03-27 NOTE — ED Provider Notes (Signed)
[place] EMERGENCY DEPARTMENT AT [facility] Provider Note   CSN: [id number] Arrival date & time: 03/27/23  2143     History  Chief Complaint  Patient presents with   Drug Overdose   Suicide Attempt    Julia [name] is a 57 y.o. female.  Patient with past medical history significant for anxiety, depression, hypertension presents to the emergency department via EMS due to intentional overdose on telmisartan.  Patient has prescription for 80 mg telmisartan and endorses taking possibly 30 close pills between 2 bottles this evening.  She is unsure exactly how any pills she took but she believes a 30 pill bottle was full when she started and that the 90 pill bottle may have been helpful, although she does endorse feeling a number of these pills in the floor of her vehicle.  There are 4 total pills left between the 2 bottles at this time.  EMS reported that the patient had an SpO2 of 88% upon arrival which improved to 95+ percent on 4 L nasal cannula.  She does report some mild lightheadedness but denies other symptoms at this time.  The patient endorses suicidal ideation which has been worsening over the past month.  She states that her wife's son died from fentanyl overdose a few months ago and the wife has moved away for at least the past month.  She states that she has been having a hard time dealing with this intentionally took the pills this evening.  She does endorse taking duloxetine and Rexulti and states that she has been taking these as prescribed.   Drug Overdose       Home Medications Prior to Admission medications   Medication Sig Start Date End Date Taking? Authorizing Provider  albuterol (VENTOLIN HFA) 108 (90 Base) MCG/ACT inhaler Inhale 2 puffs into the lungs every 6 (six) hours as needed for wheezing or shortness of breath. 09/09/22   Omar Person, MD  brexpiprazole (REXULTI) 1 MG TABS tablet Take 1 mg by mouth daily.    [provider]  clonazePAM  (KLONOPIN) 1 MG tablet Take 1 mg by mouth See admin instructions. Take 1 tablet (1 mg) by mouth scheduled every evening, may take an additional dose during the day if needed for anxiety. 01/21/19   [provider]  diclofenac Sodium (VOLTAREN) 1 % GEL Apply 2-4 g topically 4 (four) times daily as needed. 07/04/20   Jetty Peeks, PA-C  DULoxetine (CYMBALTA) 30 MG capsule Take 30 mg by mouth daily.    [provider]  DULoxetine (CYMBALTA) 60 MG capsule Take 60 mg by mouth daily.    [provider]  telmisartan (MICARDIS) 80 MG tablet Take 80 mg by mouth daily.    [provider]  Tiotropium Bromide-Olodaterol (STIOLTO RESPIMAT) 2.5-2.5 MCG/ACT AERS Inhale 2 puffs into the lungs daily. 09/26/22   Omar Person, MD  traZODone (DESYREL) 100 MG tablet Take 100 mg by mouth at bedtime.    [provider]      Allergies    Bactrim [sulfamethoxazole-trimethoprim] and Penicillins    Review of Systems   Review of Systems  Physical Exam Updated Vital Signs BP 129/80   Pulse 87   Temp 98.2 F (36.8 C) (Oral)   Resp 19   Ht 5\' 8"  (1.727 m)   Wt 103.9 kg   SpO2 93%   BMI 34.82 kg/m  Physical Exam Vitals and nursing note reviewed.  Constitutional:  General: She is not in acute distress.    Appearance: She is well-developed.  HENT:     Head: Normocephalic and atraumatic.  Eyes:     Conjunctiva/sclera: Conjunctivae normal.  Cardiovascular:     Rate and Rhythm: Normal rate and regular rhythm.     Heart sounds: No murmur heard. Pulmonary:     Effort: Pulmonary effort is normal. No respiratory distress.     Breath sounds: Normal breath sounds.  Abdominal:     Palpations: Abdomen is soft.     Tenderness: There is no abdominal tenderness.  Musculoskeletal:        General: No swelling.     Cervical back: Neck supple.  Skin:    General: Skin is warm and dry.     Capillary Refill: Capillary refill takes less than 2 seconds.   Neurological:     Mental Status: She is alert.     ED Results / Procedures / Treatments   Labs (all labs ordered are listed, but only abnormal results are displayed) Labs Reviewed  COMPREHENSIVE METABOLIC PANEL - Abnormal; Notable for the following components:      Result Value   Potassium 3.0 (*)    Glucose, Bld 117 (*)    AST 94 (*)    ALT 121 (*)    All other components within normal limits  ETHANOL - Abnormal; Notable for the following components:   Alcohol, Ethyl (B) 54 (*)    All other components within normal limits  CBC WITH DIFFERENTIAL/PLATELET - Abnormal; Notable for the following components:   MCV 104.8 (*)    MCH 34.8 (*)    All other components within normal limits  ACETAMINOPHEN LEVEL - Abnormal; Notable for the following components:   Acetaminophen (Tylenol), Serum <10 (*)    All other components within normal limits  SALICYLATE LEVEL - Abnormal; Notable for the following components:   Salicylate Lvl <7.0 (*)    All other components within normal limits  RAPID URINE DRUG SCREEN, HOSP PERFORMED    EKG EKG Interpretation Date/Time:  Thursday March 27 2023 22:09:19 EDT Ventricular Rate:  87 PR Interval:  153 QRS Duration:  97 QT Interval:  405 QTC Calculation: 488 R Axis:   31  Text Interpretation: Sinus rhythm Borderline T abnormalities, anterior leads Borderline prolonged QT interval Confirmed by Alvester Chou [phone]) on 03/27/2023 10:41:05 PM  Radiology DG Chest Portable 1 View  Result Date: 03/27/2023 CLINICAL DATA:  Shortness of breath, evaluate for aspiration. EXAM: PORTABLE CHEST 1 VIEW COMPARISON:  Chest x-ray 12/03/2019. FINDINGS: The heart size and mediastinal contours are within normal limits. Both lungs are clear. The visualized skeletal structures are unremarkable. IMPRESSION: No active disease. Electronically Signed   By: Darliss Cheney M.D.   On: 03/27/2023 23:27    Procedures .Critical Care  Performed by: Darrick Grinder,  PA-C Authorized by: Darrick Grinder, PA-C   Critical care provider statement:    Critical care time (minutes):  30   Critical care was time spent personally by me on the following activities:  Development of treatment plan with patient or surrogate, discussions with consultants, evaluation of patient's response to treatment, examination of patient, ordering and review of laboratory studies, ordering and review of radiographic studies, ordering and performing treatments and interventions, pulse oximetry, re-evaluation of patient's condition and review of old charts   Care discussed with: admitting provider       Medications Ordered in ED Medications  charcoal activated (NO SORBITOL) (ACTIDOSE-AQUA) suspension 50  g (50 g Oral Given by Other 03/27/23 2300)    ED Course/ Medical Decision Making/ A&P Clinical Course as of 03/28/23 0055  Thu Mar 27, 2023  2236 This is a 57 year old female presenting to the ED with concern for intentional drug overdose.  Patient reports that she is feeling depressed, reports that her wife is also been dealing with mental health issues and this has affected the patient's wellbeing.  The patient reports he intentionally took a large amount, fistful of her blood pressure medicine, telmisartan tonight.  She called a family member something called EMS to bring her into the hospital.  She says she feels "normal".  And is denying lightheadedness, has some subjective shortness of breath.  She is on 4 L nasal cannula on arrival.  She is in no respiratory distress.  She presents with her pill bottles at the bedside, which is the telmisartan medication, with only few tablets left and one of the 2 bottles.  RN to discuss the case with poison control.  Patient is pending tox labs, chest x-ray, EKG.  She is stable at this time.  This appears to be an intentional overdose with intent for suicide; if patient attempts to leave will file IVC.  I've also discussed activated charcoal with  pharmacy given proximity of ingestion - toxicity with this medication can put her at serious risk of refractory shock [MT]  2336 Pt signed out to overnight EDP team [MT]    Clinical Course User Index [MT] Trifan, Kermit Balo, MD                                 Medical Decision Making Amount and/or Complexity of Data Reviewed Labs: ordered. Radiology: ordered.  Risk OTC drugs.   This patient presents to the ED for concern of intentional overdose on ARBs, this involves an extensive number of treatment options, and is a complaint that carries with it a high risk of complications and morbidity.     Co morbidities that complicate the patient evaluation  History of anxiety and depression   Additional history obtained:  Additional history obtained from EMS External records from outside source obtained and reviewed including family medicine notes   Lab Tests:  I Ordered, and personally interpreted labs.  The pertinent results include: Ethanol 54, potassium 3   Imaging Studies ordered:  I ordered imaging studies including chest x-ray  I independently visualized and interpreted imaging which showed no active disease I agree with the radiologist interpretation   Cardiac Monitoring: / EKG:  The patient was maintained on a cardiac monitor.  I personally viewed and interpreted the cardiac monitored which showed an underlying rhythm of: Sinus rhythm   Consultations Obtained:  I requested consultation with the medicine team, Dr.Segars, and discussed lab and imaging findings as well as pertinent plan - they recommend: admission   Problem List / ED Course / Critical interventions / Medication management   I ordered medication including activated charcoal for overdose Reevaluation of the patient after these medicines showed that the patient stayed the same I have reviewed the patients home medicines and have made adjustments as needed   Test / Admission -  Considered:  Patient with intentional overdose of ARB medication.  Unknown total amount of pills consumed.  Poison control was notified and recommended 12-hour observation with specific focus on blood pressure.  They also recommended repeat Tylenol level at midnight.  Patient will need  psychiatric evaluation while in the hospital but will not be cleared for psychiatric evaluation until through her 12-hour observation for medical clearance.  Patient would benefit at this time from admission for further evaluation and monitoring.         Final Clinical Impression(s) / ED Diagnoses Final diagnoses:  Intentional overdose, initial encounter [facility])  Suicidal ideation    Rx / DC Orders ED Discharge Orders     None         Pamala Duffel 03/28/23 9811    Terald Sleeper, MD 03/28/23 2008

## 2023-03-27 NOTE — ED Triage Notes (Signed)
Pt arrived via EMS from home. Pt reports suicidal ideation x 1 month. Tonight pt states she took "hand fulls" of telmisartan 80mg  tablets. Pt arrives with one empty bottle that contained 90 pills and a bottle with 4 pills that contained 30 when filled. Pt unsure exactly how many pills she ingested. EMS reports pts SpO2 was 88% on room air and placed pt on 4L [place]. Pt reports some lightheadedness.

## 2023-03-27 NOTE — ED Provider Notes (Incomplete)
[place] EMERGENCY DEPARTMENT AT [facility] Provider Note   CSN: [id number] Arrival date & time: 03/27/23  2143     History {Add pertinent medical, surgical, social history, OB history to HPI:1} Chief Complaint  Patient presents with  . Drug Overdose  . Suicide Attempt    Julia [name] is a 57 y.o. female.  Patient with past medical history significant for anxiety, depression, hypertension presents to the emergency department via EMS due to intentional overdose on telmisartan.  Patient has prescription for 80 mg telmisartan and endorses taking possibly 30 close pills between 2 bottles this evening.  She is unsure exactly how any pills she took but she believes a 30 pill bottle was full when she started and that the 90 pill bottle may have been helpful, although she does endorse feeling a number of these pills in the floor of her vehicle.  There are 4 total pills left between the 2 bottles at this time.  EMS reported that the patient had an SpO2 of 88% upon arrival which improved to 95+ percent on 4 L nasal cannula.  She does report some mild lightheadedness but denies other symptoms at this time.  The patient endorses suicidal ideation which has been worsening over the past month.  She states that her wife's son died from fentanyl overdose a few months ago and the wife has moved away for at least the past month.  She states that she has been having a hard time dealing with this intentionally took the pills this evening.  She does endorse taking duloxetine and Rexulti and states that she has been taking these as prescribed.   Drug Overdose       Home Medications Prior to Admission medications   Medication Sig Start Date End Date Taking? Authorizing Provider  albuterol (VENTOLIN HFA) 108 (90 Base) MCG/ACT inhaler Inhale 2 puffs into the lungs every 6 (six) hours as needed for wheezing or shortness of breath. 09/09/22   Omar Person, MD  brexpiprazole (REXULTI) 1 MG TABS  tablet Take 1 mg by mouth daily.    [provider]  clonazePAM (KLONOPIN) 1 MG tablet Take 1 mg by mouth See admin instructions. Take 1 tablet (1 mg) by mouth scheduled every evening, may take an additional dose during the day if needed for anxiety. 01/21/19   [provider]  diclofenac Sodium (VOLTAREN) 1 % GEL Apply 2-4 g topically 4 (four) times daily as needed. 07/04/20   Jetty Peeks, PA-C  DULoxetine (CYMBALTA) 30 MG capsule Take 30 mg by mouth daily.    [provider]  DULoxetine (CYMBALTA) 60 MG capsule Take 60 mg by mouth daily.    [provider]  telmisartan (MICARDIS) 80 MG tablet Take 80 mg by mouth daily.    [provider]  Tiotropium Bromide-Olodaterol (STIOLTO RESPIMAT) 2.5-2.5 MCG/ACT AERS Inhale 2 puffs into the lungs daily. 09/26/22   Omar Person, MD  traZODone (DESYREL) 100 MG tablet Take 100 mg by mouth at bedtime.    [provider]      Allergies    Bactrim [sulfamethoxazole-trimethoprim] and Penicillins    Review of Systems   Review of Systems  Physical Exam Updated Vital Signs BP 114/78   Pulse 90   Temp 98.2 F (36.8 C) (Oral)   Resp 20   Ht 5\' 8"  (1.727 m)   Wt 103.9 kg   SpO2 94%   BMI 34.82 kg/m  Physical Exam Vitals and nursing  note reviewed.  Constitutional:      General: She is not in acute distress.    Appearance: She is well-developed.  HENT:     Head: Normocephalic and atraumatic.  Eyes:     Conjunctiva/sclera: Conjunctivae normal.  Cardiovascular:     Rate and Rhythm: Normal rate and regular rhythm.     Heart sounds: No murmur heard. Pulmonary:     Effort: Pulmonary effort is normal. No respiratory distress.     Breath sounds: Normal breath sounds.  Abdominal:     Palpations: Abdomen is soft.     Tenderness: There is no abdominal tenderness.  Musculoskeletal:        General: No swelling.     Cervical back: Neck supple.  Skin:    General: Skin is warm and dry.      Capillary Refill: Capillary refill takes less than 2 seconds.  Neurological:     Mental Status: She is alert.     ED Results / Procedures / Treatments   Labs (all labs ordered are listed, but only abnormal results are displayed) Labs Reviewed  COMPREHENSIVE METABOLIC PANEL - Abnormal; Notable for the following components:      Result Value   Potassium 3.0 (*)    Glucose, Bld 117 (*)    AST 94 (*)    ALT 121 (*)    All other components within normal limits  ETHANOL - Abnormal; Notable for the following components:   Alcohol, Ethyl (B) 54 (*)    All other components within normal limits  CBC WITH DIFFERENTIAL/PLATELET - Abnormal; Notable for the following components:   MCV 104.8 (*)    MCH 34.8 (*)    All other components within normal limits  ACETAMINOPHEN LEVEL - Abnormal; Notable for the following components:   Acetaminophen (Tylenol), Serum <10 (*)    All other components within normal limits  SALICYLATE LEVEL - Abnormal; Notable for the following components:   Salicylate Lvl <7.0 (*)    All other components within normal limits  RAPID URINE DRUG SCREEN, HOSP PERFORMED    EKG EKG Interpretation Date/Time:  Thursday March 27 2023 22:09:19 EDT Ventricular Rate:  87 PR Interval:  153 QRS Duration:  97 QT Interval:  405 QTC Calculation: 488 R Axis:   31  Text Interpretation: Sinus rhythm Borderline T abnormalities, anterior leads Borderline prolonged QT interval Confirmed by Alvester Chou [phone]) on 03/27/2023 10:41:05 PM  Radiology DG Chest Portable 1 View  Result Date: 03/27/2023 CLINICAL DATA:  Shortness of breath, evaluate for aspiration. EXAM: PORTABLE CHEST 1 VIEW COMPARISON:  Chest x-ray 12/03/2019. FINDINGS: The heart size and mediastinal contours are within normal limits. Both lungs are clear. The visualized skeletal structures are unremarkable. IMPRESSION: No active disease. Electronically Signed   By: Darliss Cheney M.D.   On: 03/27/2023 23:27     Procedures .Critical Care  Performed by: Darrick Grinder, PA-C Authorized by: Darrick Grinder, PA-C   Critical care provider statement:    Critical care time (minutes):  30   Critical care was time spent personally by me on the following activities:  Development of treatment plan with patient or surrogate, discussions with consultants, evaluation of patient's response to treatment, examination of patient, ordering and review of laboratory studies, ordering and review of radiographic studies, ordering and performing treatments and interventions, pulse oximetry, re-evaluation of patient's condition and review of old charts   Care discussed with: admitting provider     {Document cardiac monitor, telemetry assessment procedure  when appropriate:1}  Medications Ordered in ED Medications  charcoal activated (NO SORBITOL) (ACTIDOSE-AQUA) suspension 50 g (50 g Oral Given by Other 03/27/23 2300)    ED Course/ Medical Decision Making/ A&P Clinical Course as of 03/28/23 0001  Thu Mar 27, 2023  2236 This is a 57 year old female presenting to the ED with concern for intentional drug overdose.  Patient reports that she is feeling depressed, reports that her wife is also been dealing with mental health issues and this has affected the patient's wellbeing.  The patient reports he intentionally took a large amount, fistful of her blood pressure medicine, telmisartan tonight.  She called a family member something called EMS to bring her into the hospital.  She says she feels "normal".  And is denying lightheadedness, has some subjective shortness of breath.  She is on 4 L nasal cannula on arrival.  She is in no respiratory distress.  She presents with her pill bottles at the bedside, which is the telmisartan medication, with only few tablets left and one of the 2 bottles.  RN to discuss the case with poison control.  Patient is pending tox labs, chest x-ray, EKG.  She is stable at this time.  This appears to  be an intentional overdose with intent for suicide; if patient attempts to leave will file IVC.  I've also discussed activated charcoal with pharmacy given proximity of ingestion - toxicity with this medication can put her at serious risk of refractory shock [MT]  2336 Pt signed out to overnight EDP team [MT]    Clinical Course User Index [MT] Trifan, Kermit Balo, MD   {   Click here for ABCD2, HEART and other calculatorsREFRESH Note before signing :1}                              Medical Decision Making Amount and/or Complexity of Data Reviewed Labs: ordered. Radiology: ordered.  Risk OTC drugs.   This patient presents to the ED for concern of intentional overdose on ARBs, this involves an extensive number of treatment options, and is a complaint that carries with it a high risk of complications and morbidity.     Co morbidities that complicate the patient evaluation  History of anxiety and depression   Additional history obtained:  Additional history obtained from EMS External records from outside source obtained and reviewed including family medicine notes   Lab Tests:  I Ordered, and personally interpreted labs.  The pertinent results include: Ethanol 54, potassium 3   Imaging Studies ordered:  I ordered imaging studies including chest x-ray  I independently visualized and interpreted imaging which showed no active disease I agree with the radiologist interpretation   Cardiac Monitoring: / EKG:  The patient was maintained on a cardiac monitor.  I personally viewed and interpreted the cardiac monitored which showed an underlying rhythm of: Sinus rhythm   Consultations Obtained:  I requested consultation with the medicine team,  and discussed lab and imaging findings as well as pertinent plan - they recommend: ***   Problem List / ED Course / Critical interventions / Medication management   I ordered medication including activated charcoal for  overdose Reevaluation of the patient after these medicines showed that the patient stayed the same I have reviewed the patients home medicines and have made adjustments as needed   Test / Admission - Considered:  Patient with intentional overdose of ARB medication.  Unknown total amount of  pills consumed.  Poison control was notified and recommended 12-hour observation with specific focus on blood pressure.  They also recommended repeat Tylenol level at midnight.  Patient will need psychiatric evaluation while in the hospital but will not be cleared for psychiatric evaluation until through her 12-hour observation for medical clearance.  Patient would benefit at this time from admission for further evaluation and monitoring.   {Document critical care time when appropriate:1} {Document review of labs and clinical decision tools ie heart score, Chads2Vasc2 etc:1}  {Document your independent review of radiology images, and any outside records:1} {Document your discussion with family members, caretakers, and with consultants:1} {Document social determinants of health affecting pt's care:1} {Document your decision making why or why not admission, treatments were needed:1} Final Clinical Impression(s) / ED Diagnoses Final diagnoses:  Intentional overdose, initial encounter (HCC)  Suicidal ideation    Rx / DC Orders ED Discharge Orders     None

## 2023-03-28 DIAGNOSIS — N179 Acute kidney failure, unspecified: Secondary | ICD-10-CM

## 2023-03-28 DIAGNOSIS — E669 Obesity, unspecified: Secondary | ICD-10-CM | POA: Diagnosis present

## 2023-03-28 DIAGNOSIS — Z96653 Presence of artificial knee joint, bilateral: Secondary | ICD-10-CM | POA: Diagnosis present

## 2023-03-28 DIAGNOSIS — Z634 Disappearance and death of family member: Secondary | ICD-10-CM | POA: Diagnosis not present

## 2023-03-28 DIAGNOSIS — F3130 Bipolar disorder, current episode depressed, mild or moderate severity, unspecified: Secondary | ICD-10-CM | POA: Diagnosis not present

## 2023-03-28 DIAGNOSIS — F909 Attention-deficit hyperactivity disorder, unspecified type: Secondary | ICD-10-CM | POA: Diagnosis present

## 2023-03-28 DIAGNOSIS — F411 Generalized anxiety disorder: Secondary | ICD-10-CM | POA: Diagnosis present

## 2023-03-28 DIAGNOSIS — E785 Hyperlipidemia, unspecified: Secondary | ICD-10-CM | POA: Diagnosis present

## 2023-03-28 DIAGNOSIS — Z7951 Long term (current) use of inhaled steroids: Secondary | ICD-10-CM | POA: Diagnosis not present

## 2023-03-28 DIAGNOSIS — E8770 Fluid overload, unspecified: Secondary | ICD-10-CM | POA: Diagnosis present

## 2023-03-28 DIAGNOSIS — Z6834 Body mass index (BMI) 34.0-34.9, adult: Secondary | ICD-10-CM | POA: Diagnosis not present

## 2023-03-28 DIAGNOSIS — Z7985 Long-term (current) use of injectable non-insulin antidiabetic drugs: Secondary | ICD-10-CM | POA: Diagnosis not present

## 2023-03-28 DIAGNOSIS — Z1152 Encounter for screening for COVID-19: Secondary | ICD-10-CM | POA: Diagnosis not present

## 2023-03-28 DIAGNOSIS — J9601 Acute respiratory failure with hypoxia: Secondary | ICD-10-CM | POA: Diagnosis present

## 2023-03-28 DIAGNOSIS — F339 Major depressive disorder, recurrent, unspecified: Secondary | ICD-10-CM | POA: Insufficient documentation

## 2023-03-28 DIAGNOSIS — I959 Hypotension, unspecified: Secondary | ICD-10-CM | POA: Diagnosis present

## 2023-03-28 DIAGNOSIS — Z888 Allergy status to other drugs, medicaments and biological substances status: Secondary | ICD-10-CM | POA: Diagnosis not present

## 2023-03-28 DIAGNOSIS — R45851 Suicidal ideations: Secondary | ICD-10-CM | POA: Diagnosis present

## 2023-03-28 DIAGNOSIS — E876 Hypokalemia: Secondary | ICD-10-CM | POA: Insufficient documentation

## 2023-03-28 DIAGNOSIS — F4323 Adjustment disorder with mixed anxiety and depressed mood: Secondary | ICD-10-CM

## 2023-03-28 DIAGNOSIS — I10 Essential (primary) hypertension: Secondary | ICD-10-CM | POA: Diagnosis present

## 2023-03-28 DIAGNOSIS — Z880 Allergy status to penicillin: Secondary | ICD-10-CM | POA: Diagnosis not present

## 2023-03-28 DIAGNOSIS — E119 Type 2 diabetes mellitus without complications: Secondary | ICD-10-CM | POA: Diagnosis present

## 2023-03-28 DIAGNOSIS — F17200 Nicotine dependence, unspecified, uncomplicated: Secondary | ICD-10-CM | POA: Insufficient documentation

## 2023-03-28 DIAGNOSIS — Z881 Allergy status to other antibiotic agents status: Secondary | ICD-10-CM | POA: Diagnosis not present

## 2023-03-28 DIAGNOSIS — F1010 Alcohol abuse, uncomplicated: Secondary | ICD-10-CM | POA: Diagnosis present

## 2023-03-28 DIAGNOSIS — T50902A Poisoning by unspecified drugs, medicaments and biological substances, intentional self-harm, initial encounter: Secondary | ICD-10-CM | POA: Diagnosis present

## 2023-03-28 DIAGNOSIS — F1020 Alcohol dependence, uncomplicated: Secondary | ICD-10-CM | POA: Insufficient documentation

## 2023-03-28 DIAGNOSIS — Z79899 Other long term (current) drug therapy: Secondary | ICD-10-CM | POA: Diagnosis not present

## 2023-03-28 DIAGNOSIS — T465X2A Poisoning by other antihypertensive drugs, intentional self-harm, initial encounter: Secondary | ICD-10-CM | POA: Diagnosis present

## 2023-03-28 DIAGNOSIS — Y902 Blood alcohol level of 40-59 mg/100 ml: Secondary | ICD-10-CM | POA: Diagnosis present

## 2023-03-28 LAB — HIV ANTIBODY (ROUTINE TESTING W REFLEX): HIV Screen 4th Generation wRfx: NONREACTIVE

## 2023-03-28 LAB — COMPREHENSIVE METABOLIC PANEL
ALT: 94 U/L — ABNORMAL HIGH (ref 0–44)
AST: 67 U/L — ABNORMAL HIGH (ref 15–41)
Albumin: 3.7 g/dL (ref 3.5–5.0)
Alkaline Phosphatase: 57 U/L (ref 38–126)
Anion gap: 9 (ref 5–15)
BUN: 13 mg/dL (ref 6–20)
CO2: 24 mmol/L (ref 22–32)
Calcium: 8.7 mg/dL — ABNORMAL LOW (ref 8.9–10.3)
Chloride: 104 mmol/L (ref 98–111)
Creatinine, Ser: 2.48 mg/dL — ABNORMAL HIGH (ref 0.44–1.00)
GFR, Estimated: 22 mL/min — ABNORMAL LOW (ref >60)
Glucose, Bld: 101 mg/dL — ABNORMAL HIGH (ref 70–99)
Potassium: 4.6 mmol/L (ref 3.5–5.1)
Sodium: 137 mmol/L (ref 135–145)
Total Bilirubin: 0.9 mg/dL (ref 0.3–1.2)
Total Protein: 6.4 g/dL — ABNORMAL LOW (ref 6.5–8.1)

## 2023-03-28 LAB — BASIC METABOLIC PANEL
Anion gap: 9 (ref 5–15)
BUN: 9 mg/dL (ref 6–20)
CO2: 28 mmol/L (ref 22–32)
Calcium: 8.7 mg/dL — ABNORMAL LOW (ref 8.9–10.3)
Chloride: 103 mmol/L (ref 98–111)
Creatinine, Ser: 1.53 mg/dL — ABNORMAL HIGH (ref 0.44–1.00)
GFR, Estimated: 40 mL/min — ABNORMAL LOW (ref >60)
Glucose, Bld: 112 mg/dL — ABNORMAL HIGH (ref 70–99)
Potassium: 4.3 mmol/L (ref 3.5–5.1)
Sodium: 140 mmol/L (ref 135–145)

## 2023-03-28 LAB — RAPID URINE DRUG SCREEN, HOSP PERFORMED
Amphetamines: NOT DETECTED
Barbiturates: NOT DETECTED
Benzodiazepines: NOT DETECTED
Cocaine: NOT DETECTED
Opiates: NOT DETECTED
Tetrahydrocannabinol: NOT DETECTED

## 2023-03-28 LAB — MAGNESIUM
Magnesium: 1.9 mg/dL (ref 1.7–2.4)
Magnesium: 2 mg/dL (ref 1.7–2.4)

## 2023-03-28 LAB — TSH: TSH: 0.922 u[IU]/mL (ref 0.350–4.500)

## 2023-03-28 MED ORDER — TRAZODONE HCL 100 MG PO TABS: 100.0000 mg | ORAL_TABLET | Freq: Every evening | ORAL | Status: DC | PRN

## 2023-03-28 MED ORDER — SODIUM ZIRCONIUM CYCLOSILICATE 5 G PO PACK
5.0000 g | PACK | Freq: Once | ORAL | Status: AC
  Administered 2023-03-28: 5 g via ORAL
  Filled 2023-03-28: qty 1

## 2023-03-28 MED ORDER — POTASSIUM CHLORIDE CRYS ER 20 MEQ PO TBCR
40.0000 meq | EXTENDED_RELEASE_TABLET | Freq: Once | ORAL | Status: AC
  Administered 2023-03-28: 40 meq via ORAL
  Filled 2023-03-28: qty 2

## 2023-03-28 MED ORDER — THIAMINE MONONITRATE 100 MG PO TABS
100.0000 mg | ORAL_TABLET | Freq: Every day | ORAL | Status: DC
  Administered 2023-03-28 – 2023-03-31 (×4): 100 mg via ORAL
  Filled 2023-03-28 (×4): qty 1

## 2023-03-28 MED ORDER — LACTATED RINGERS IV BOLUS
1000.0000 mL | Freq: Once | INTRAVENOUS | Status: AC
  Administered 2023-03-28: 1000 mL via INTRAVENOUS

## 2023-03-28 MED ORDER — DULOXETINE HCL 60 MG PO CPEP
60.0000 mg | ORAL_CAPSULE | Freq: Every day | ORAL | Status: DC
  Administered 2023-03-29: 60 mg via ORAL
  Filled 2023-03-28: qty 1

## 2023-03-28 MED ORDER — LORAZEPAM 1 MG PO TABS
1.0000 mg | ORAL_TABLET | ORAL | Status: DC | PRN
  Administered 2023-03-29 – 2023-03-30 (×2): 1 mg via ORAL
  Filled 2023-03-28 (×2): qty 1

## 2023-03-28 MED ORDER — BREXPIPRAZOLE 1 MG PO TABS
1.0000 mg | ORAL_TABLET | Freq: Every day | ORAL | Status: DC
  Administered 2023-03-28 – 2023-03-31 (×4): 1 mg via ORAL
  Filled 2023-03-28 (×4): qty 1

## 2023-03-28 MED ORDER — DULOXETINE HCL 30 MG PO CPEP: 60.0000 mg | ORAL_CAPSULE | Freq: Every day | ORAL | Status: DC

## 2023-03-28 MED ORDER — ADULT MULTIVITAMIN W/MINERALS CH
1.0000 | ORAL_TABLET | Freq: Every day | ORAL | Status: DC
  Administered 2023-03-28: 1 via ORAL
  Filled 2023-03-28 (×4): qty 1

## 2023-03-28 MED ORDER — TRAZODONE HCL 50 MG PO TABS: 100.0000 mg | ORAL_TABLET | Freq: Every day | ORAL | Status: DC

## 2023-03-28 MED ORDER — SODIUM CHLORIDE 0.9 % IV SOLN: INTRAVENOUS | Status: DC

## 2023-03-28 MED ORDER — LORAZEPAM 2 MG/ML IJ SOLN: 1.0000 mg | INTRAMUSCULAR | Status: DC | PRN

## 2023-03-28 MED ORDER — DULOXETINE HCL 30 MG PO CPEP: 30.0000 mg | ORAL_CAPSULE | Freq: Every day | ORAL | Status: DC

## 2023-03-28 MED ORDER — SODIUM CHLORIDE 0.9% FLUSH
3.0000 mL | Freq: Two times a day (BID) | INTRAVENOUS | Status: DC
  Administered 2023-03-28 – 2023-03-31 (×5): 3 mL via INTRAVENOUS

## 2023-03-28 MED ORDER — DULOXETINE HCL 60 MG PO CPEP
120.0000 mg | ORAL_CAPSULE | Freq: Every day | ORAL | Status: DC
  Administered 2023-03-28: 120 mg via ORAL
  Filled 2023-03-28: qty 2

## 2023-03-28 NOTE — ED Notes (Signed)
ED TO INPATIENT HANDOFF REPORT  ED Nurse Name and Phone #: Beatris Ship RN [phone] & Marcelino Duster RN [phone]  S Name/Age/Gender Julia [name] 57 y.o. female Room/Bed: 037C/037C  Code Status   Code Status: Full Code  Home/SNF/Other Home Patient oriented to: self, place, time, and situation Is this baseline? Yes   Triage Complete: Triage complete  Chief Complaint Intentional overdose [facility]) [T50.902A]  Triage Note Pt arrived via EMS from home. Pt reports suicidal ideation x 1 month. Tonight pt states she took "hand fulls" of telmisartan 80mg  tablets. Pt arrives with one empty bottle that contained 90 pills and a bottle with 4 pills that contained 30 when filled. Pt unsure exactly how many pills she ingested. EMS reports pts SpO2 was 88% on room air and placed pt on 4L [place]. Pt reports some lightheadedness.    Allergies Allergies  Allergen Reactions   Bactrim [Sulfamethoxazole-Trimethoprim] Hives   Penicillins Itching    Level of Care/Admitting Diagnosis ED Disposition     ED Disposition  Admit   Condition  --   Comment  Hospital Area: [facility] [[id number]]  Level of Care: Telemetry Medical [104]  May place patient in observation at [facility] or [facility] if equivalent level of care is available:: Yes  Covid Evaluation: Asymptomatic - no recent exposure (last 10 days) testing not required  Diagnosis: Intentional overdose [facility]) [[id number]]  Admitting Physician: Dolly Rias [[id number]]  Attending Physician: Dolly Rias [[id number]]          B Medical/Surgery History Past Medical History:  Diagnosis Date   Anxiety    Arthritis    knees, hands   Depression    Hypertension    Past Surgical History:  Procedure Laterality Date   ABDOMINAL HYSTERECTOMY     KNEE ARTHROSCOPY     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   Procedure: RIGHT TOTAL KNEE ARTHROPLASTY;  Surgeon: Valeria Batman, MD;  Location: [facility] ORS;  Service: Orthopedics;  Laterality: Right;    TOTAL KNEE ARTHROPLASTY Left 07/04/2020   Procedure: LEFT TOTAL KNEE ARTHROPLASTY;  Surgeon: Valeria Batman, MD;  Location: [facility] ORS;  Service: Orthopedics;  Laterality: Left;     A IV Location/Drains/Wounds Patient Lines/Drains/Airways Status     Active Line/Drains/Airways     Name Placement date Placement time Site Days   Peripheral IV 03/27/23 18 G Right Antecubital 03/27/23  2149  Antecubital  1   Incision (Closed) 12/14/19 Knee Right 12/14/19  0833  -- 1200   Incision (Closed) 07/04/20 Knee Left 07/04/20  0933  -- 997            Intake/Output Last 24 hours  Intake/Output Summary (Last 24 hours) at 03/28/2023 0732 Last data filed at 03/28/2023 0616 Gross per 24 hour  Intake 1000 ml  Output --  Net 1000 ml    Labs/Imaging Results for orders placed or performed during the hospital encounter of 03/27/23 (from the past 48 hour(s))  Comprehensive metabolic panel     Status: Abnormal   Collection Time: 03/27/23 10:25 PM  Result Value Ref Range   Sodium 141 135 - 145 mmol/L   Potassium 3.0 (L) 3.5 - 5.1 mmol/L   Chloride 102 98 - 111 mmol/L   CO2 25 22 - 32 mmol/L   Glucose, Bld 117 (H) 70 - 99 mg/dL    Comment: Glucose reference range applies only to samples taken after fasting for at least 8 hours.   BUN 10 6 - 20 mg/dL  Creatinine, Ser 0.77 0.44 - 1.00 mg/dL   Calcium 9.2 8.9 - 40.9 mg/dL   Total Protein 7.1 6.5 - 8.1 g/dL   Albumin 4.0 3.5 - 5.0 g/dL   AST 94 (H) 15 - 41 U/L   ALT 121 (H) 0 - 44 U/L   Alkaline Phosphatase 63 38 - 126 U/L   Total Bilirubin 0.9 0.3 - 1.2 mg/dL   GFR, Estimated >81 >19 mL/min    Comment: (NOTE) Calculated using the CKD-EPI Creatinine Equation (2021)    Anion gap 14 5 - 15    Comment: Performed at [facility], [address] [place], [place] [address]  Ethanol     Status: Abnormal   Collection Time: 03/27/23 10:25 PM  Result Value Ref Range   Alcohol, Ethyl (B) 54 (H) <10 mg/dL    Comment: (NOTE) Lowest detectable  limit for serum alcohol is 10 mg/dL.  For medical purposes only. Performed at [facility], [address] [place], [place] [address]   CBC with Diff     Status: Abnormal   Collection Time: 03/27/23 10:25 PM  Result Value Ref Range   WBC 5.7 4.0 - 10.5 K/uL   RBC 3.99 3.87 - 5.11 MIL/uL   Hemoglobin 13.9 12.0 - 15.0 g/dL   HCT 30.8 65.7 - 84.6 %   MCV 104.8 (H) 80.0 - 100.0 fL   MCH 34.8 (H) 26.0 - 34.0 pg   MCHC 33.3 30.0 - 36.0 g/dL   RDW 96.2 95.2 - 84.1 %   Platelets 308 150 - 400 K/uL   nRBC 0.0 0.0 - 0.2 %   Neutrophils Relative % 65 %   Neutro Abs 3.7 1.7 - 7.7 K/uL   Lymphocytes Relative 25 %   Lymphs Abs 1.5 0.7 - 4.0 K/uL   Monocytes Relative 6 %   Monocytes Absolute 0.3 0.1 - 1.0 K/uL   Eosinophils Relative 3 %   Eosinophils Absolute 0.2 0.0 - 0.5 K/uL   Basophils Relative 1 %   Basophils Absolute 0.1 0.0 - 0.1 K/uL   Immature Granulocytes 0 %   Abs Immature Granulocytes 0.02 0.00 - 0.07 K/uL    Comment: Performed at [facility], [address], [place], [place] [address]  Acetaminophen level     Status: Abnormal   Collection Time: 03/27/23 10:25 PM  Result Value Ref Range   Acetaminophen (Tylenol), Serum <10 (L) 10 - 30 ug/mL    Comment: (NOTE) Therapeutic concentrations vary significantly. A range of 10-30 ug/mL  may be an effective concentration for many patients. However, some  are best treated at concentrations outside of this range. Acetaminophen concentrations >150 ug/mL at 4 hours after ingestion  and >50 ug/mL at 12 hours after ingestion are often associated with  toxic reactions.  Performed at [facility], [address] [place], [place] [address]   Salicylate level     Status: Abnormal   Collection Time: 03/27/23 10:25 PM  Result Value Ref Range   Salicylate Lvl <7.0 (L) 7.0 - 30.0 mg/dL    Comment: Performed at [facility], [address] [place], [place] [address]  Urine rapid drug screen (hosp performed)      Status: None   Collection Time: 03/27/23 11:49 PM  Result Value Ref Range   Opiates NONE DETECTED NONE DETECTED   Cocaine NONE DETECTED NONE DETECTED   Benzodiazepines NONE DETECTED NONE DETECTED   Amphetamines NONE DETECTED NONE DETECTED  Tetrahydrocannabinol NONE DETECTED NONE DETECTED   Barbiturates NONE DETECTED NONE DETECTED    Comment: (NOTE) DRUG SCREEN FOR MEDICAL PURPOSES ONLY.  IF CONFIRMATION IS NEEDED FOR ANY PURPOSE, NOTIFY LAB WITHIN 5 DAYS.  LOWEST DETECTABLE LIMITS FOR URINE DRUG SCREEN Drug Class                     Cutoff (ng/mL) Amphetamine and metabolites    1000 Barbiturate and metabolites    200 Benzodiazepine                 200 Opiates and metabolites        300 Cocaine and metabolites        300 THC                            50 Performed at [facility], [address] [place], [place] [address]   Basic metabolic panel     Status: Abnormal   Collection Time: 03/28/23  6:14 AM  Result Value Ref Range   Sodium 140 135 - 145 mmol/L   Potassium 4.3 3.5 - 5.1 mmol/L   Chloride 103 98 - 111 mmol/L   CO2 28 22 - 32 mmol/L   Glucose, Bld 112 (H) 70 - 99 mg/dL    Comment: Glucose reference range applies only to samples taken after fasting for at least 8 hours.   BUN 9 6 - 20 mg/dL   Creatinine, Ser 1.30 (H) 0.44 - 1.00 mg/dL   Calcium 8.7 (L) 8.9 - 10.3 mg/dL   GFR, Estimated 40 (L) >60 mL/min    Comment: (NOTE) Calculated using the CKD-EPI Creatinine Equation (2021)    Anion gap 9 5 - 15    Comment: Performed at [facility], [address] [place], [place] [address]  Magnesium     Status: None   Collection Time: 03/28/23  6:14 AM  Result Value Ref Range   Magnesium 1.9 1.7 - 2.4 mg/dL    Comment: Performed at [facility], [address], [place], [place] [address]   DG Chest Portable 1 View  Result Date: 03/27/2023 CLINICAL DATA:  Shortness of breath, evaluate for aspiration. EXAM: PORTABLE CHEST 1 VIEW COMPARISON:  Chest  x-ray 12/03/2019. FINDINGS: The heart size and mediastinal contours are within normal limits. Both lungs are clear. The visualized skeletal structures are unremarkable. IMPRESSION: No active disease. Electronically Signed   By: Darliss Cheney M.D.   On: 03/27/2023 23:27    Pending Labs Unresulted Labs (From admission, onward)     Start     Ordered   03/28/23 0500  HIV Antibody (routine testing w rflx)  (HIV Antibody (Routine testing w reflex) panel)  Tomorrow morning,   R        03/28/23 0232            Vitals/Pain Today's Vitals   03/28/23 0500 03/28/23 0515 03/28/23 0618 03/28/23 0700  BP: 93/81 (!) 109/95 (!) 121/51 122/78  Pulse: 77 96 79 74  Resp: 20 19 16    Temp:   (!) 97.5 F (36.4 C)   TempSrc:   Oral   SpO2: 92% 93% 93% 92%  Weight:      Height:      PainSc:        Isolation Precautions No active isolations  Medications Medications  sodium chloride flush (NS) 0.9 % injection 3 mL (3 mLs Intravenous  Given 03/28/23 0243)  brexpiprazole (REXULTI) tablet 1 mg (has no administration in time range)  DULoxetine (CYMBALTA) DR capsule 30 mg (has no administration in time range)  DULoxetine (CYMBALTA) DR capsule 60 mg (has no administration in time range)  charcoal activated (NO SORBITOL) (ACTIDOSE-AQUA) suspension 50 g (50 g Oral Given by Other 03/27/23 2300)  potassium chloride SA (KLOR-CON M) CR tablet 40 mEq (40 mEq Oral Given 03/28/23 0242)  lactated ringers bolus 1,000 mL (0 mLs Intravenous Stopped 03/28/23 0616)    Mobility walks      R Recommendations: See Admitting Provider Note  Report given to: SBAR to IP bed

## 2023-03-28 NOTE — Consult Note (Signed)
[name] New Face-to-Face Psychiatric Consult Evaluation   [name] Consult Evaluation  Service Date: March 28, 2023 LOS:  LOS: 0 days    Primary Psychiatric Diagnoses  Rule out history of major depressive disorder/current depressive episode vs adjustment disorder with mixed anxiety and depressed mood  2.  Generalized anxiety disorder 3.  History of polysubstance abuse  Assessment  Julia [name] is a 57 y.o. female admitted medically for 03/27/2023  9:43 PM for medical and psychiatric management following intentional overdose. She carries the psychiatric diagnoses of anxiety, depression, and ADHD and has a past medical history of  HTN, diabetes, and arthritis. Psychiatry was consulted for "intentional overdose, major depressive episode" by Dr. Lazarus Salines.  Current outpatient psychotropic medications include duloxetine 60 mg daily, rexulti 1 mg daily, adderall 20 mg daily, and trazodone 100 mg nightly and historically she has had a therapeutic response to these medications as described below. She was compliant with medications prior to admission as evidenced by patient report.   9/13: On initial examination, patient was cooperative and interacted with interviewer as appropriate. Mood was depressed with congruent affect and tearfulness; affect not entirely flat but range appeared to be somewhat restricted by mood. Patient demonstrated fair judgement and insight throughout most of the encounter. There was no evidence of psychosis or major neurological deficits.    Per chart review, patient presents with history of unspecified depressive disorder. Given patient's home regimen including Cymbalta 60 mg daily and Rexulti 1 mg daily as prescribed by patient's medication management provider at current, it is reasonable to have high suspicion that patient previously met criteria for major depressive disorder. However, given limited access to documented psychiatric history  and incomplete patient report, there is not enough evidence to unquestionably state that patient has previously as a matter of fact met criteria for MDD. Based on current presentation and subjective report, patient does not meet full blown criteria for MDD at this time; however there is also some suspicion for minimization of symptoms. If previous MDD diagnosis is true and patient's report is accurate, patient still does not meet criteria for a depressive episode at this time but rather would be in partial remission. Again, given limited information and concern for possible minimization, strongly recommend further exploration and suggest contacting patient's psychiatric care team at [facility] as there is no access to their documentation at this time.   Chart review also demonstrates previously diagnosed unspecified anxiety disorder. Again, cannot speak to whether patient previously met criteria for a more specified anxiety diagnosis. Regardless, at this time does meet criteria for generalized anxiety disorder based on initial encounter: patient endorses excessive worrying about many things on most days for greater than 6 months with difficult controlling worrying along with restlessness/on edge feeling, irritability, and difficulty concentrating; patient has experienced significant distress as a result of anxiety related symptoms.   In regards to reason for admission and psychiatric consult, patient reports that overdose was a cry for help with no intention or want to actually kill herself; patient further reports that she wanted other people to understand what she is experiencing, and this was part of her attempt to achieve that. In short, patient attributes her decision to distress that has resulted from marital conflict and death of child as documented below. Patient does endorse remorse and regret for her decision, she states that she is happy to be alive, and she does demonstrate  insight that while her intent was not  suicidal, the decision was an unhealthy maladaptive coping mechanism and does necessitate inpatient psychiatric admission as all forms of suicide attempt are treated with grave seriousness. Patient remains agreeable to and understanding of the need for inpatient psychiatric admission at this time; IVC will be pursued if patient were to no longer consent. At this time, consider patient's overall risk for suicide to be moderate based on the following: previous suicide attempt is the greatest risk factor for subsequent attempts, which is highest within the first week, which classifies patients as high risk; on the other hand, patient's lack of chronic depression and anxiety at baseline (again based on today's interview which may not be fully accurate) as well as her substantial number of protective factors, which she identifies as reasons to live, would make her low risk. Based on this reasoning, again at this time patient is considered moderate risk. Psychiatry consult and inpatient teams will continue to assess for suicidality and plan for safety.   Prior to medical clearance and under care of psychiatric consult team, home medications were restarted as appropriate as follows: Cymbalta 60 mg daily for depression and anxiety, Rixulti for further mood augmentation, and Trazodone 100 mg for sleep. Adderall held at this time. Likewise, recommend continue holding of drug used for overdose (telmisartan). Defer further med management to inpatient psychiatric team at this time unless otherwise indicated.    Please see plan below for detailed recommendations.    Suicide risk assessment:  Patient has following modifiable risk factors for suicide: social isolation (lives alone x 1 month but does interact with family, friends, and coworkers outside of residence of living), which we are addressing with planned inpatient psychiatric admission for further assessment, medication  management, safety planning, and psychosocial support +/- substance abuse resources.   Patient has following non-modifiable or demographic risk factors for suicide: history of suicide attempt and psychiatric hospitalization  Patient has the following protective factors against suicide: Access to outpatient mental health care, Supportive family, Supportive friends, and Pets in the home   Diagnoses:  Active Hospital problems: Principal Problem:   Intentional overdose [facility]) Active Problems:   Major depressive episode   Acute hypoxic respiratory failure (HCC)   Hypokalemia     Relevant Aspects of Hospital Course:  Presented via EMS from home and admitted on 03/27/2023 after overdose on ARB as cry for help without actual intent of suicide. On initial presentation to ED, SpO2 was 88% on RA at which time she was placed on 4L [place], indicating acute hypoxic respiratory failure. CXR reassuring with no evidence of acute pathology. Oxygenation improved, and patient resumed on RA with saturation remaining WNL. Internal medicine consulted early in admission and documented suspicion for hypoventilation, OHS, and OSA in correlation to previous hypoxia; in agreement for further assessment, especially given these conditions can contribute to psychiatric presentation. Abnormal labs results of significance include ethanol 54 and potassium 3; potassium repleted. Notably, UDS, salicylates, and tylenol negative and there was no objective evidence at that time of additional co-ingestion, consistent with patient's subjective report. Activated charcoal was administered for overdose.  Poison control consulted and recommended repeat potassium level with magnesium, which were WNL. Patient maintained on cardiac monitor which demonstrated sinus rhythm, consistent with EKG though there were borderline T wave abnormalities in the anterior leads with borderline prolonged QT interval; QTc WNL. While BP initially stable, patient became  hypotensive throughout initial workup and received 1L LR with BP stabilization. Internal medicine and documented that hypotension is result of the  medication patient overdosed on, which is an antihypertensive; telmisartan held given it was the drug of overdose. BP monitoring was continued and psychiatry consulted. Inpatient psychiatric admission pending medical clearance; plan for clearance if BP remains stable without further clinically significant hypotensive episodes. Patient had sitter throughout admission while awaiting medical clearance. Acute kidney injury emerged during this time with increased creatine/decreased GFR on repeat CMP; nephrology consulted and recommended conservative management with IV fluids and close monitoring. Nephrology also noted that increase in potassium, though WNL, was more than expected; noted high risk of developing hyperkalemia given sudden rise in creatinine. Electrolytes and renal function continued to be followed on routinely repeated CMP. Similar elevation in LFTs on subsequent labs; again suspect relation to overdose and will continue to follow.    Plan & Recommendations   1. Psychiatric Medication Recommendations:  Restart home psychotropics as appropriate:  Start Cymbalta 60 mg daily for anxiety and depression  Start Rexulti 1 mg daily for mood augmentation  Start Trazodone 100 mg nightly for sleep  Hold Adderall 20 mg IR BID at this time  Initiate precautions for alcohol withdrawal given significant history of alcohol abuse in the past with relapse on day of overdose CIWA protocol in place with Ativan  Start thiamine 100 mg daily for cerebellar/cerebral protection  Start multivitamin tablet daily to ensure nutritional requirements met   2. Medical Decision Making Capacity:  Patient determined to have capacity to make decisions regarding psychiatric medication management at this time   3. Further Work-up:  Atypical antipsychotic monitoring per guidelines   TSH: ordered  Lipid profile: ordered  HbA1c: ordered  EKG 03/27/23: QtC 488 Recommend TOC consult for substance abuse resources - reasonable to defer to inpatient psychiatric team    Pertinent labwork reviewed earlier this admission includes:  Potassium: 3.0 (low) -> 4.3 (WNL) Creatinine: 0.77 (WNL) -> 1.53 (elevated)  GFR: > 60 (WNL) -> 40 (low) UDS, tylenol level, and salicylate level: WNL BAL: 54 (elevated)   4. Behavioral / Environmental:  Recommend 24 hour sitting in setting of recent suicide attempt    5. Legal Status Patient voluntary admission at this time, will IVC if necessary as noted below    Disposition:  We recommend inpatient psychiatric hospitalization after medical hospitalization. Patient is under voluntary admission status at this time; please IVC if attempts to leave hospital.   Safety and Observation Level:  Based on my clinical evaluation, I estimate the patient to be considered high risk based in context of previous suicide attempt being greatest risk factor for subsequent suicide attempt, with highest risk within the first week; on the other hand, patient would be considered low risk based on no chronic mood symptoms at baseline as well as identification of several protective factors and reasons to live. At this time, we recommend a  direct level of observation on a 24 hour basis with constant sitter. This decision is based on my review of the chart including patient's history and current presentation, interview of the patient, mental status examination, and consideration of suicide risk including evaluating suicidal ideation, plan, intent, suicidal or self-harm behaviors, risk factors, and protective factors. This judgment is based on our ability to directly address suicide risk, implement suicide prevention strategies and develop a safety plan while the patient is in the clinical setting. Please contact our team if there is a concern that risk level has  changed.   Thank you for this consult request. Our recommendations are listed above.  We will continue to  follow patient's hospital course  Keene Breath, Medical Student   History Obtained on Interview   Patient Report (HPI):  Patient assessed at bedside on our first encounter followed admission for suicide attempt by intentional overdose with ~30 tablets of telmisartan 80 mg, part of her home medication for HTN management; confirmed previous HPI with patient with note of some inconsistencies. At time of interview, patient denies ongoing SI prior to suicide attempt. Patient does endorse persistently intermittent depressed mood, anger, anxiety, and irritability for 1 month but denies any SI or pre-meditation prior to suicide attempt other than in the moments directly leading up to OD; she identifies suicide attempt as a "spur of the moment" rash/impulsive decision, acting on sudden overwhelming intrusive thoughts that developed as a result of intensifying mood symptoms from current life stressors. Patient denies intent to die at the time of her decision, stating that it was rather a "cry for help" and an effort toward wanting others to understand what she is going through; patient reports realization that suicide attempt is not an effective or healthy method of achieving her goal at the time. Patient endorses regret for her decision to OD as well as gratitude and joy for being alive, again emphasizing that she "never truly wanted to not live." Patient states, " I took a bunch of my blood pressure medication because I just wanted it to make me sleepy and drowsy and get someone's attention, and I thought I would wake up in the hospital." Again, patient reports understanding the seriousness of her decision, which could have resulted in her death.   Patient identifies several protective factors throughout the interview that she endorses as reasons to live including her children and other family, friends,  pets, job, and other responsibilities. Patient recognizes the positive role she plays in many people's lives; she expresses regret and sadness for how her decision has negatively affected the people that love and care about her. 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 Patient also describes finding support in friends and family, including throughout the time leading up to OD. Patient endorses benefit from talking with friends, family, and therapy about her current stressors and talking through her experience and feelings with them; while patient does report benefit, she ultimately however remained emotionally overwhelmed by her current psychosocial stressors and felt that no one truly understood what she was going through, which again contributed to her intent behind her decision to OD.   In regards to the social stressors contributing to patient's current presentation and intentional overdose, patient identifies son's recent death and marital conflict as primary stressors. Patient reports difficulty dealing with her 59 year old son's death, which occurred in 11/20/22 from Fentanyl overdose. Patient further discusses her wife's recent ongoing struggle and challenges with her own poorly controlled bipolar disorder; patient discusses the negative impact this has had on their marriage/relationship. Patient states that her wife moved out of their home to her mother's home in [place] about 1 month prior to presentation to receive the support she needs for both psychiatric struggles as well as her own grieving from loss of son. Patient identifies wife moving out as the trigger for her worsening mental health. At that time (1 month ago), patient endorses onset of anger, depressed mood, decreased motivation, and social isolation; per patient's  report, these symptoms occur intermittently  but have worsened over the last month. She also describes feelings of loneliness and abandonment but denies hopelessness, worthlessness, and guilt. Patient further denies having depressed mood on most days for the majority of the day; she describes herself as overall "content" throughout the week, with recurrence of mood symptoms every weekend (2 days weekly). Patient denies anhedonia, stating that she is still enjoying work, talking/spending time with family and friends, and walking her dogs along with other activities she normally enjoys; she denies any impact of mood symptoms on her ability to socially or occupationally function, noting that all of her coworkers still "tell her she's funny" per usual. Patient further denies any changes in sleep and no appetite changes that she attributes to mood; she notes decreased appetite with almost 40 lbs intentional weight loss on Mounjaro for diabetes and weight management.   Patient also endorses significant anxiety related symptoms, which are notably more frequent per interview. Patient complains of ongoing and persistent excessive worrying about multiple that is most often difficulty to control, which she states "has been going on my whole life" referring to adolescence. Patient further endorses chronic restlessness, feeling on edge, nervousness, and feeling of impending doom; patient reports worrying more about bad things happening to others rather then herself but also shares several statements indicating concern for her own wellbeing such as "being bit by a snake when hiking" or experiencing serious injury or death when kayaking. Patient states these thoughts sometimes do prevent her from engaging in certain activities. Patient denies any history of panic attacks.   At time of encounter, patient denies current SI, HI, or AVH.    Psych ROS:  Depression: depressed mood, decreased motivation, social isolation/withdraw,  previous SI with suicide attempt Duration: > 2 weeks  Frequency: 2 days per week  Anxiety: prolonged/excessive worrying, worrying that is difficult to control, nervousness, restlessness, irritability, difficulty concentrating, sense of impending doom   Duration: since adolescence (> 30 years) Frequency: multiple times daily on most days of the week   Mania (lifetime and current): denies any symptoms of mania Psychosis (lifetime and current): denies any symptoms of psychosis  OCD: denies any symptoms of OCD  PTSD: denies any symptoms of PTSD       Psychiatric and Social History    Psychiatric History:  Information collected from patient and available chart review  Previous psychiatric diagnoses: unspecified anxiety and depression, ADHD Current med management provider: Kerin Salen MSPA, PA-C with [facility] (during past 2 years)  Current therapist: Hal Neer [facility], LCAS with [facility] (once every 2 weeks)  Home Meds (current):  Duloxetine 60 mg daily for depression and anxiety: patient reports benefit with previously improved motivation and prevention of social isolation, prior to current situation   Rexulti 1 mg daily for mood augmentation: patient reports benefit with previously improved motivation and prevention of social isolation, prior to current situation  Trazodone 100 mg nightly for sleep: patient reports improved sleep   Adderall 20 mg IR BID: patient reports improved focus, productivity, mental processing/understanding, and word finding; reports using once to twice daily Seroquel 50-100 mg nightly PRN for sleep and Klonopin 1 mg daily listed within home meds; patient fails to disclose current or previous use of these agents.   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  Legal Hx: no known history  Living Situation: lives alone in house; previously  Marital: married to wife for 4 years; previously married and divorced to ex-husband greater than 20 years ago  Children: 1 daughter (48 years old, Designer, multimedia), 78 son (93 years old, Jake); 1 stepson (wife's son, 4 years old, deceased)  Religion/Spiritual: spiritual  Legal: denies history  Hotel manager: denies history  Access to weapons: denies access to firearms/guns or other weapons within her home  Substance History: Alcohol: previous alcohol abuse, quit about 20 years ago  A fifth of vodka daily  Hx withdrawal tremors/shakes: does not know Hx alcohol related blackouts: denies Hx alcohol induced hallucinations: denies Hx alcoholic seizures: denies Hx delirium tremens (DTs): denies DUI: denies  --------  Tobacco: current and previous use Cigarettes: quit 10 years ago  Vape: uses 1 vape over a 2 week period with amount/frequency use varying daily based on mood and stressors  Cannabis (marijuana): previously used once weekly, quit about 20 years ago  Cocaine: tried ~5 times in high school (was not pleasurable experience; no use since) Methamphetamines: tried 1x in high school (was not pleasurable experience; no use since) Psilocybin (mushrooms): never tried Ecstasy (MDMA / molly): never tried Opiates (fentanyl / heroin): never tried Benzos (Xanax, Klonopin): Daily Xanax use x 10 years, quit 20 years ago  IV drug use: denies Prescribed meds abuse: denies  History of  hospitalization: Previous alcohol and xanax abuse required one hospitalization about 20 years ago with ~30 day admission.  History of detox: unsure; unable to locate documentation from reported admission  History of rehab: 6 week IOP program at [facility] with good results; patient has remained sober from alcohol and xanax since hospitalization followed with rehab with the exception of alcohol relapse on night of overdose.     Other History   These have been pulled in through the EMR, reviewed, and updated if appropriate.   Family History:  The patient's family history includes Cancer in her mother.  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   Procedure: RIGHT TOTAL KNEE ARTHROPLASTY;  Surgeon: Valeria Batman, MD;  Location: [facility];  Service: Orthopedics;  Laterality: Right;   TOTAL KNEE ARTHROPLASTY Left 07/04/2020   Procedure: LEFT TOTAL KNEE ARTHROPLASTY;  Surgeon: Valeria Batman, MD;  Location: [facility];  Service: Orthopedics;  Laterality: Left;    Medications:   Current Facility-Administered Medications:    0.9 %  sodium chloride infusion, , Intravenous, Continuous, Gherghe, Costin M, MD, Last Rate: 125 mL/hr at 03/28/23 1235, New Bag at 03/28/23 1235   brexpiprazole (REXULTI) tablet 1 mg, 1 mg, Oral, Daily, Segars, Christiane Ha, MD, 1 mg at 03/28/23 1029   [START ON 03/29/2023] DULoxetine (CYMBALTA) DR capsule 60 mg, 60 mg, Oral, Daily, Tan,  Reuel Boom, MD   LORazepam (ATIVAN) tablet 1-4 mg, 1-4 mg, Oral, Q4H PRN **OR** LORazepam (ATIVAN) injection 1-4 mg, 1-4 mg, Intramuscular, Q4H PRN, Augusto Gamble, MD   Clinical institute withdrawal assessment, , , Q4H **AND** thiamine (VITAMIN B1) tablet 100 mg,  100 mg, Oral, Daily, 100 mg at 03/28/23 1248 **AND** multivitamin with minerals tablet 1 tablet, 1 tablet, Oral, Daily, Augusto Gamble, MD, 1 tablet at 03/28/23 1248   sodium chloride flush (NS) 0.9 % injection 3 mL, 3 mL, Intravenous, Q12H, Dolly Rias, MD, 3 mL at 03/28/23 1240  Allergies: Allergies  Allergen Reactions   Other Cough, Itching and Other (See Comments)    Congestion,cough,itchy eyes   Sulfamethoxazole-Trimethoprim Hives and Other (See Comments)    Burning sensation and redness   Penicillins Itching     Exam Findings   Psychiatric Specialty Exam:  Presentation  General Appearance: Appropriate for Environment  Eye Contact:Fair  Speech:Clear and Coherent; Normal Rate  Speech Volume:Normal  Handedness:-- (not assessed)   Mood and Affect  Mood:Depressed  Affect:Congruent; Depressed; Restricted; Tearful   Thought Process  Thought Processes:Coherent; Linear  Descriptions of Associations:Intact  Orientation:Full (Time, Place and Person)  Thought Content:Abstract Reasoning; Logical (paranoia about her thoughts/thought processes ("people probably think I'm crazy when I talk about the things I think and worry about"))  Hallucinations:Hallucinations: None  Ideas of Reference:None  Suicidal Thoughts:Suicidal Thoughts: No  Homicidal Thoughts:Homicidal Thoughts: No   Sensorium  Memory:Immediate Good; Recent Good; Remote Fair  Judgment:Fair  Insight:Fair   Executive Functions  Concentration:Fair  Attention Span:Fair  Recall:Fair  Fund of Knowledge:Fair  Language:Good   Psychomotor Activity  Psychomotor Activity:Psychomotor Activity: Normal   Assets  Assets:Communication Skills; Desire for Improvement; Physical Health; Social Support; Vocational/Educational; Housing   Sleep  Sleep:Sleep: Good   Vital signs:  Temp:  [97.5 F (36.4 C)-98.2 F (36.8 C)] 98.2 F (36.8 C) (09/13 1117) Pulse Rate:  [66-96] 69 (09/13 1100) Resp:   [12-22] 16 (09/13 1143) BP: (93-132)/(51-95) 114/68 (09/13 1100) SpO2:  [88 %-100 %] 92 % (09/13 1100) Weight:  [103.9 kg] 103.9 kg (09/12 2156) Blood pressure 114/68, pulse 69, temperature 98.2 F (36.8 C), temperature source Oral, resp. rate 16, height 5\' 8"  (1.727 m), weight 103.9 kg, SpO2 92%. Body mass index is 34.82 kg/m.   Physical Exam: Physical Exam Constitutional:      General: She is not in acute distress.    Appearance: Normal appearance. She is not ill-appearing, toxic-appearing or diaphoretic.  HENT:     Head: Normocephalic and atraumatic.  Eyes:     Extraocular Movements: Extraocular movements intact.  Pulmonary:     Effort: Pulmonary effort is normal.  Musculoskeletal:        General: Normal range of motion.     Cervical back: Normal range of motion.  Neurological:     General: No focal deficit present.     Mental Status: She is alert. Mental status is at baseline.       Assessment & Plan Summary   Rule out history of major depressive disorder/current depressive episode vs adjustment disorder with mixed anxiety and depressed mood  Generalized anxiety disorder Restart home psychotropics as appropriate:  Start Cymbalta 60 mg daily for anxiety and depression  Start Rexulti 1 mg daily for mood augmentation  Start Trazodone 100 mg nightly for sleep  Hold Adderall 20 mg IR BID at this time  Inpatient psychiatric admission pending medical clearance  Will defer further medication management to inpatient psychiatric team unless otherwise  indicated  Plan to continue psychiatric assessment, important to note concern for possible minimization which would change patient's diagnostic assessment and possibly plan    History of polysubstance abuse Initiate precautions for alcohol withdrawal given significant history of alcohol abuse in the past with relapse on day of overdose CIWA protocol in place with Ativan  Start thiamine 100 mg daily for cerebellar/cerebral  protection  Start multivitamin tablet daily to ensure nutritional requirements met  Recommend further assessment, management, and support during IP psych admission    Signed:  Eula Flax, OMS-4 Psychiatry Acting Intern  9/13/20243:12 PM

## 2023-03-28 NOTE — ED Notes (Signed)
Pt belongings are in [facility] [address] & valuables with security.

## 2023-03-28 NOTE — ED Notes (Addendum)
[facility] MD notified of pt's downward trending BP at this time with current reading 84/57 after multiple checks.  Per provider, will administer 1L LR.

## 2023-03-28 NOTE — Social Work (Signed)
CSW taking over pt from the ED, chart reviewed. IVC paperwork completed by ED staff. Physical copies sent to the unit and are on the patient's chart. TOC will follow to assist with any disposition needs.

## 2023-03-28 NOTE — Plan of Care (Signed)

## 2023-03-28 NOTE — ED Notes (Signed)
Per Poison Control, recommend repeat Mag/Potassium level.

## 2023-03-28 NOTE — H&P (Signed)
History and Physical    Patient: Julia [name]:[id number] DOB: Nov 04, 1965 DOA: 03/27/2023 DOS: the patient was seen and examined on 03/28/2023 PCP: Nathaneil Canary, PA-C  Patient coming from: Home  Chief Complaint:  Chief Complaint  Patient presents with   Drug Overdose   Suicide Attempt   HPI: Julia [name] is a 57 y.o. female with medical history significant for major depressive disorder, HTN, HLD, and notable recent trauma with loss of her son few months ago and strained marital relationship with wife who has recently left their house last month. Presents after intentional overdose on approximately 30 tabs of Telimesartan 80 mg. Denies coingestion. States driven to OD due to her anger mostly at the strained relationship with her wife, feeling less supported, and dealing with worsening depression and the recent loss of her son to Fentanyl overdose. She denies intent on suicide but states just wanted someone else to see how bad she was hurting. No suicidal ideation or plan leading up to the overdose attempt. No prior suicide attempts. No prior psych hospitalizations. Has been in couples therapy and appears hopeful for future. Thinks her meds need to be adjusted, but not sure if truly worse depression or just dealing with her recent stressors.     Review of Systems: Complete and neg except above Past Medical History:  Diagnosis Date   Anxiety    Arthritis    knees, hands   Depression    Hypertension    Past Surgical History:  Procedure Laterality Date   ABDOMINAL HYSTERECTOMY     KNEE ARTHROSCOPY     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   Procedure: RIGHT TOTAL KNEE ARTHROPLASTY;  Surgeon: Valeria Batman, MD;  Location: [facility];  Service: Orthopedics;  Laterality: Right;   TOTAL KNEE ARTHROPLASTY Left 07/04/2020   Procedure: LEFT TOTAL KNEE ARTHROPLASTY;  Surgeon: Valeria Batman, MD;  Location: [facility];  Service: Orthopedics;  Laterality: Left;   Social History:   reports that she quit smoking about 10 years ago. Her smoking use included cigarettes. She started smoking about 35 years ago. She has a 25 pack-year smoking history. She has never used smokeless tobacco. She reports that she does not currently use alcohol. She reports that she does not use drugs.  Allergies  Allergen Reactions   Bactrim [Sulfamethoxazole-Trimethoprim] Hives   Penicillins Itching    Family History  Problem Relation Age of Onset   Cancer Mother     Prior to Admission medications   Medication Sig Start Date End Date Taking? Authorizing Provider  albuterol (VENTOLIN HFA) 108 (90 Base) MCG/ACT inhaler Inhale 2 puffs into the lungs every 6 (six) hours as needed for wheezing or shortness of breath. 09/09/22   Omar Person, MD  brexpiprazole (REXULTI) 1 MG TABS tablet Take 1 mg by mouth daily.    [provider]  clonazePAM (KLONOPIN) 1 MG tablet Take 1 mg by mouth See admin instructions. Take 1 tablet (1 mg) by mouth scheduled every evening, may take an additional dose during the day if needed for anxiety. 01/21/19   [provider]  diclofenac Sodium (VOLTAREN) 1 % GEL Apply 2-4 g topically 4 (four) times daily as needed. 07/04/20   Jetty Peeks, PA-C  DULoxetine (CYMBALTA) 30 MG capsule Take 30 mg by mouth daily.    [provider]  DULoxetine (CYMBALTA) 60 MG capsule Take 60 mg by mouth daily.    [provider]  telmisartan (MICARDIS) 80 MG tablet  Take 80 mg by mouth daily.    [provider]  Tiotropium Bromide-Olodaterol (STIOLTO RESPIMAT) 2.5-2.5 MCG/ACT AERS Inhale 2 puffs into the lungs daily. 09/26/22   Omar Person, MD  traZODone (DESYREL) 100 MG tablet Take 100 mg by mouth at bedtime.    [provider]    Physical Exam: Vitals:   03/28/23 0345 03/28/23 0430 03/28/23 0445 03/28/23 0500  BP: 112/75 100/70 105/70 93/81  Pulse: 78 76 77 77  Resp: 18 18 (!) 21 20  Temp:      TempSrc:      SpO2: 93%  98% 93% 92%  Weight:      Height:        Gen: Awake, alert, NAD  CV: Regular, normal S1, S2, no murmurs  Resp: Normal WOB, CTAB  Abd: Flat, normoactive, nontender MSK: Symmetric, no edema  Skin: No rashes or lesions to exposed skin  Neuro: Alert and interactive  Psych: mood is depressed, affect is full but sad. Good insight. Impulsive. Denies SI despite OD.   Data Reviewed:   K low at 3    Assessment and Plan:  68 F with hx major depressive disorder, HTN, HLD, and notable recent trauma with loss of her son few months ago and strained marital relationship with wife who has recently left their house last month. Presents after intentional overdose on approximately 30 tabs of Telmisartan 80 mg  Intentional overdose on ARB medication, suicidal gesture  Hypotension, medication effect  Major depressive episode Likely superimposed adjustment, complex bereavement  - ED discussed with poison control, recommending observation through 10 AM per ED report  - OD c/b hypotensive episode after admission, improved after 1 L IVF  - Continue BP monitoring, tele - If remains stable without further hypotensive episodes would be medically clear  - Psychiatry consult order placed, patient aware and amenable  - Suicide precautions  - Continue home Ruxulti, and Duloxetine, trazodone  AHRF - likely resolved  Reportedly desat to 89% and requiring 4L with EMS. During interview was 100% on RA. Question hypoventilation, OHS. Also risk for OSA. CXR neg for acute  - Wean off O2   Hypokalemia, asymptomatic - repleted   Chronic medical problems:  HTN: Hold ARB in setting of above     Advance Care Planning:   Code Status: Full Code ; Confirmed with patient   Consults: Psych   Family Communication: No   Severity of Illness: The appropriate patient status for this patient is OBSERVATION. Observation status is judged to be reasonable and necessary in order to provide the required intensity of service  to ensure the patient's safety. The patient's presenting symptoms, physical exam findings, and initial radiographic and laboratory data in the context of their medical condition is felt to place them at decreased risk for further clinical deterioration. Furthermore, it is anticipated that the patient will be medically stable for discharge from the hospital within 2 midnights of admission.   Author: Dolly Rias, MD 03/28/2023 5:18 AM  For on call review [email or web address].

## 2023-03-28 NOTE — ED Notes (Signed)
Psychiatry at bedside.

## 2023-03-28 NOTE — Progress Notes (Addendum)
PROGRESS NOTE  Julia [name] ZOX:[id number] DOB: [birth year] DOA: 03/27/2023 PCP: Nathaneil Canary, PA-C   LOS: 0 days   Brief Narrative / Interim history: 57 year old female with history of anxiety, depression, HTN comes into the hospital with intentional overdose on telmisartan.  She has been having significant life stressors recently with loss of her son as well as marital problems, and tells me she got very angry and had more than 30 pills of telmisartan.  She told one of her sons who in turn activated EMS  Subjective / 24h Interval events: Patient seen and examined this morning, she currently feels well.  She denies any nausea or vomiting.  She denies any abdominal pain.  No lightheadedness or dizziness.  No chest pain, no shortness of breath.  She reports adequate urination overnight  Assesement and Plan: Principal Problem:   Intentional overdose (HCC) Active Problems:   Major depressive episode   Acute hypoxic respiratory failure (HCC)   Hypokalemia   Principal problem Intentional overdose -with telmisartan, patient cannot really quantify but told me she had 2 fist fulls of pills.  Denies coingestion.  Continue to closely monitor on telemetry, continue IV fluids.  Poison control involved -Denies suicide attempt, told me it was an impulsive move in the setting of being angry, however is unclear whether she thought about consequences -Psychiatry consulted, appreciate input  Active problems Acute kidney injury -due to overdose.  Creatinine 0.7 last night, increased to 1.5 this morning.  Case briefly discussed with nephrology, who recommended conservative management for now with IV fluids and close monitoring.  Addendum 6:45 pm. Creatinine continues to rise sharply. Discussed with nephrology, continue supportive care. No role for HD at this juncture  Hypokalemia-potassium 3.0 on admission, received 40 mEq and increased to 4.3 this morning, which is more than expected.  High risk of  developing hyperkalemia given sudden rise in creatinine.  Recheck potassium this afternoon.  Magnesium normal at 1.9  Elevated LFTs -also suspect related to #1, recheck LFTs this afternoon  Obesity, class I-based on a BMI of 34  Major depressive disorder-Per psychiatry  Scheduled Meds:  brexpiprazole  1 mg Oral Daily   DULoxetine  30 mg Oral Daily   DULoxetine  60 mg Oral Daily   sodium chloride flush  3 mL Intravenous Q12H   Continuous Infusions:  sodium chloride     PRN Meds:.  Current Outpatient Medications  Medication Instructions   albuterol (VENTOLIN HFA) 108 (90 Base) MCG/ACT inhaler 2 puffs, Inhalation, Every 6 hours PRN   amphetamine-dextroamphetamine (ADDERALL) 20 MG tablet 20 mg, Oral, 2 times daily   brexpiprazole (REXULTI) 1 mg, Oral, Daily   clonazePAM (KLONOPIN) 1 mg, Oral, See admin instructions, Take 1 tablet (1 mg) by mouth scheduled every evening, may take an additional dose during the day if needed for anxiety.   diclofenac Sodium (VOLTAREN) 2-4 g, Topical, 4 times daily PRN   DULoxetine (CYMBALTA) 60 mg, Oral, Daily   Mounjaro 10 mg, Subcutaneous, Weekly   QUEtiapine (SEROQUEL) 50-100 mg, Oral, At bedtime PRN   telmisartan (MICARDIS) 80 mg, Oral, Daily   Tiotropium Bromide-Olodaterol (STIOLTO RESPIMAT) 2.5-2.5 MCG/ACT AERS 2 puffs, Inhalation, Daily   traZODone (DESYREL) 100 mg, Daily at bedtime     DVT prophylaxis:    Lab Results  Component Value Date   PLT 308 03/27/2023      Code Status: Full Code  Family Communication: no family at bedside   Status is: Observation The patient will require care spanning >  2 midnights and should be moved to inpatient because: AKI, overdose   Level of care: Telemetry Medical  Consultants:  Psychiatry  Objective: Vitals:   03/28/23 0500 03/28/23 0515 03/28/23 0618 03/28/23 0700  BP: 93/81 (!) 109/95 (!) 121/51 122/78  Pulse: 77 96 79 74  Resp: 20 19 16    Temp:   (!) 97.5 F (36.4 C)   TempSrc:    Oral   SpO2: 92% 93% 93% 92%  Weight:      Height:        Intake/Output Summary (Last 24 hours) at 03/28/2023 0854 Last data filed at 03/28/2023 0616 Gross per 24 hour  Intake 1000 ml  Output --  Net 1000 ml   Wt Readings from Last 3 Encounters:  03/27/23 103.9 kg  12/11/22 116.1 kg  09/09/22 123.4 kg    Examination:  Constitutional: NAD Eyes: no scleral icterus ENMT: Mucous membranes are moist.  Neck: normal, supple Respiratory: clear to auscultation bilaterally, no wheezing, no crackles. Normal respiratory effort. No accessory muscle use.  Cardiovascular: Regular rate and rhythm, no murmurs / rubs / gallops. No LE edema.  Abdomen: non distended, no tenderness. Bowel sounds positive.  Musculoskeletal: no clubbing / cyanosis.   Data Reviewed: I have independently reviewed following labs and imaging studies   CBC Recent Labs  Lab 03/27/23 2225  WBC 5.7  HGB 13.9  HCT 41.8  PLT 308  MCV 104.8*  MCH 34.8*  MCHC 33.3  RDW 14.3  LYMPHSABS 1.5  MONOABS 0.3  EOSABS 0.2  BASOSABS 0.1    Recent Labs  Lab 03/27/23 2225 03/28/23 0614  NA 141 140  K 3.0* 4.3  CL 102 103  CO2 25 28  GLUCOSE 117* 112*  BUN 10 9  CREATININE 0.77 1.53*  CALCIUM 9.2 8.7*  AST 94*  --   ALT 121*  --   ALKPHOS 63  --   BILITOT 0.9  --   ALBUMIN 4.0  --   MG  --  1.9    ------------------------------------------------------------------------------------------------------------------ No results for input(s): "CHOL", "HDL", "LDLCALC", "TRIG", "CHOLHDL", "LDLDIRECT" in the last 72 hours.  No results found for: "HGBA1C" ------------------------------------------------------------------------------------------------------------------ No results for input(s): "TSH", "T4TOTAL", "T3FREE", "THYROIDAB" in the last 72 hours.  Invalid input(s): "FREET3"  Cardiac Enzymes No results for input(s): "CKMB", "TROPONINI", "MYOGLOBIN" in the last 168 hours.  Invalid input(s):  "CK" ------------------------------------------------------------------------------------------------------------------ No results found for: "BNP"  CBG: No results for input(s): "GLUCAP" in the last 168 hours.  No results found for this or any previous visit (from the past 240 hour(s)).   Radiology Studies: DG Chest Portable 1 View  Result Date: 03/27/2023 CLINICAL DATA:  Shortness of breath, evaluate for aspiration. EXAM: PORTABLE CHEST 1 VIEW COMPARISON:  Chest x-ray 12/03/2019. FINDINGS: The heart size and mediastinal contours are within normal limits. Both lungs are clear. The visualized skeletal structures are unremarkable. IMPRESSION: No active disease. Electronically Signed   By: Darliss Cheney M.D.   On: 03/27/2023 23:27     Pamella Pert, MD, PhD [facility]  Between 7 am - 7 pm I am available, please contact me via Amion (for emergencies) or Securechat (non urgent messages)  Between 7 pm - 7 am I am not available, please contact night coverage MD/APP via Amion

## 2023-03-28 NOTE — ED Notes (Signed)
Pt moved to room 3, sitter with patient, NAD noted at this time.

## 2023-03-28 NOTE — ED Notes (Signed)
Patient ambulatory to bathroom.

## 2023-03-29 ENCOUNTER — Inpatient Hospital Stay (HOSPITAL_COMMUNITY): Payer: Managed Care, Other (non HMO)

## 2023-03-29 DIAGNOSIS — F4323 Adjustment disorder with mixed anxiety and depressed mood: Secondary | ICD-10-CM | POA: Diagnosis not present

## 2023-03-29 LAB — BASIC METABOLIC PANEL
Anion gap: 10 (ref 5–15)
BUN: 20 mg/dL (ref 6–20)
CO2: 21 mmol/L — ABNORMAL LOW (ref 22–32)
Calcium: 8.4 mg/dL — ABNORMAL LOW (ref 8.9–10.3)
Chloride: 108 mmol/L (ref 98–111)
Creatinine, Ser: 3.37 mg/dL — ABNORMAL HIGH (ref 0.44–1.00)
GFR, Estimated: 15 mL/min — ABNORMAL LOW (ref >60)
Glucose, Bld: 124 mg/dL — ABNORMAL HIGH (ref 70–99)
Potassium: 4.5 mmol/L (ref 3.5–5.1)
Sodium: 139 mmol/L (ref 135–145)

## 2023-03-29 LAB — CBC
HCT: 39.2 % (ref 36.0–46.0)
Hemoglobin: 13.2 g/dL (ref 12.0–15.0)
MCH: 35.8 pg — ABNORMAL HIGH (ref 26.0–34.0)
MCHC: 33.7 g/dL (ref 30.0–36.0)
MCV: 106.2 fL — ABNORMAL HIGH (ref 80.0–100.0)
Platelets: 248 10*3/uL (ref 150–400)
RBC: 3.69 MIL/uL — ABNORMAL LOW (ref 3.87–5.11)
RDW: 14.6 % (ref 11.5–15.5)
WBC: 8.2 10*3/uL (ref 4.0–10.5)
nRBC: 0 % (ref 0.0–0.2)

## 2023-03-29 LAB — COMPREHENSIVE METABOLIC PANEL
ALT: 79 U/L — ABNORMAL HIGH (ref 0–44)
AST: 53 U/L — ABNORMAL HIGH (ref 15–41)
Albumin: 3.4 g/dL — ABNORMAL LOW (ref 3.5–5.0)
Alkaline Phosphatase: 57 U/L (ref 38–126)
Anion gap: 10 (ref 5–15)
BUN: 16 mg/dL (ref 6–20)
CO2: 25 mmol/L (ref 22–32)
Calcium: 8.7 mg/dL — ABNORMAL LOW (ref 8.9–10.3)
Chloride: 104 mmol/L (ref 98–111)
Creatinine, Ser: 3.14 mg/dL — ABNORMAL HIGH (ref 0.44–1.00)
GFR, Estimated: 17 mL/min — ABNORMAL LOW (ref >60)
Glucose, Bld: 95 mg/dL (ref 70–99)
Potassium: 4.6 mmol/L (ref 3.5–5.1)
Sodium: 139 mmol/L (ref 135–145)
Total Bilirubin: 0.8 mg/dL (ref 0.3–1.2)
Total Protein: 6 g/dL — ABNORMAL LOW (ref 6.5–8.1)

## 2023-03-29 LAB — LIPID PANEL
Cholesterol: 150 mg/dL (ref 0–200)
HDL: 48 mg/dL (ref >40)
LDL Cholesterol: 84 mg/dL (ref 0–99)
Total CHOL/HDL Ratio: 3.1 ratio
Triglycerides: 88 mg/dL (ref <150)
VLDL: 18 mg/dL (ref 0–40)

## 2023-03-29 LAB — URINALYSIS, ROUTINE W REFLEX MICROSCOPIC
Bilirubin Urine: NEGATIVE
Glucose, UA: NEGATIVE mg/dL
Ketones, ur: NEGATIVE mg/dL
Leukocytes,Ua: NEGATIVE
Nitrite: NEGATIVE
Protein, ur: NEGATIVE mg/dL
Specific Gravity, Urine: <1.005 — ABNORMAL LOW (ref 1.005–1.030)
pH: 6 (ref 5.0–8.0)

## 2023-03-29 LAB — HEMOGLOBIN A1C
Hgb A1c MFr Bld: 6 % — ABNORMAL HIGH (ref 4.8–5.6)
Mean Plasma Glucose: 126 mg/dL

## 2023-03-29 LAB — URINALYSIS, MICROSCOPIC (REFLEX): Bacteria, UA: NONE SEEN

## 2023-03-29 LAB — MAGNESIUM: Magnesium: 2 mg/dL (ref 1.7–2.4)

## 2023-03-29 MED ORDER — DULOXETINE HCL 60 MG PO CPEP
120.0000 mg | ORAL_CAPSULE | Freq: Every day | ORAL | Status: DC
  Administered 2023-03-30: 120 mg via ORAL
  Filled 2023-03-29: qty 2

## 2023-03-29 NOTE — Progress Notes (Signed)
PROGRESS NOTE  Julia [name] ZOX:[id number] DOB: 08/18/1965 DOA: 03/27/2023 PCP: Nathaneil Canary, PA-C   LOS: 1 day   Brief Narrative / Interim history: 57 year old female with history of anxiety, depression, HTN comes into the hospital with intentional overdose on telmisartan.  She has been having significant life stressors recently with loss of her son as well as marital problems, and tells me she got very angry and had more than 30 pills of telmisartan.  She told one of her sons who in turn activated EMS  Subjective / 24h Interval events: Feels well this morning, denies any chest pain, no lightheadedness, no nausea or vomiting.  No palpitations.  Assesement and Plan: Principal Problem:   Adjustment disorder with mixed anxiety and depressed mood Active Problems:   Intentional overdose (HCC)   MDD (major depressive disorder), recurrent (HCC)   Acute hypoxic respiratory failure (HCC)   Hypokalemia   Alcohol use disorder, severe (HCC)   Tobacco use disorder   Generalized anxiety disorder   Principal problem Intentional overdose -with telmisartan, patient cannot really quantify but told me she had 2 fist fulls of pills.  Denies coingestion.  Continue to closely monitor on telemetry, continue IV fluids.  Poison control involved -Psych consulted, recommending inpatient psych once medically cleared.  Active problems Acute kidney injury -due to overdose.  Creatinine increasing rapidly but appears to have plateaued. This morning.  He was 0.7 on admission, currently at 3.1 -Discussed with nephrology, continue supportive care, continue fluids.  She has no acute indications for dialysis  Hypokalemia-potassium now normal in the setting of AKI.  At risk for hyperkalemia.  Elevated LFTs -also suspect related to #1, gradually improving now  Obesity, class I-based on a BMI of 34  Major depressive disorder-Per psychiatry  Scheduled Meds:  brexpiprazole  1 mg Oral Daily   DULoxetine  60 mg  Oral Daily   thiamine  100 mg Oral Daily   And   multivitamin with minerals  1 tablet Oral Daily   sodium chloride flush  3 mL Intravenous Q12H   Continuous Infusions:  sodium chloride 125 mL/hr at 03/29/23 0449   PRN Meds:.LORazepam **OR** LORazepam, traZODone  Current Outpatient Medications  Medication Instructions   albuterol (VENTOLIN HFA) 108 (90 Base) MCG/ACT inhaler 2 puffs, Inhalation, Every 6 hours PRN   amphetamine-dextroamphetamine (ADDERALL) 20 MG tablet 20 mg, Oral, 2 times daily   brexpiprazole (REXULTI) 1 mg, Oral, Daily   clonazePAM (KLONOPIN) 1 mg, Oral, See admin instructions, Take 1 tablet (1 mg) by mouth scheduled every evening, may take an additional dose during the day if needed for anxiety.   diclofenac Sodium (VOLTAREN) 2-4 g, Topical, 4 times daily PRN   DULoxetine (CYMBALTA) 120 mg, Oral, Daily   Mounjaro 10 mg, Subcutaneous, Weekly   QUEtiapine (SEROQUEL) 50-100 mg, Oral, At bedtime PRN   telmisartan (MICARDIS) 80 mg, Oral, Daily   Tiotropium Bromide-Olodaterol (STIOLTO RESPIMAT) 2.5-2.5 MCG/ACT AERS 2 puffs, Inhalation, Daily   traZODone (DESYREL) 100 mg, Daily at bedtime   Diet Orders (From admission, onward)     Start     Ordered   03/28/23 1704  Diet regular Room service appropriate? Yes; Fluid consistency: Thin  Diet effective now       Comments: Plastic supplies only-Safety tray  Question Answer Comment  Room service appropriate? Yes   Fluid consistency: Thin      03/28/23 1704            DVT prophylaxis:    Lab  Results  Component Value Date   PLT 248 03/29/2023      Code Status: Full Code  Family Communication: no family at bedside   Status is: Inpatient  Level of care: Telemetry Medical  Consultants:  Psychiatry  Objective: Vitals:   03/28/23 1143 03/28/23 1646 03/28/23 2010 03/29/23 0824  BP:  131/68 (!) 151/85 (!) 149/85  Pulse:  75 89 75  Resp: 16 18 18 18   Temp:  (!) 97.5 F (36.4 C) 98.2 F (36.8 C) 97.7 F  (36.5 C)  TempSrc:  Oral Oral Oral  SpO2:  95% 97% 97%  Weight:      Height:        Intake/Output Summary (Last 24 hours) at 03/29/2023 0945 Last data filed at 03/29/2023 0850 Gross per 24 hour  Intake 523.69 ml  Output --  Net 523.69 ml   Wt Readings from Last 3 Encounters:  03/27/23 103.9 kg  12/11/22 116.1 kg  09/09/22 123.4 kg    Examination:  Constitutional: NAD Eyes: lids and conjunctivae normal, no scleral icterus ENMT: mmm Neck: normal, supple Respiratory: clear to auscultation bilaterally, no wheezing, no crackles. Normal respiratory effort.  Cardiovascular: Regular rate and rhythm, no murmurs / rubs / gallops. No LE edema. Abdomen: soft, no distention, no tenderness. Bowel sounds positive.   Data Reviewed: I have independently reviewed following labs and imaging studies   CBC Recent Labs  Lab 03/27/23 2225 03/29/23 0219  WBC 5.7 8.2  HGB 13.9 13.2  HCT 41.8 39.2  PLT 308 248  MCV 104.8* 106.2*  MCH 34.8* 35.8*  MCHC 33.3 33.7  RDW 14.3 14.6  LYMPHSABS 1.5  --   MONOABS 0.3  --   EOSABS 0.2  --   BASOSABS 0.1  --     Recent Labs  Lab 03/27/23 2225 03/28/23 0614 03/28/23 1456 03/28/23 1754 03/29/23 0219  NA 141 140  --  137 139  K 3.0* 4.3  --  4.6 4.6  CL 102 103  --  104 104  CO2 25 28  --  24 25  GLUCOSE 117* 112*  --  101* 95  BUN 10 9  --  13 16  CREATININE 0.77 1.53*  --  2.48* 3.14*  CALCIUM 9.2 8.7*  --  8.7* 8.7*  AST 94*  --   --  67* 53*  ALT 121*  --   --  94* 79*  ALKPHOS 63  --   --  57 57  BILITOT 0.9  --   --  0.9 0.8  ALBUMIN 4.0  --   --  3.7 3.4*  MG  --  1.9  --  2.0 2.0  TSH  --   --  0.922  --   --   HGBA1C  --   --  6.0*  --   --     ------------------------------------------------------------------------------------------------------------------ Recent Labs    03/29/23 0219  CHOL 150  HDL 48  LDLCALC 84  TRIG 88  CHOLHDL 3.1    Lab Results  Component Value Date   HGBA1C 6.0 (H) 03/28/2023    ------------------------------------------------------------------------------------------------------------------ Recent Labs    03/28/23 1456  TSH 0.922    Cardiac Enzymes No results for input(s): "CKMB", "TROPONINI", "MYOGLOBIN" in the last 168 hours.  Invalid input(s): "CK" ------------------------------------------------------------------------------------------------------------------ No results found for: "BNP"  CBG: No results for input(s): "GLUCAP" in the last 168 hours.  No results found for this or any previous visit (from the past 240 hour(s)).   Radiology  Studies: No results found.   Pamella Pert, MD, PhD [facility]  Between 7 am - 7 pm I am available, please contact me via Amion (for emergencies) or Securechat (non urgent messages)  Between 7 pm - 7 am I am not available, please contact night coverage MD/APP via Amion

## 2023-03-29 NOTE — Consult Note (Signed)
[name] New Face-to-Face Psychiatric Consult Evaluation   [name]  Service Date: March 29, 2023 LOS:  LOS: 1 day    Primary Psychiatric Diagnoses  Rule out history of major depressive disorder/current depressive episode vs adjustment disorder with mixed anxiety and depressed mood  2.  Generalized anxiety disorder 3.  History of polysubstance abuse  Assessment  Jul[name] is a 57 y.o. female admitted medically for 03/27/2023  9:43 PM for medical and psychiatric management following intentional overdose. She carries the psychiatric diagnoses of anxiety, depression, and ADHD and has a past medical history of  HTN, diabetes, and arthritis. Psychiatry was consulted for "intentional overdose, major depressive episode" by Dr. Lazarus Salines.  Current outpatient psychotropic medications include duloxetine 60 mg daily, rexulti 1 mg daily, adderall 20 mg daily, and trazodone 100 mg nightly and historically she has had a therapeutic response to these medications as described below. She was compliant with medications prior to admission as evidenced by patient report.   9/13: On initial examination, patient was cooperative and interacted with interviewer as appropriate. Mood was depressed with congruent affect and tearfulness; affect not entirely flat but range appeared to be somewhat restricted by mood. Patient demonstrated fair judgement and insight throughout most of the encounter. There was no evidence of psychosis or major neurological deficits.    03/29/2023 Patient seen laying in bed on my approach this afternoon. She reports that she continues to feel depressed and she has not seen her wife since being hospitalized. She has been eating and sleeping well. She mentions that she takes 120 mg of Cymbalta at home and she would like to go back to her home dose. She denies any current SI/HI/AVH.  Per chart review, patient presents with history of  unspecified depressive disorder. Given patient's home regimen including Cymbalta 60 mg daily and Rexulti 1 mg daily as prescribed by patient's medication management provider at current, it is reasonable to have high suspicion that patient previously met criteria for major depressive disorder. However, given limited access to documented psychiatric history and incomplete patient report, there is not enough evidence to unquestionably state that patient has previously as a matter of fact met criteria for MDD. Based on current presentation and subjective report, patient does not meet full blown criteria for MDD at this time; however there is also some suspicion for minimization of symptoms. If previous MDD diagnosis is true and patient's report is accurate, patient still does not meet criteria for a depressive episode at this time but rather would be in partial remission. Again, given limited information and concern for possible minimization, strongly recommend further exploration and suggest contacting patient's psychiatric care team at [facility] as there is no access to their documentation at this time.   Chart review also demonstrates previously diagnosed unspecified anxiety disorder. Again, cannot speak to whether patient previously met criteria for a more specified anxiety diagnosis. Regardless, at this time does meet criteria for generalized anxiety disorder based on initial encounter: patient endorses excessive worrying about many things on most days for greater than 6 months with difficult controlling worrying along with restlessness/on edge feeling, irritability, and difficulty concentrating; patient has experienced significant distress as a result of anxiety related symptoms.   In regards to reason for admission and psychiatric consult, patient reports that overdose was a cry for help with no intention or want to actually kill herself; patient further reports that she wanted  other people to understand  what she is experiencing, and this was part of her attempt to achieve that. In short, patient attributes her decision to distress that has resulted from marital conflict and death of child as documented below. Patient does endorse remorse and regret for her decision, she states that she is happy to be alive, and she does demonstrate insight that while her intent was not suicidal, the decision was an unhealthy maladaptive coping mechanism and does necessitate inpatient psychiatric admission as all forms of suicide attempt are treated with grave seriousness. Patient remains agreeable to and understanding of the need for inpatient psychiatric admission at this time; IVC will be pursued if patient were to no longer consent. At this time, consider patient's overall risk for suicide to be moderate based on the following: previous suicide attempt is the greatest risk factor for subsequent attempts, which is highest within the first week, which classifies patients as high risk; on the other hand, patient's lack of chronic depression and anxiety at baseline (again based on today's interview which may not be fully accurate) as well as her substantial number of protective factors, which she identifies as reasons to live, would make her low risk. Based on this reasoning, again at this time patient is considered moderate risk. Psychiatry consult and inpatient teams will continue to assess for suicidality and plan for safety.   Prior to medical clearance and under care of psychiatric consult team, home medications were restarted as appropriate as follows: Cymbalta 60 mg daily for depression and anxiety, Rixulti for further mood augmentation, and Trazodone 100 mg for sleep. Adderall held at this time. Likewise, recommend continue holding of drug used for overdose (telmisartan). Defer further med management to inpatient psychiatric team at this time unless otherwise indicated.    Please see plan  below for detailed recommendations.    Suicide risk assessment:  Patient has following modifiable risk factors for suicide: social isolation (lives alone x 1 month but does interact with family, friends, and coworkers outside of residence of living), which we are addressing with planned inpatient psychiatric admission for further assessment, medication management, safety planning, and psychosocial support +/- substance abuse resources.   Patient has following non-modifiable or demographic risk factors for suicide: history of suicide attempt and psychiatric hospitalization  Patient has the following protective factors against suicide: Access to outpatient mental health care, Supportive family, Supportive friends, and Pets in the home   Diagnoses:  Active Hospital problems: Principal Problem:   Adjustment disorder with mixed anxiety and depressed mood Active Problems:   Intentional overdose (HCC)   MDD (major depressive disorder), recurrent (HCC)   Acute hypoxic respiratory failure (HCC)   Hypokalemia   Alcohol use disorder, severe (HCC)   Tobacco use disorder   Generalized anxiety disorder     Relevant Aspects of Hospital Course:  Presented via EMS from home and admitted on 03/27/2023 after overdose on ARB as cry for help without actual intent of suicide. On initial presentation to ED, SpO2 was 88% on RA at which time she was placed on 4L , indicating acute hypoxic respiratory failure. CXR reassuring with no evidence of acute pathology. Oxygenation improved, and patient resumed on RA with saturation remaining WNL. Internal medicine consulted early in admission and documented suspicion for hypoventilation, OHS, and OSA in correlation to previous hypoxia; in agreement for further assessment, especially given these conditions can contribute to psychiatric presentation. Abnormal labs results of significance include ethanol 54 and potassium 3; potassium repleted. Notably, UDS, salicylates, and  tylenol negative  and there was no objective evidence at that time of additional co-ingestion, consistent with patient's subjective report. Activated charcoal was administered for overdose.  Poison control consulted and recommended repeat potassium level with magnesium, which were WNL. Patient maintained on cardiac monitor which demonstrated sinus rhythm, consistent with EKG though there were borderline T wave abnormalities in the anterior leads with borderline prolonged QT interval; QTc WNL. While BP initially stable, patient became hypotensive throughout initial workup and received 1L LR with BP stabilization. Internal medicine and documented that hypotension is result of the medication patient overdosed on, which is an antihypertensive; telmisartan held given it was the drug of overdose. BP monitoring was continued and psychiatry consulted. Inpatient psychiatric admission pending medical clearance; plan for clearance if BP remains stable without further clinically significant hypotensive episodes. Patient had sitter throughout admission while awaiting medical clearance. Acute kidney injury emerged during this time with increased creatine/decreased GFR on repeat CMP; nephrology consulted and recommended conservative management with IV fluids and close monitoring. Nephrology also noted that increase in potassium, though WNL, was more than expected; noted high risk of developing hyperkalemia given sudden rise in creatinine. Electrolytes and renal function continued to be followed on routinely repeated CMP. Similar elevation in LFTs on subsequent labs; again suspect relation to overdose and will continue to follow.    Plan & Recommendations   1. Psychiatric Medication Recommendations:  Restart home psychotropics as appropriate:  Start Cymbalta 60 mg daily for anxiety and depression  Start Rexulti 1 mg daily for mood augmentation  Start Trazodone 100 mg nightly for sleep  Hold Adderall 20 mg IR BID at this  time  Initiate precautions for alcohol withdrawal given significant history of alcohol abuse in the past with relapse on day of overdose CIWA protocol in place with Ativan  Start thiamine 100 mg daily for cerebellar/cerebral protection  Start multivitamin tablet daily to ensure nutritional requirements met   2. Medical Decision Making Capacity:  Patient determined to have capacity to make decisions regarding psychiatric medication management at this time   3. Further Work-up:  Atypical antipsychotic monitoring per guidelines  TSH: ordered  Lipid profile: ordered  HbA1c: ordered  EKG 03/27/23: QtC 488 Recommend TOC consult for substance abuse resources - reasonable to defer to inpatient psychiatric team    Pertinent labwork reviewed earlier this admission includes:  Potassium: 3.0 (low) -> 4.3 (WNL) Creatinine: 0.77 (WNL) -> 1.53 (elevated)  GFR: > 60 (WNL) -> 40 (low) UDS, tylenol level, and salicylate level: WNL BAL: 54 (elevated)   4. Behavioral / Environmental:  Recommend 24 hour sitting in setting of recent suicide attempt    5. Legal Status Patient voluntary admission at this time, will IVC if necessary as noted below    Disposition:  We recommend inpatient psychiatric hospitalization after medical hospitalization. Patient is under voluntary admission status at this time; please IVC if attempts to leave hospital.   Safety and Observation Level:  Based on my clinical evaluation, I estimate the patient to be considered high risk based in context of previous suicide attempt being greatest risk factor for subsequent suicide attempt, with highest risk within the first week; on the other hand, patient would be considered low risk based on no chronic mood symptoms at baseline as well as identification of several protective factors and reasons to live. At this time, we recommend a  direct level of observation on a 24 hour basis with constant sitter. This decision is based on my  review of the chart  including patient's history and current presentation, interview of the patient, mental status examination, and consideration of suicide risk including evaluating suicidal ideation, plan, intent, suicidal or self-harm behaviors, risk factors, and protective factors. This judgment is based on our ability to directly address suicide risk, implement suicide prevention strategies and develop a safety plan while the patient is in the clinical setting. Please contact our team if there is a concern that risk level has changed.   Thank you for this consult request. Our recommendations are listed above.  We will continue to follow patient's hospital course  Harlin Heys, DO   History Obtained on Interview   Patient Report (HPI):  Patient assessed at bedside on our first encounter followed admission for suicide attempt by intentional overdose with ~30 tablets of telmisartan 80 mg, part of her home medication for HTN management; confirmed previous HPI with patient with note of some inconsistencies. At time of interview, patient denies ongoing SI prior to suicide attempt. Patient does endorse persistently intermittent depressed mood, anger, anxiety, and irritability for 1 month but denies any SI or pre-meditation prior to suicide attempt other than in the moments directly leading up to OD; she identifies suicide attempt as a "spur of the moment" rash/impulsive decision, acting on sudden overwhelming intrusive thoughts that developed as a result of intensifying mood symptoms from current life stressors. Patient denies intent to die at the time of her decision, stating that it was rather a "cry for help" and an effort toward wanting others to understand what she is going through; patient reports realization that suicide attempt is not an effective or healthy method of achieving her goal at the time. Patient endorses regret for her decision to OD as well as gratitude and joy for being alive,  again emphasizing that she "never truly wanted to not live." Patient states, " I took a bunch of my blood pressure medication because I just wanted it to make me sleepy and drowsy and get someone's attention, and I thought I would wake up in the hospital." Again, patient reports understanding the seriousness of her decision, which could have resulted in her death.   Patient identifies several protective factors throughout the interview that she endorses as reasons to live including her children and other family, friends, pets, job, and other responsibilities. Patient recognizes the positive role she plays in many people's lives; she expresses regret and sadness for how her decision has negatively affected the people that love and care about her. 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 Patient also describes finding support in friends and family, including throughout the time leading up to OD. Patient endorses benefit from talking with friends, family, and therapy about her current stressors and talking through her experience and feelings with them; while patient does report benefit, she ultimately however remained emotionally overwhelmed by her current psychosocial stressors and felt that no one truly understood what she was going through, which again contributed to her intent behind her decision to OD.   In regards to the social stressors contributing to patient's current presentation and intentional overdose, patient identifies son's recent death and marital conflict as primary stressors. Patient reports difficulty dealing with her 9 year old son's death, which occurred in November 09, 2022 from Fentanyl overdose. Patient further discusses her wife's recent ongoing struggle and challenges with her own poorly controlled bipolar  disorder; patient  discusses the negative impact this has had on their marriage/relationship. Patient states that her wife moved out of their home to her mother's home in [place] about 1 month prior to presentation to receive the support she needs for both psychiatric struggles as well as her own grieving from loss of son. Patient identifies wife moving out as the trigger for her worsening mental health. At that time (1 month ago), patient endorses onset of anger, depressed mood, decreased motivation, and social isolation; per patient's report, these symptoms occur intermittently but have worsened over the last month. She also describes feelings of loneliness and abandonment but denies hopelessness, worthlessness, and guilt. Patient further denies having depressed mood on most days for the majority of the day; she describes herself as overall "content" throughout the week, with recurrence of mood symptoms every weekend (2 days weekly). Patient denies anhedonia, stating that she is still enjoying work, talking/spending time with family and friends, and walking her dogs along with other activities she normally enjoys; she denies any impact of mood symptoms on her ability to socially or occupationally function, noting that all of her coworkers still "tell her she's funny" per usual. Patient further denies any changes in sleep and no appetite changes that she attributes to mood; she notes decreased appetite with almost 40 lbs intentional weight loss on Mounjaro for diabetes and weight management.   Patient also endorses significant anxiety related symptoms, which are notably more frequent per interview. Patient complains of ongoing and persistent excessive worrying about multiple that is most often difficulty to control, which she states "has been going on my whole life" referring to adolescence. Patient further endorses chronic restlessness, feeling on edge, nervousness, and feeling of impending doom; patient reports worrying more  about bad things happening to others rather then herself but also shares several statements indicating concern for her own wellbeing such as "being bit by a snake when hiking" or experiencing serious injury or death when kayaking. Patient states these thoughts sometimes do prevent her from engaging in certain activities. Patient denies any history of panic attacks.   At time of encounter, patient denies current SI, HI, or AVH.    Psych ROS:  Depression: depressed mood, decreased motivation, social isolation/withdraw, previous SI with suicide attempt Duration: > 2 weeks  Frequency: 2 days per week  Anxiety: prolonged/excessive worrying, worrying that is difficult to control, nervousness, restlessness, irritability, difficulty concentrating, sense of impending doom   Duration: since adolescence (> 30 years) Frequency: multiple times daily on most days of the week   Mania (lifetime and current): denies any symptoms of mania Psychosis (lifetime and current): denies any symptoms of psychosis  OCD: denies any symptoms of OCD  PTSD: denies any symptoms of PTSD       Psychiatric and Social History    Psychiatric History:  Information collected from patient and available chart review  Previous psychiatric diagnoses: unspecified anxiety and depression, ADHD Current med management provider: Kerin Salen MSPA, PA-C with [facility] (during past 2 years)  Current therapist: Hal Neer [facility], LCAS with [facility] (once every 2 weeks)  Home Meds (current):  Duloxetine 60 mg daily for depression and anxiety: patient reports benefit with previously improved motivation and prevention of social isolation, prior to current situation   Rexulti 1 mg daily for mood augmentation: patient reports benefit with previously improved motivation and prevention of social isolation, prior to current situation  Trazodone 100 mg nightly for sleep:  patient  reports improved sleep   Adderall 20 mg IR BID: patient reports improved focus, productivity, mental processing/understanding, and word finding; reports using once to twice daily Seroquel 50-100 mg nightly PRN for sleep and Klonopin 1 mg daily listed within home meds; patient fails to disclose current or previous use of these agents.   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  Legal Hx: no known history  Living Situation: lives alone in house; previously  Marital: married to wife for 4 years; previously married and divorced to ex-husband greater than 20 years ago  Children: 1 daughter (70 years old, Designer, multimedia), 66 son (14 years old, Jake); 1 stepson (wife's son, 52 years old, deceased)  Religion/Spiritual: spiritual  Legal: denies history  Hotel manager: denies history  Access to weapons: denies access to firearms/guns or other weapons within her home  Substance History: Alcohol: previous alcohol abuse, quit about 20 years ago  A fifth of vodka daily  Hx withdrawal tremors/shakes: does not know Hx alcohol related blackouts: denies Hx alcohol induced hallucinations: denies Hx alcoholic seizures: denies Hx delirium tremens (DTs): denies DUI: denies  --------  Tobacco: current and previous use Cigarettes: quit 10 years ago  Vape: uses 1 vape over a 2 week period with amount/frequency use varying daily based on  mood and stressors  Cannabis (marijuana): previously used once weekly, quit about 20 years ago  Cocaine: tried ~5 times in high school (was not pleasurable experience; no use since) Methamphetamines: tried 1x in high school (was not pleasurable experience; no use since) Psilocybin (mushrooms): never tried Ecstasy (MDMA / molly): never tried Opiates (fentanyl / heroin): never tried Benzos (Xanax, Klonopin): Daily Xanax use x 10 years, quit 20 years ago  IV drug use: denies Prescribed meds abuse: denies  History of hospitalization: Previous alcohol and xanax abuse required one hospitalization about 20 years ago with ~30 day admission.  History of detox: unsure; unable to locate documentation from reported admission  History of rehab: 6 week IOP program at [facility] with good results; patient has remained sober from alcohol and xanax since hospitalization followed with rehab with the exception of alcohol relapse on night of overdose.     Other History   These have been pulled in through the EMR, reviewed, and updated if appropriate.   Family History:  The patient's family history includes Cancer in her mother.  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   Procedure: RIGHT TOTAL KNEE ARTHROPLASTY;  Surgeon: Valeria Batman, MD;  Location: [facility];  Service: Orthopedics;  Laterality: Right;   TOTAL KNEE ARTHROPLASTY Left 07/04/2020  Procedure: LEFT TOTAL KNEE ARTHROPLASTY;  Surgeon: Valeria Batman, MD;  Location: [facility];  Service: Orthopedics;  Laterality: Left;    Medications:   Current Facility-Administered Medications:    0.9 %  sodium chloride infusion, , Intravenous, Continuous, Leatha Gilding, MD, Last Rate: 125 mL/hr at 03/29/23 1329, Infusion Verify at 03/29/23 1329   brexpiprazole (REXULTI) tablet 1 mg, 1 mg, Oral, Daily, Segars, Christiane Ha, MD, 1 mg at 03/29/23 1328   [START ON 03/30/2023] DULoxetine (CYMBALTA) DR capsule 120 mg, 120 mg, Oral, Daily, Tagen Milby L, DO   LORazepam (ATIVAN) tablet 1-4 mg, 1-4 mg, Oral, Q4H PRN **OR** LORazepam (ATIVAN) injection 1-4 mg, 1-4 mg, Intramuscular, Q4H PRN, Augusto Gamble, MD   Clinical institute withdrawal assessment, , , Q4H **AND** thiamine (VITAMIN B1) tablet 100 mg, 100 mg, Oral, Daily, 100 mg at 03/29/23 1323 **AND** multivitamin with minerals tablet 1 tablet, 1 tablet, Oral, Daily, Augusto Gamble, MD, 1 tablet at 03/28/23 1248   sodium chloride flush (NS) 0.9 % injection 3 mL, 3 mL, Intravenous, Q12H, Segars, Christiane Ha, MD, 3 mL at 03/29/23 1328   traZODone (DESYREL) tablet 100 mg, 100 mg, Oral, QHS PRN, Augusto Gamble, MD  Allergies: Allergies  Allergen Reactions   Other Cough, Itching and Other (See Comments)    Congestion,cough,itchy eyes   Sulfamethoxazole-Trimethoprim Hives and Other (See Comments)    Burning sensation and redness   Penicillins Itching     Exam Findings   Psychiatric Specialty Exam:  Presentation  General Appearance: Appropriate for Environment  Eye Contact:Fair  Speech:Normal Rate  Speech Volume:Normal  Handedness:-- (not assessed)   Mood and Affect  Mood:Depressed  Affect:Depressed   Thought Process  Thought Processes:Linear  Descriptions of Associations:Intact  Orientation:Full (Time, Place and Person)  Thought Content:Logical  Hallucinations:Hallucinations: None  Ideas of Reference:None  Suicidal Thoughts:Suicidal Thoughts: No  Homicidal Thoughts:Homicidal Thoughts: No   Sensorium  Memory:Immediate Good; Recent Good  Judgment:Poor  Insight:Poor   Executive Functions  Concentration:Good  Attention Span:Good  Recall:Good  Fund of  Knowledge:Fair  Language:Good   Psychomotor Activity  Psychomotor Activity:Psychomotor Activity: Normal   Assets  Assets:Communication Skills; Desire for Improvement; Physical Health; Social Support; Vocational/Educational; Housing   Sleep  Sleep:Sleep: Good   Vital signs:  Temp:  [97.5 F (36.4 C)-98.2 F (36.8 C)] 97.7 F (36.5 C) (09/14 0824) Pulse Rate:  [75-89] 75 (09/14 0824) Resp:  [18] 18 (09/14 0824) BP: (131-151)/(68-85) 149/85 (09/14 0824) SpO2:  [95 %-97 %] 97 % (09/14 0824) Blood pressure (!) 149/85, pulse 75, temperature 97.7 F (36.5 C), temperature source Oral, resp. rate 18, height 5\' 8"  (1.727 m), weight 103.9 kg, SpO2 97%. Body mass index is 34.82 kg/m.   Physical Exam: Physical Exam Constitutional:      General: She is not in acute distress.    Appearance: Normal appearance. She is not ill-appearing, toxic-appearing or diaphoretic.  HENT:     Head: Normocephalic and atraumatic.  Eyes:     Extraocular Movements: Extraocular movements intact.  Pulmonary:     Effort: Pulmonary effort is normal.  Musculoskeletal:        General: Normal range of motion.     Cervical back: Normal range of motion.  Neurological:     General: No focal deficit present.     Mental Status: She is alert. Mental status is at baseline.       Assessment & Plan Summary   Rule out history of major depressive disorder/current depressive episode vs adjustment disorder with  mixed anxiety and depressed mood  Generalized anxiety disorder Restart home psychotropics as appropriate:  Increase Cymbalta 120 mg daily for anxiety and depression  Continue Rexulti 1 mg daily for mood augmentation  Continue Trazodone 100 mg nightly for sleep  Hold Adderall 20 mg IR BID at this time  Inpatient psychiatric admission pending medical clearance  Will defer further medication management to inpatient psychiatric team unless otherwise indicated  Plan to continue psychiatric assessment,  important to note concern for possible minimization which would change patient's diagnostic assessment and possibly plan    History of polysubstance abuse Initiate precautions for alcohol withdrawal given significant history of alcohol abuse in the past with relapse on day of overdose CIWA protocol in place with Ativan  Start thiamine 100 mg daily for cerebellar/cerebral protection  Start multivitamin tablet daily to ensure nutritional requirements met  Recommend further assessment, management, and support during IP psych admission   9/14/20242:07 PM

## 2023-03-30 DIAGNOSIS — F4323 Adjustment disorder with mixed anxiety and depressed mood: Secondary | ICD-10-CM | POA: Diagnosis not present

## 2023-03-30 LAB — BASIC METABOLIC PANEL
Anion gap: 12 (ref 5–15)
BUN: 19 mg/dL (ref 6–20)
CO2: 22 mmol/L (ref 22–32)
Calcium: 8.6 mg/dL — ABNORMAL LOW (ref 8.9–10.3)
Chloride: 108 mmol/L (ref 98–111)
Creatinine, Ser: 3.02 mg/dL — ABNORMAL HIGH (ref 0.44–1.00)
GFR, Estimated: 18 mL/min — ABNORMAL LOW (ref >60)
Glucose, Bld: 89 mg/dL (ref 70–99)
Potassium: 4.4 mmol/L (ref 3.5–5.1)
Sodium: 142 mmol/L (ref 135–145)

## 2023-03-30 LAB — MAGNESIUM: Magnesium: 2.1 mg/dL (ref 1.7–2.4)

## 2023-03-30 NOTE — Consult Note (Signed)
[name] New Face-to-Face Psychiatric Consult Evaluation   [name] Consult Evaluation  Service Date: March 30, 2023 LOS:  LOS: 2 days    Primary Psychiatric Diagnoses  Rule out history of major depressive disorder/current depressive episode vs adjustment disorder with mixed anxiety and depressed mood  2.  Generalized anxiety disorder 3.  History of polysubstance abuse  Assessment  LISSETH [name] is a 57 y.o. female admitted medically for 03/27/2023  9:43 PM for medical and psychiatric management following intentional overdose. She carries the psychiatric diagnoses of anxiety, depression, and ADHD and has a past medical history of  HTN, diabetes, and arthritis. Psychiatry was consulted for "intentional overdose, major depressive episode" by Dr. Lazarus Salines.  Current outpatient psychotropic medications include duloxetine 60 mg daily, rexulti 1 mg daily, adderall 20 mg daily, and trazodone 100 mg nightly and historically she has had a therapeutic response to these medications as described below. She was compliant with medications prior to admission as evidenced by patient report.   9/13: On initial examination, patient was cooperative and interacted with interviewer as appropriate. Mood was depressed with congruent affect and tearfulness; affect not entirely flat but range appeared to be somewhat restricted by mood. Patient demonstrated fair judgement and insight throughout most of the encounter. There was no evidence of psychosis or major neurological deficits.    03/29/2023 Patient seen laying in bed on my approach this afternoon. She reports that she continues to feel depressed and she has not seen her wife since being hospitalized. She has been eating and sleeping well. She mentions that she takes 120 mg of Cymbalta at home and she would like to go back to her home dose. She denies any current SI/HI/AVH.  03/30/2023 Patient seen laying in bed on my approach this  morning. She reports that she is feeling better this morning. She still hasn't seen her wife and she is concerned that they are going to separate. She understands that this is the worst case scenario and she feels like she'll be able to deal with it. She received the increased dose of Cymbalta and she denies any issues or side effects. The patient denies any SI/HI/AVH and would like to be discharged home if possible.   Per chart review, patient presents with history of unspecified depressive disorder. Given patient's home regimen including Cymbalta 60 mg daily and Rexulti 1 mg daily as prescribed by patient's medication management provider at current, it is reasonable to have high suspicion that patient previously met criteria for major depressive disorder. However, given limited access to documented psychiatric history and incomplete patient report, there is not enough evidence to unquestionably state that patient has previously as a matter of fact met criteria for MDD. Based on current presentation and subjective report, patient does not meet full blown criteria for MDD at this time; however there is also some suspicion for minimization of symptoms. If previous MDD diagnosis is true and patient's report is accurate, patient still does not meet criteria for a depressive episode at this time but rather would be in partial remission. Again, given limited information and concern for possible minimization, strongly recommend further exploration and suggest contacting patient's psychiatric care team at [facility] as there is no access to their documentation at this time.   Chart review also demonstrates previously diagnosed unspecified anxiety disorder. Again, cannot speak to whether patient previously met criteria for a more specified anxiety diagnosis. Regardless, at this time does meet criteria for  generalized anxiety disorder based on initial encounter: patient endorses excessive  worrying about many things on most days for greater than 6 months with difficult controlling worrying along with restlessness/on edge feeling, irritability, and difficulty concentrating; patient has experienced significant distress as a result of anxiety related symptoms.   In regards to reason for admission and psychiatric consult, patient reports that overdose was a cry for help with no intention or want to actually kill herself; patient further reports that she wanted other people to understand what she is experiencing, and this was part of her attempt to achieve that. In short, patient attributes her decision to distress that has resulted from marital conflict and death of child as documented below. Patient does endorse remorse and regret for her decision, she states that she is happy to be alive, and she does demonstrate insight that while her intent was not suicidal, the decision was an unhealthy maladaptive coping mechanism and does necessitate inpatient psychiatric admission as all forms of suicide attempt are treated with grave seriousness. Patient remains agreeable to and understanding of the need for inpatient psychiatric admission at this time; IVC will be pursued if patient were to no longer consent. At this time, consider patient's overall risk for suicide to be moderate based on the following: previous suicide attempt is the greatest risk factor for subsequent attempts, which is highest within the first week, which classifies patients as high risk; on the other hand, patient's lack of chronic depression and anxiety at baseline (again based on today's interview which may not be fully accurate) as well as her substantial number of protective factors, which she identifies as reasons to live, would make her low risk. Based on this reasoning, again at this time patient is considered moderate risk. Psychiatry consult and inpatient teams will continue to assess for suicidality and plan for safety.    Prior to medical clearance and under care of psychiatric consult team, home medications were restarted as appropriate as follows: Cymbalta 60 mg daily for depression and anxiety, Rixulti for further mood augmentation, and Trazodone 100 mg for sleep. Adderall held at this time. Likewise, recommend continue holding of drug used for overdose (telmisartan). Defer further med management to inpatient psychiatric team at this time unless otherwise indicated.    Please see plan below for detailed recommendations.    Suicide risk assessment:  Patient has following modifiable risk factors for suicide: social isolation (lives alone x 1 month but does interact with family, friends, and coworkers outside of residence of living), which we are addressing with planned inpatient psychiatric admission for further assessment, medication management, safety planning, and psychosocial support +/- substance abuse resources.   Patient has following non-modifiable or demographic risk factors for suicide: history of suicide attempt and psychiatric hospitalization  Patient has the following protective factors against suicide: Access to outpatient mental health care, Supportive family, Supportive friends, and Pets in the home   Diagnoses:  Active Hospital problems: Principal Problem:   Adjustment disorder with mixed anxiety and depressed mood Active Problems:   Intentional overdose (HCC)   MDD (major depressive disorder), recurrent (HCC)   Acute hypoxic respiratory failure (HCC)   Hypokalemia   Alcohol use disorder, severe (HCC)   Tobacco use disorder   Generalized anxiety disorder     Relevant Aspects of Hospital Course:  Presented via EMS from home and admitted on 03/27/2023 after overdose on ARB as cry for help without actual intent of suicide. On initial presentation to ED, SpO2 was 88% on  RA at which time she was placed on 4L [place], indicating acute hypoxic respiratory failure. CXR reassuring with no  evidence of acute pathology. Oxygenation improved, and patient resumed on RA with saturation remaining WNL. Internal medicine consulted early in admission and documented suspicion for hypoventilation, OHS, and OSA in correlation to previous hypoxia; in agreement for further assessment, especially given these conditions can contribute to psychiatric presentation. Abnormal labs results of significance include ethanol 54 and potassium 3; potassium repleted. Notably, UDS, salicylates, and tylenol negative and there was no objective evidence at that time of additional co-ingestion, consistent with patient's subjective report. Activated charcoal was administered for overdose.  Poison control consulted and recommended repeat potassium level with magnesium, which were WNL. Patient maintained on cardiac monitor which demonstrated sinus rhythm, consistent with EKG though there were borderline T wave abnormalities in the anterior leads with borderline prolonged QT interval; QTc WNL. While BP initially stable, patient became hypotensive throughout initial workup and received 1L LR with BP stabilization. Internal medicine and documented that hypotension is result of the medication patient overdosed on, which is an antihypertensive; telmisartan held given it was the drug of overdose. BP monitoring was continued and psychiatry consulted. Inpatient psychiatric admission pending medical clearance; plan for clearance if BP remains stable without further clinically significant hypotensive episodes. Patient had sitter throughout admission while awaiting medical clearance. Acute kidney injury emerged during this time with increased creatine/decreased GFR on repeat CMP; nephrology consulted and recommended conservative management with IV fluids and close monitoring. Nephrology also noted that increase in potassium, though WNL, was more than expected; noted high risk of developing hyperkalemia given sudden rise in creatinine. Electrolytes  and renal function continued to be followed on routinely repeated CMP. Similar elevation in LFTs on subsequent labs; again suspect relation to overdose and will continue to follow.    Plan & Recommendations   1. Psychiatric Medication Recommendations:  Restart home psychotropics as appropriate:  Start Cymbalta 60 mg daily for anxiety and depression  Start Rexulti 1 mg daily for mood augmentation  Start Trazodone 100 mg nightly for sleep  Hold Adderall 20 mg IR BID at this time  Initiate precautions for alcohol withdrawal given significant history of alcohol abuse in the past with relapse on day of overdose CIWA protocol in place with Ativan  Start thiamine 100 mg daily for cerebellar/cerebral protection  Start multivitamin tablet daily to ensure nutritional requirements met   2. Medical Decision Making Capacity:  Patient determined to have capacity to make decisions regarding psychiatric medication management at this time   3. Further Work-up:  Atypical antipsychotic monitoring per guidelines  TSH: ordered  Lipid profile: ordered  HbA1c: ordered  EKG 03/27/23: QtC 488 Recommend TOC consult for substance abuse resources - reasonable to defer to inpatient psychiatric team    Pertinent labwork reviewed earlier this admission includes:  Potassium: 3.0 (low) -> 4.3 (WNL) Creatinine: 0.77 (WNL) -> 1.53 (elevated)  GFR: > 60 (WNL) -> 40 (low) UDS, tylenol level, and salicylate level: WNL BAL: 54 (elevated)   4. Behavioral / Environmental:  Recommend 24 hour sitting in setting of recent suicide attempt    5. Legal Status Patient voluntary admission at this time, will IVC if necessary as noted below    Disposition:  We recommend inpatient psychiatric hospitalization after medical hospitalization. Patient is under voluntary admission status at this time; please IVC if attempts to leave hospital.   Safety and Observation Level:  Based on my clinical evaluation, I estimate the  patient to be considered high risk based in context of previous suicide attempt being greatest risk factor for subsequent suicide attempt, with highest risk within the first week; on the other hand, patient would be considered low risk based on no chronic mood symptoms at baseline as well as identification of several protective factors and reasons to live. At this time, we recommend a  direct level of observation on a 24 hour basis with constant sitter. This decision is based on my review of the chart including patient's history and current presentation, interview of the patient, mental status examination, and consideration of suicide risk including evaluating suicidal ideation, plan, intent, suicidal or self-harm behaviors, risk factors, and protective factors. This judgment is based on our ability to directly address suicide risk, implement suicide prevention strategies and develop a safety plan while the patient is in the clinical setting. Please contact our team if there is a concern that risk level has changed.   Thank you for this consult request. Our recommendations are listed above.  We will continue to follow patient's hospital course  Harlin Heys, DO   History Obtained on Interview   Patient Report (HPI):  Patient assessed at bedside on our first encounter followed admission for suicide attempt by intentional overdose with ~30 tablets of telmisartan 80 mg, part of her home medication for HTN management; confirmed previous HPI with patient with note of some inconsistencies. At time of interview, patient denies ongoing SI prior to suicide attempt. Patient does endorse persistently intermittent depressed mood, anger, anxiety, and irritability for 1 month but denies any SI or pre-meditation prior to suicide attempt other than in the moments directly leading up to OD; she identifies suicide attempt as a "spur of the moment" rash/impulsive decision, acting on sudden overwhelming intrusive  thoughts that developed as a result of intensifying mood symptoms from current life stressors. Patient denies intent to die at the time of her decision, stating that it was rather a "cry for help" and an effort toward wanting others to understand what she is going through; patient reports realization that suicide attempt is not an effective or healthy method of achieving her goal at the time. Patient endorses regret for her decision to OD as well as gratitude and joy for being alive, again emphasizing that she "never truly wanted to not live." Patient states, " I took a bunch of my blood pressure medication because I just wanted it to make me sleepy and drowsy and get someone's attention, and I thought I would wake up in the hospital." Again, patient reports understanding the seriousness of her decision, which could have resulted in her death.   Patient identifies several protective factors throughout the interview that she endorses as reasons to live including her children and other family, friends, pets, job, and other responsibilities. Patient recognizes the positive role she plays in many people's lives; she expresses regret and sadness for how her decision has negatively affected the people that love and care about her. 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 Patient also describes finding support in friends and family, including throughout the time leading up to OD. Patient endorses benefit from talking with friends, family, and therapy about her current stressors and talking through her experience and feelings with them; while patient does report benefit, she ultimately however remained emotionally overwhelmed by her  current psychosocial stressors and felt that no one truly understood what she was going through, which again  contributed to her intent behind her decision to OD.   In regards to the social stressors contributing to patient's current presentation and intentional overdose, patient identifies son's recent death and marital conflict as primary stressors. Patient reports difficulty dealing with her 81 year old son's death, which occurred in 14-Nov-2022 from Fentanyl overdose. Patient further discusses her wife's recent ongoing struggle and challenges with her own poorly controlled bipolar disorder; patient discusses the negative impact this has had on their marriage/relationship. Patient states that her wife moved out of their home to her mother's home in [place] about 1 month prior to presentation to receive the support she needs for both psychiatric struggles as well as her own grieving from loss of son. Patient identifies wife moving out as the trigger for her worsening mental health. At that time (1 month ago), patient endorses onset of anger, depressed mood, decreased motivation, and social isolation; per patient's report, these symptoms occur intermittently but have worsened over the last month. She also describes feelings of loneliness and abandonment but denies hopelessness, worthlessness, and guilt. Patient further denies having depressed mood on most days for the majority of the day; she describes herself as overall "content" throughout the week, with recurrence of mood symptoms every weekend (2 days weekly). Patient denies anhedonia, stating that she is still enjoying work, talking/spending time with family and friends, and walking her dogs along with other activities she normally enjoys; she denies any impact of mood symptoms on her ability to socially or occupationally function, noting that all of her coworkers still "tell her she's funny" per usual. Patient further denies any changes in sleep and no appetite changes that she attributes to mood; she notes decreased appetite with almost 40 lbs intentional weight loss  on Mounjaro for diabetes and weight management.   Patient also endorses significant anxiety related symptoms, which are notably more frequent per interview. Patient complains of ongoing and persistent excessive worrying about multiple that is most often difficulty to control, which she states "has been going on my whole life" referring to adolescence. Patient further endorses chronic restlessness, feeling on edge, nervousness, and feeling of impending doom; patient reports worrying more about bad things happening to others rather then herself but also shares several statements indicating concern for her own wellbeing such as "being bit by a snake when hiking" or experiencing serious injury or death when kayaking. Patient states these thoughts sometimes do prevent her from engaging in certain activities. Patient denies any history of panic attacks.   At time of encounter, patient denies current SI, HI, or AVH.    Psych ROS:  Depression: depressed mood, decreased motivation, social isolation/withdraw, previous SI with suicide attempt Duration: > 2 weeks  Frequency: 2 days per week  Anxiety: prolonged/excessive worrying, worrying that is difficult to control, nervousness, restlessness, irritability, difficulty concentrating, sense of impending doom   Duration: since adolescence (> 30 years) Frequency: multiple times daily on most days of the week   Mania (lifetime and current): denies any symptoms of mania Psychosis (lifetime and current): denies any symptoms of psychosis  OCD: denies any symptoms of OCD  PTSD: denies any symptoms of PTSD       Psychiatric and Social History    Psychiatric History:  Information collected from patient and available chart review  Previous psychiatric diagnoses: unspecified anxiety and depression, ADHD Current med management provider: Kerin Salen MSPA, PA-C with  [facility] (during past 2 years)  Current therapist: Hal Neer  [facility], LCAS with [facility] (once every 2 weeks)  Home Meds (current):  Duloxetine 60 mg daily for depression and anxiety: patient reports benefit with previously improved motivation and prevention of social isolation, prior to current situation   Rexulti 1 mg daily for mood augmentation: patient reports benefit with previously improved motivation and prevention of social isolation, prior to current situation  Trazodone 100 mg nightly for sleep: patient reports improved sleep   Adderall 20 mg IR BID: patient reports improved focus, productivity, mental processing/understanding, and word finding; reports using once to twice daily Seroquel 50-100 mg nightly PRN for sleep and Klonopin 1 mg daily listed within home meds; patient fails to disclose current or previous use of these agents.   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  Legal Hx: no known history  Living Situation: lives alone in house; previously  Marital: married to wife for 4 years; previously married and divorced to ex-husband greater than 20 years ago  Children: 1 daughter (75 years old, Designer, multimedia), 35 son (41 years old, Jake); 1 stepson (wife's son, 76 years old, deceased)  Religion/Spiritual: spiritual  Legal: denies history  Hotel manager: denies history  Access to weapons: denies access to firearms/guns or other weapons within  her home  Substance History: Alcohol: previous alcohol abuse, quit about 20 years ago  A fifth of vodka daily  Hx withdrawal tremors/shakes: does not know Hx alcohol related blackouts: denies Hx alcohol induced hallucinations: denies Hx alcoholic seizures: denies Hx delirium tremens (DTs): denies DUI: denies  --------  Tobacco: current and previous use Cigarettes: quit 10 years ago  Vape: uses 1 vape over a 2 week period with amount/frequency use varying daily based on mood and stressors  Cannabis (marijuana): previously used once weekly, quit about 20 years ago  Cocaine: tried ~5 times in high school (was not pleasurable experience; no use since) Methamphetamines: tried 1x in high school (was not pleasurable experience; no use since) Psilocybin (mushrooms): never tried Ecstasy (MDMA / molly): never tried Opiates (fentanyl / heroin): never tried Benzos (Xanax, Klonopin): Daily Xanax use x 10 years, quit 20 years ago  IV drug use: denies Prescribed meds abuse: denies  History of hospitalization: Previous alcohol and xanax abuse required one hospitalization about 20 years ago with ~30 day admission.  History of detox: unsure; unable to locate documentation from reported admission  History of rehab: 6 week IOP program at [facility] with good results; patient has remained sober from alcohol and xanax since hospitalization followed with rehab with the exception of alcohol relapse on night of overdose.     Other History   These have been pulled in through the EMR, reviewed, and updated if appropriate.   Family History:  The patient's family history includes Cancer in her mother.  Medical History: Past Medical  History:  Diagnosis Date   Anxiety    Arthritis    knees, hands   Depression    Hypertension     Surgical History: Past Surgical History:  Procedure Laterality Date   ABDOMINAL HYSTERECTOMY     KNEE ARTHROSCOPY     TOTAL KNEE  ARTHROPLASTY Right 12/14/2019   Procedure: RIGHT TOTAL KNEE ARTHROPLASTY;  Surgeon: Valeria Batman, MD;  Location: [facility];  Service: Orthopedics;  Laterality: Right;   TOTAL KNEE ARTHROPLASTY Left 07/04/2020   Procedure: LEFT TOTAL KNEE ARTHROPLASTY;  Surgeon: Valeria Batman, MD;  Location: [facility];  Service: Orthopedics;  Laterality: Left;    Medications:   Current Facility-Administered Medications:    brexpiprazole (REXULTI) tablet 1 mg, 1 mg, Oral, Daily, Segars, Jonathan, MD, 1 mg at 03/30/23 1004   DULoxetine (CYMBALTA) DR capsule 120 mg, 120 mg, Oral, Daily, Carlisle Torgeson L, DO, 120 mg at 03/30/23 1004   LORazepam (ATIVAN) tablet 1-4 mg, 1-4 mg, Oral, Q4H PRN, 1 mg at 03/29/23 1541 **OR** LORazepam (ATIVAN) injection 1-4 mg, 1-4 mg, Intramuscular, Q4H PRN, Augusto Gamble, MD   Clinical institute withdrawal assessment, , , Q4H **AND** thiamine (VITAMIN B1) tablet 100 mg, 100 mg, Oral, Daily, 100 mg at 03/30/23 1004 **AND** multivitamin with minerals tablet 1 tablet, 1 tablet, Oral, Daily, Augusto Gamble, MD, 1 tablet at 03/28/23 1248   sodium chloride flush (NS) 0.9 % injection 3 mL, 3 mL, Intravenous, Q12H, Segars, Christiane Ha, MD, 3 mL at 03/29/23 1328   traZODone (DESYREL) tablet 100 mg, 100 mg, Oral, QHS PRN, Augusto Gamble, MD  Allergies: Allergies  Allergen Reactions   Other Cough, Itching and Other (See Comments)    Congestion,cough,itchy eyes   Sulfamethoxazole-Trimethoprim Hives and Other (See Comments)    Burning sensation and redness   Penicillins Itching     Exam Findings   Psychiatric Specialty Exam:  Presentation  General Appearance: Appropriate for Environment  Eye Contact:Good  Speech:Normal Rate  Speech Volume:Normal  Handedness:-- (not assessed)   Mood and Affect  Mood:Euthymic  Affect:Constricted   Thought Process  Thought Processes:Linear  Descriptions of Associations:Intact  Orientation:Full (Time, Place and Person)  Thought  Content:Logical  Hallucinations:Hallucinations: None  Ideas of Reference:None  Suicidal Thoughts:Suicidal Thoughts: No  Homicidal Thoughts:Homicidal Thoughts: No   Sensorium  Memory:Immediate Good; Recent Good  Judgment:Good  Insight:Good   Executive Functions  Concentration:Good  Attention Span:Good  Recall:Good  Fund of Knowledge:Fair  Language:Good   Psychomotor Activity  Psychomotor Activity:No data recorded   Assets  Assets:Communication Skills; Desire for Improvement; Physical Health; Social Support; Vocational/Educational; Housing   Sleep  Sleep:Sleep: Poor   Vital signs:  Temp:  [97.8 F (36.6 C)-98.3 F (36.8 C)] 98 F (36.7 C) (09/15 0802) Pulse Rate:  [62-74] 62 (09/15 0802) Resp:  [17-20] 17 (09/15 0802) BP: (150-159)/(62-90) 156/83 (09/15 0802) SpO2:  [95 %-98 %] 97 % (09/15 0802) Blood pressure (!) 156/83, pulse 62, temperature 98 F (36.7 C), resp. rate 17, height 5\' 8"  (1.727 m), weight 103.9 kg, SpO2 97%. Body mass index is 34.82 kg/m.   Physical Exam: Physical Exam Constitutional:      General: She is not in acute distress.    Appearance: Normal appearance. She is not ill-appearing, toxic-appearing or diaphoretic.  HENT:     Head: Normocephalic and atraumatic.  Eyes:     Extraocular Movements: Extraocular movements intact.  Pulmonary:     Effort: Pulmonary effort is normal.  Musculoskeletal:        General:  Normal range of motion.     Cervical back: Normal range of motion.  Neurological:     General: No focal deficit present.     Mental Status: She is alert. Mental status is at baseline.       Assessment & Plan Summary   Rule out history of major depressive disorder/current depressive episode vs adjustment disorder with mixed anxiety and depressed mood  Generalized anxiety disorder Restart home psychotropics as appropriate:  Continue Cymbalta 120 mg daily for anxiety and depression  Continue Rexulti 1 mg daily for  mood augmentation  Continue Trazodone 100 mg nightly for sleep  Hold Adderall 20 mg IR BID at this time  Inpatient psychiatric admission pending medical clearance  Will defer further medication management to inpatient psychiatric team unless otherwise indicated  Plan to continue psychiatric assessment, important to note concern for possible minimization which would change patient's diagnostic assessment and possibly plan    History of polysubstance abuse Initiate precautions for alcohol withdrawal given significant history of alcohol abuse in the past with relapse on day of overdose CIWA protocol in place with Ativan  Start thiamine 100 mg daily for cerebellar/cerebral protection  Start multivitamin tablet daily to ensure nutritional requirements met  Recommend further assessment, management, and support during IP psych admission   9/15/202411:57 AM

## 2023-03-30 NOTE — Progress Notes (Signed)
PROGRESS NOTE  Julia [name]:[id number] DOB: 09/03/1965 DOA: 03/27/2023 PCP: Nathaneil Canary, PA-C   LOS: 2 days   Brief Narrative / Interim history: 57 year old female with history of anxiety, depression, HTN comes into the hospital with intentional overdose on telmisartan.  She has been having significant life stressors recently with loss of her son as well as marital problems, and tells me she got very angry and had more than 30 pills of telmisartan.  She told one of her sons who in turn activated EMS  Subjective / 24h Interval events: Reports that she is urinating very well, going every hour.  Also feels like her hands and legs are becoming more swollen.  Assesement and Plan: Principal Problem:   Adjustment disorder with mixed anxiety and depressed mood Active Problems:   Intentional overdose (HCC)   MDD (major depressive disorder), recurrent (HCC)   Acute hypoxic respiratory failure (HCC)   Hypokalemia   Alcohol use disorder, severe (HCC)   Tobacco use disorder   Generalized anxiety disorder   Principal problem Intentional overdose -with telmisartan, patient cannot really quantify but told me she had 2 fist fulls of pills.  Denies coingestion.  Continue to closely monitor on telemetry, but for now potassium has remained stable.  Poison control involved -Psych consulted, recommending inpatient psych once medically cleared.  Active problems Acute kidney injury -due to overdose.  Creatinine increasing rapidly but appears to have plateaued, and stabilizing today around 3.0.  She has excellent p.o. intake, monitor off IV fluids as she is fluid overloaded.  Hypokalemia-potassium stable today.  Remains at risk for hyperkalemia  Elevated LFTs -also suspect related to #1, gradually improving now  Obesity, class I-based on a BMI of 34  Major depressive disorder-Per psychiatry  Scheduled Meds:  brexpiprazole  1 mg Oral Daily   DULoxetine  120 mg Oral Daily   thiamine  100 mg  Oral Daily   And   multivitamin with minerals  1 tablet Oral Daily   sodium chloride flush  3 mL Intravenous Q12H   Continuous Infusions:   PRN Meds:.LORazepam **OR** LORazepam, traZODone  Current Outpatient Medications  Medication Instructions   albuterol (VENTOLIN HFA) 108 (90 Base) MCG/ACT inhaler 2 puffs, Inhalation, Every 6 hours PRN   amphetamine-dextroamphetamine (ADDERALL) 20 MG tablet 20 mg, Oral, 2 times daily   brexpiprazole (REXULTI) 1 mg, Oral, Daily   clonazePAM (KLONOPIN) 1 mg, Oral, See admin instructions, Take 1 tablet (1 mg) by mouth scheduled every evening, may take an additional dose during the day if needed for anxiety.   diclofenac Sodium (VOLTAREN) 2-4 g, Topical, 4 times daily PRN   DULoxetine (CYMBALTA) 120 mg, Oral, Daily   Mounjaro 10 mg, Subcutaneous, Weekly   QUEtiapine (SEROQUEL) 50-100 mg, Oral, At bedtime PRN   telmisartan (MICARDIS) 80 mg, Oral, Daily   Tiotropium Bromide-Olodaterol (STIOLTO RESPIMAT) 2.5-2.5 MCG/ACT AERS 2 puffs, Inhalation, Daily   traZODone (DESYREL) 100 mg, Daily at bedtime   Diet Orders (From admission, onward)     Start     Ordered   03/28/23 1704  Diet regular Room service appropriate? Yes; Fluid consistency: Thin  Diet effective now       Comments: Plastic supplies only-Safety tray  Question Answer Comment  Room service appropriate? Yes   Fluid consistency: Thin      03/28/23 1704            DVT prophylaxis:    Lab Results  Component Value Date   PLT 248 03/29/2023  Code Status: Full Code  Family Communication: no family at bedside   Status is: Inpatient  Level of care: Telemetry Medical  Consultants:  Psychiatry  Objective: Vitals:   03/29/23 1927 03/29/23 2339 03/30/23 0555 03/30/23 0802  BP: (!) 152/90 (!) 150/85 (!) 153/67 (!) 156/83  Pulse: 65 74 62 62  Resp: 18 18 20 17   Temp: 98.1 F (36.7 C) 98.1 F (36.7 C) 98.3 F (36.8 C) 98 F (36.7 C)  TempSrc: Oral Oral Oral   SpO2: 96%  96% 97% 97%  Weight:      Height:        Intake/Output Summary (Last 24 hours) at 03/30/2023 1102 Last data filed at 03/30/2023 0525 Gross per 24 hour  Intake 3497.36 ml  Output 950 ml  Net 2547.36 ml   Wt Readings from Last 3 Encounters:  03/27/23 103.9 kg  12/11/22 116.1 kg  09/09/22 123.4 kg    Examination:  Constitutional: NAD Eyes: lids and conjunctivae normal, no scleral icterus ENMT: mmm Neck: normal, supple Respiratory: clear to auscultation bilaterally, no wheezing, no crackles. Normal respiratory effort.  Cardiovascular: Regular rate and rhythm, no murmurs / rubs / gallops. Trace LE edema. Abdomen: soft, no distention, no tenderness. Bowel sounds positive.  Skin: no rashes  Data Reviewed: I have independently reviewed following labs and imaging studies   CBC Recent Labs  Lab 03/27/23 2225 03/29/23 0219  WBC 5.7 8.2  HGB 13.9 13.2  HCT 41.8 39.2  PLT 308 248  MCV 104.8* 106.2*  MCH 34.8* 35.8*  MCHC 33.3 33.7  RDW 14.3 14.6  LYMPHSABS 1.5  --   MONOABS 0.3  --   EOSABS 0.2  --   BASOSABS 0.1  --     Recent Labs  Lab 03/27/23 2225 03/28/23 0614 03/28/23 1456 03/28/23 1754 03/29/23 0219 03/29/23 1731 03/30/23 0224  NA 141 140  --  137 139 139 142  K 3.0* 4.3  --  4.6 4.6 4.5 4.4  CL 102 103  --  104 104 108 108  CO2 25 28  --  24 25 21* 22  GLUCOSE 117* 112*  --  101* 95 124* 89  BUN 10 9  --  13 16 20 19   CREATININE 0.77 1.53*  --  2.48* 3.14* 3.37* 3.02*  CALCIUM 9.2 8.7*  --  8.7* 8.7* 8.4* 8.6*  AST 94*  --   --  67* 53*  --   --   ALT 121*  --   --  94* 79*  --   --   ALKPHOS 63  --   --  57 57  --   --   BILITOT 0.9  --   --  0.9 0.8  --   --   ALBUMIN 4.0  --   --  3.7 3.4*  --   --   MG  --  1.9  --  2.0 2.0  --  2.1  TSH  --   --  0.922  --   --   --   --   HGBA1C  --   --  6.0*  --   --   --   --     ------------------------------------------------------------------------------------------------------------------ Recent Labs     03/29/23 0219  CHOL 150  HDL 48  LDLCALC 84  TRIG 88  CHOLHDL 3.1    Lab Results  Component Value Date   HGBA1C 6.0 (H) 03/28/2023   ------------------------------------------------------------------------------------------------------------------ Recent Labs    03/28/23 1456  TSH 0.922    Cardiac Enzymes No results for input(s): "CKMB", "TROPONINI", "MYOGLOBIN" in the last 168 hours.  Invalid input(s): "CK" ------------------------------------------------------------------------------------------------------------------ No results found for: "BNP"  CBG: No results for input(s): "GLUCAP" in the last 168 hours.  No results found for this or any previous visit (from the past 240 hour(s)).   Radiology Studies: US RENAL  Result Date: 03/29/2023 CLINICAL DATA:  Acute kidney injury EXAM: RENAL / URINARY TRACT ULTRASOUND COMPLETE COMPARISON:  None Available. FINDINGS: Right Kidney: Renal measurements: 12.5 x 5.8 x 5.9 cm = volume: 224.2 mL. Echogenicity within normal limits. No mass or hydronephrosis visualized. Left Kidney: Renal measurements: 12.3 x 6.3 x 5.9 cm = volume: 242.4 mL. Echogenicity within normal limits. No mass or hydronephrosis visualized. There is a anechoic structure seen in the left kidney measuring 11 mm. Benign-appearing cysts. Bladder: Appears normal for degree of bladder distention. Other: None. IMPRESSION: No collecting system dilatation.  Simple left-sided renal cysts. Electronically Signed   By: Karen Kays M.D.   On: 03/29/2023 13:42     Pamella Pert, MD, PhD [facility]  Between 7 am - 7 pm I am available, please contact me via Amion (for emergencies) or Securechat (non urgent messages)  Between 7 pm - 7 am I am not available, please contact night coverage MD/APP via Amion

## 2023-03-31 ENCOUNTER — Other Ambulatory Visit: Payer: Self-pay

## 2023-03-31 ENCOUNTER — Inpatient Hospital Stay
Admission: AD | Admit: 2023-03-31 | Discharge: 2023-04-03 | DRG: 885 | Disposition: A | Discharge status: Home / Self Care | Payer: 59 | Source: Intra-hospital | Attending: Psychiatry | Admitting: Psychiatry

## 2023-03-31 DIAGNOSIS — Z7985 Long-term (current) use of injectable non-insulin antidiabetic drugs: Secondary | ICD-10-CM

## 2023-03-31 DIAGNOSIS — E119 Type 2 diabetes mellitus without complications: Secondary | ICD-10-CM | POA: Diagnosis present

## 2023-03-31 DIAGNOSIS — Z87891 Personal history of nicotine dependence: Secondary | ICD-10-CM | POA: Diagnosis not present

## 2023-03-31 DIAGNOSIS — F909 Attention-deficit hyperactivity disorder, unspecified type: Secondary | ICD-10-CM | POA: Diagnosis present

## 2023-03-31 DIAGNOSIS — F3130 Bipolar disorder, current episode depressed, mild or moderate severity, unspecified: Principal | ICD-10-CM | POA: Diagnosis present

## 2023-03-31 DIAGNOSIS — F419 Anxiety disorder, unspecified: Secondary | ICD-10-CM | POA: Diagnosis present

## 2023-03-31 DIAGNOSIS — R45851 Suicidal ideations: Secondary | ICD-10-CM | POA: Diagnosis present

## 2023-03-31 DIAGNOSIS — T6592XA Toxic effect of unspecified substance, intentional self-harm, initial encounter: Secondary | ICD-10-CM | POA: Diagnosis present

## 2023-03-31 DIAGNOSIS — Z90710 Acquired absence of both cervix and uterus: Secondary | ICD-10-CM | POA: Diagnosis not present

## 2023-03-31 DIAGNOSIS — I10 Essential (primary) hypertension: Secondary | ICD-10-CM | POA: Diagnosis present

## 2023-03-31 DIAGNOSIS — Z634 Disappearance and death of family member: Secondary | ICD-10-CM | POA: Diagnosis not present

## 2023-03-31 DIAGNOSIS — F4323 Adjustment disorder with mixed anxiety and depressed mood: Secondary | ICD-10-CM | POA: Diagnosis not present

## 2023-03-31 DIAGNOSIS — Z96653 Presence of artificial knee joint, bilateral: Secondary | ICD-10-CM | POA: Diagnosis present

## 2023-03-31 DIAGNOSIS — F332 Major depressive disorder, recurrent severe without psychotic features: Principal | ICD-10-CM | POA: Diagnosis present

## 2023-03-31 DIAGNOSIS — Z79899 Other long term (current) drug therapy: Secondary | ICD-10-CM

## 2023-03-31 LAB — COMPREHENSIVE METABOLIC PANEL
ALT: 71 U/L — ABNORMAL HIGH (ref 0–44)
AST: 42 U/L — ABNORMAL HIGH (ref 15–41)
Albumin: 3.5 g/dL (ref 3.5–5.0)
Alkaline Phosphatase: 78 U/L (ref 38–126)
Anion gap: 8 (ref 5–15)
BUN: 18 mg/dL (ref 6–20)
CO2: 23 mmol/L (ref 22–32)
Calcium: 8.9 mg/dL (ref 8.9–10.3)
Chloride: 109 mmol/L (ref 98–111)
Creatinine, Ser: 2.24 mg/dL — ABNORMAL HIGH (ref 0.44–1.00)
GFR, Estimated: 25 mL/min — ABNORMAL LOW (ref >60)
Glucose, Bld: 87 mg/dL (ref 70–99)
Potassium: 3.8 mmol/L (ref 3.5–5.1)
Sodium: 140 mmol/L (ref 135–145)
Total Bilirubin: 0.6 mg/dL (ref 0.3–1.2)
Total Protein: 6.6 g/dL (ref 6.5–8.1)

## 2023-03-31 LAB — CBC
HCT: 37.6 % (ref 36.0–46.0)
Hemoglobin: 13 g/dL (ref 12.0–15.0)
MCH: 36.1 pg — ABNORMAL HIGH (ref 26.0–34.0)
MCHC: 34.6 g/dL (ref 30.0–36.0)
MCV: 104.4 fL — ABNORMAL HIGH (ref 80.0–100.0)
Platelets: 278 10*3/uL (ref 150–400)
RBC: 3.6 MIL/uL — ABNORMAL LOW (ref 3.87–5.11)
RDW: 14.4 % (ref 11.5–15.5)
WBC: 6.9 10*3/uL (ref 4.0–10.5)
nRBC: 0 % (ref 0.0–0.2)

## 2023-03-31 LAB — MAGNESIUM: Magnesium: 1.9 mg/dL (ref 1.7–2.4)

## 2023-03-31 LAB — SARS CORONAVIRUS 2 BY RT PCR: SARS Coronavirus 2 by RT PCR: NEGATIVE

## 2023-03-31 MED ORDER — DIPHENHYDRAMINE HCL 25 MG PO CAPS: 50.0000 mg | ORAL_CAPSULE | Freq: Three times a day (TID) | ORAL | Status: DC | PRN

## 2023-03-31 MED ORDER — ARFORMOTEROL TARTRATE 15 MCG/2ML IN NEBU
15.0000 ug | INHALATION_SOLUTION | Freq: Two times a day (BID) | RESPIRATORY_TRACT | Status: DC
Start: 2023-03-31 — End: 2023-04-01

## 2023-03-31 MED ORDER — MAGNESIUM HYDROXIDE 400 MG/5ML PO SUSP: 30.0000 mL | Freq: Every day | ORAL | Status: DC | PRN

## 2023-03-31 MED ORDER — AMLODIPINE BESYLATE 5 MG PO TABS
5.0000 mg | ORAL_TABLET | Freq: Every day | ORAL | Status: DC
  Administered 2023-03-31 – 2023-04-03 (×4): 5 mg via ORAL
  Filled 2023-03-31 (×4): qty 1

## 2023-03-31 MED ORDER — TRAZODONE HCL 100 MG PO TABS
100.0000 mg | ORAL_TABLET | Freq: Every evening | ORAL | Status: DC | PRN
  Filled 2023-03-31: qty 1

## 2023-03-31 MED ORDER — ALUM & MAG HYDROXIDE-SIMETH 200-200-20 MG/5ML PO SUSP
30.0000 mL | ORAL | Status: DC | PRN
  Administered 2023-04-02: 30 mL via ORAL
  Filled 2023-03-31: qty 30

## 2023-03-31 MED ORDER — LORAZEPAM 2 MG/ML IJ SOLN: 1.0000 mg | INTRAMUSCULAR | Status: AC | PRN

## 2023-03-31 MED ORDER — QUETIAPINE FUMARATE 25 MG PO TABS
50.0000 mg | ORAL_TABLET | Freq: Every evening | ORAL | Status: DC | PRN
  Administered 2023-03-31 – 2023-04-02 (×3): 100 mg via ORAL
  Filled 2023-03-31 (×3): qty 4

## 2023-03-31 MED ORDER — LORAZEPAM 1 MG PO TABS
2.0000 mg | ORAL_TABLET | Freq: Three times a day (TID) | ORAL | Status: DC | PRN
  Administered 2023-03-31: 2 mg via ORAL

## 2023-03-31 MED ORDER — HALOPERIDOL LACTATE 5 MG/ML IJ SOLN: 5.0000 mg | Freq: Three times a day (TID) | INTRAMUSCULAR | Status: DC | PRN

## 2023-03-31 MED ORDER — HALOPERIDOL 5 MG PO TABS: 5.0000 mg | ORAL_TABLET | Freq: Three times a day (TID) | ORAL | Status: DC | PRN

## 2023-03-31 MED ORDER — BREXPIPRAZOLE 1 MG PO TABS
1.0000 mg | ORAL_TABLET | Freq: Every day | ORAL | Status: DC
  Administered 2023-04-01 – 2023-04-03 (×3): 1 mg via ORAL
  Filled 2023-03-31 (×3): qty 1

## 2023-03-31 MED ORDER — IRBESARTAN 150 MG PO TABS
150.0000 mg | ORAL_TABLET | Freq: Once | ORAL | Status: DC
  Filled 2023-03-31: qty 1

## 2023-03-31 MED ORDER — LORAZEPAM 1 MG PO TABS
1.0000 mg | ORAL_TABLET | ORAL | Status: AC | PRN
  Filled 2023-03-31: qty 2

## 2023-03-31 MED ORDER — DIPHENHYDRAMINE HCL 50 MG/ML IJ SOLN: 50.0000 mg | Freq: Three times a day (TID) | INTRAMUSCULAR | Status: DC | PRN

## 2023-03-31 MED ORDER — LORAZEPAM 2 MG/ML IJ SOLN: 2.0000 mg | Freq: Three times a day (TID) | INTRAMUSCULAR | Status: DC | PRN

## 2023-03-31 MED ORDER — UMECLIDINIUM BROMIDE 62.5 MCG/ACT IN AEPB
1.0000 | INHALATION_SPRAY | Freq: Every day | RESPIRATORY_TRACT | Status: DC
Start: 2023-04-01 — End: 2023-04-01

## 2023-03-31 MED ORDER — ACETAMINOPHEN 325 MG PO TABS
650.0000 mg | ORAL_TABLET | Freq: Four times a day (QID) | ORAL | Status: DC | PRN
  Administered 2023-04-02: 650 mg via ORAL
  Filled 2023-03-31: qty 2

## 2023-03-31 MED ORDER — AMLODIPINE BESYLATE 5 MG PO TABS: 5.0000 mg | ORAL_TABLET | Freq: Every day | ORAL | 0 refills | Status: DC

## 2023-03-31 MED ORDER — IRBESARTAN 150 MG PO TABS
300.0000 mg | ORAL_TABLET | Freq: Every day | ORAL | Status: DC
  Administered 2023-04-01 – 2023-04-03 (×3): 300 mg via ORAL
  Filled 2023-03-31 (×3): qty 2

## 2023-03-31 MED ORDER — TIRZEPATIDE 10 MG/0.5ML ~~LOC~~ SOAJ: 10.0000 mg | SUBCUTANEOUS | Status: DC

## 2023-03-31 NOTE — Plan of Care (Signed)

## 2023-03-31 NOTE — Group Note (Unsigned)
Date:  04/01/2023 Time:  12:05 AM  Group Topic/Focus:  Building Self Esteem:   The Focus of this group is helping patients become aware of the effects of self-esteem on their lives, the things they and others do that enhance or undermine their self-esteem, seeing the relationship between their level of self-esteem and the choices they make and learning ways to enhance self-esteem.    Participation Level:  Did Not Attend  Participation Quality:      Affect:      Cognitive:      Insight: None  Engagement in Group:  None  Modes of Intervention:      Additional Comments:    Maeola Harman 04/01/2023, 12:05 AM

## 2023-03-31 NOTE — Progress Notes (Signed)
Attempted to call report to [facility] inpatient psych. Was informed by nurse that it would be a night shift admission and we would have to call back after 1900.

## 2023-03-31 NOTE — Progress Notes (Signed)
   03/31/23 1313  Mobility  Activity Ambulated independently in hallway  Level of Assistance Independent  Assistive Device None  Distance Ambulated (ft) 1680 ft  Activity Response Tolerated well  Mobility Referral Yes  $Mobility charge 1 Mobility  Mobility Specialist Start Time (ACUTE ONLY) 1251  Mobility Specialist Stop Time (ACUTE ONLY) 1306  Mobility Specialist Time Calculation (min) (ACUTE ONLY) 15 min   Mobility Specialist: Progress Note  Pt agreeable to mobility session - received in EOB. Ambulated independently with no AD and no complaints, asymptomatic throughout. Pt stated " I feel normal today physically."  Pt returned standing in room with all needs met. Sitter present. Pt is eager to walk.   Barnie Mort, BS Mobility Specialist Please contact via SecureChat or Rehab office at [phone].

## 2023-03-31 NOTE — Discharge Summary (Addendum)
Physician Discharge Summary  Julia [name] NGE:[id number] DOB: 1966/05/28 DOA: 03/27/2023  PCP: Nathaneil Canary, PA-C  Admit date: 03/27/2023 Discharge date: 03/31/2023  Admitted From: home Disposition:  [facility]  Recommendations for Outpatient Follow-up:  Follow up with PCP in 1-2 weeks Please obtain BMP/CBC in one week  Home Health: none Equipment/Devices: none  Discharge Condition: stable CODE STATUS: Full code Diet Orders (From admission, onward)     Start     Ordered   03/28/23 1704  Diet regular Room service appropriate? Yes; Fluid consistency: Thin  Diet effective now       Comments: Plastic supplies only-Safety tray  Question Answer Comment  Room service appropriate? Yes   Fluid consistency: Thin      03/28/23 1704            HPI: Per admitting MD, Julia [name] is a 57 y.o. female with medical history significant for major depressive disorder, HTN, HLD, and notable recent trauma with loss of her son few months ago and strained marital relationship with wife who has recently left their house last month. Presents after intentional overdose on approximately 30 tabs of Telimesartan 80 mg. Denies coingestion. States driven to OD due to her anger mostly at the strained relationship with her wife, feeling less supported, and dealing with worsening depression and the recent loss of her son to Fentanyl overdose. She denies intent on suicide but states just wanted someone else to see how bad she was hurting. No suicidal ideation or plan leading up to the overdose attempt. No prior suicide attempts. No prior psych hospitalizations. Has been in couples therapy and appears hopeful for future. Thinks her meds need to be adjusted, but not sure if truly worse depression or just dealing with her recent stressors.   Hospital Course / Discharge diagnoses: Principal Problem:   Adjustment disorder with mixed anxiety and depressed mood Active Problems:   Intentional overdose (HCC)   MDD  (major depressive disorder), recurrent (HCC)   Acute hypoxic respiratory failure (HCC)   Hypokalemia   Alcohol use disorder, severe (HCC)   Tobacco use disorder   Generalized anxiety disorder   Principal problem Intentional overdose -with telmisartan, patient cannot really quantify but told me she had > 30 pills.  Denies coingestion with additional substances.  Patient was admitted to the hospital and monitored in the telemetry unit, she did have acute kidney injury as below but now improving.  She did not have any significant potassium or magnesium imbalances or any other events on telemetry  Active problems Acute kidney injury -due to overdose.  Creatinine increased rapidly up to 3.3, but now improving off IV fluids and on oral intake alone.  Patient is urinating well, has no evidence of fluid overload or acidosis.  She has good p.o. intake.  She is medically cleared for discharge with recommendations to repeat renal function within the next 3 to 4 days and then further on as an outpatient by her PCP.  She may have a degree of chronic kidney disease but only time will tell Hypokalemia-potassium has remained stable Essential hypertension-blood pressure on the high side, she is off telmisartan because of her AKI now.  Start amlodipine and will need further up titration as indicated, as an outpatient Elevated LFTs -also suspect related to #1, gradually improving now Obesity, class I-based on a BMI of 34 Major depressive disorder-Per psychiatry  Sepsis ruled out   Discharge Instructions   Allergies as of 03/31/2023  Reactions   Other Cough, Itching, Other (See Comments)   Congestion,cough,itchy eyes   Sulfamethoxazole-trimethoprim Hives, Other (See Comments)   Burning sensation and redness   Penicillins Itching        Medication List     STOP taking these medications    amphetamine-dextroamphetamine 20 MG tablet Commonly known as: ADDERALL   telmisartan 80 MG  tablet Commonly known as: MICARDIS       TAKE these medications    albuterol 108 (90 Base) MCG/ACT inhaler Commonly known as: VENTOLIN HFA Inhale 2 puffs into the lungs every 6 (six) hours as needed for wheezing or shortness of breath.   amLODipine 5 MG tablet Commonly known as: NORVASC Take 1 tablet (5 mg total) by mouth daily.   clonazePAM 1 MG tablet Commonly known as: KLONOPIN Take 1 mg by mouth See admin instructions. Take 1 tablet (1 mg) by mouth scheduled every evening, may take an additional dose during the day if needed for anxiety.   diclofenac Sodium 1 % Gel Commonly known as: Voltaren Apply 2-4 g topically 4 (four) times daily as needed.   DULoxetine 60 MG capsule Commonly known as: CYMBALTA Take 120 mg by mouth daily.   Mounjaro 10 MG/0.5ML Pen Generic drug: tirzepatide Inject 10 mg into the skin once a week.   QUEtiapine 50 MG tablet Commonly known as: SEROQUEL Take 50-100 mg by mouth at bedtime as needed (sleep).   Rexulti 1 MG Tabs tablet Generic drug: brexpiprazole Take 1 mg by mouth daily.   Stiolto Respimat 2.5-2.5 MCG/ACT Aers Generic drug: Tiotropium Bromide-Olodaterol Inhale 2 puffs into the lungs daily.   traZODone 100 MG tablet Commonly known as: DESYREL Take 100 mg by mouth at bedtime.       Consultations: Psychiatry   Procedures/Studies:  US RENAL  Result Date: 04/25/23 CLINICAL DATA:  Acute kidney injury EXAM: RENAL / URINARY TRACT ULTRASOUND COMPLETE COMPARISON:  None Available. FINDINGS: Right Kidney: Renal measurements: 12.5 x 5.8 x 5.9 cm = volume: 224.2 mL. Echogenicity within normal limits. No mass or hydronephrosis visualized. Left Kidney: Renal measurements: 12.3 x 6.3 x 5.9 cm = volume: 242.4 mL. Echogenicity within normal limits. No mass or hydronephrosis visualized. There is a anechoic structure seen in the left kidney measuring 11 mm. Benign-appearing cysts. Bladder: Appears normal for degree of bladder distention.  Other: None. IMPRESSION: No collecting system dilatation.  Simple left-sided renal cysts. Electronically Signed   By: Karen Kays M.D.   On: 2023-04-25 13:42   DG Chest Portable 1 View  Result Date: 03/27/2023 CLINICAL DATA:  Shortness of breath, evaluate for aspiration. EXAM: PORTABLE CHEST 1 VIEW COMPARISON:  Chest x-ray 12/03/2019. FINDINGS: The heart size and mediastinal contours are within normal limits. Both lungs are clear. The visualized skeletal structures are unremarkable. IMPRESSION: No active disease. Electronically Signed   By: Darliss Cheney M.D.   On: 03/27/2023 23:27     Subjective: - no chest pain, shortness of breath, no abdominal pain, nausea or vomiting.   Discharge Exam: BP (!) 164/96 (BP Location: Left Arm)   Pulse 65   Temp 98.4 F (36.9 C) (Oral)   Resp 16   Ht 5\' 8"  (1.727 m)   Wt 103.9 kg   SpO2 94%   BMI 34.82 kg/m   General: Pt is alert, awake, not in acute distress Cardiovascular: RRR, S1/S2 +, no rubs, no gallops Respiratory: CTA bilaterally, no wheezing, no rhonchi Abdominal: Soft, NT, ND, bowel sounds + Extremities: no edema, no cyanosis  The results of significant diagnostics from this hospitalization (including imaging, microbiology, ancillary and laboratory) are listed below for reference.     Microbiology: Recent Results (from the past 240 hour(s))  SARS Coronavirus 2 by RT PCR (hospital order, performed in [facility] hospital lab) *cepheid single result test* Anterior Nasal Swab     Status: None   Collection Time: 03/31/23 11:40 AM   Specimen: Anterior Nasal Swab  Result Value Ref Range Status   SARS Coronavirus 2 by RT PCR NEGATIVE NEGATIVE Final    Comment: Performed at [facility], [address] [place], [place] [address]     Labs: Basic Metabolic Panel: Recent Labs  Lab 03/28/23 [phone] 03/28/23 1754 03/29/23 0219 03/29/23 1731 03/30/23 0224 03/31/23 0211  NA 140 137 139 139 142 140  K 4.3 4.6 4.6 4.5 4.4 3.8  CL  103 104 104 108 108 109  CO2 28 24 25  21* 22 23  GLUCOSE 112* 101* 95 124* 89 87  BUN 9 13 16 20 19 18   CREATININE 1.53* 2.48* 3.14* 3.37* 3.02* 2.24*  CALCIUM 8.7* 8.7* 8.7* 8.4* 8.6* 8.9  MG 1.9 2.0 2.0  --  2.1 1.9   Liver Function Tests: Recent Labs  Lab 03/27/23 2225 03/28/23 1754 03/29/23 0219 03/31/23 0211  AST 94* 67* 53* 42*  ALT 121* 94* 79* 71*  ALKPHOS 63 57 57 78  BILITOT 0.9 0.9 0.8 0.6  PROT 7.1 6.4* 6.0* 6.6  ALBUMIN 4.0 3.7 3.4* 3.5   CBC: Recent Labs  Lab 03/27/23 2225 03/29/23 0219 03/31/23 0211  WBC 5.7 8.2 6.9  NEUTROABS 3.7  --   --   HGB 13.9 13.2 13.0  HCT 41.8 39.2 37.6  MCV 104.8* 106.2* 104.4*  PLT 308 248 278   CBG: No results for input(s): "GLUCAP" in the last 168 hours. Hgb A1c Recent Labs    03/28/23 1456  HGBA1C 6.0*   Lipid Profile Recent Labs    03/29/23 0219  CHOL 150  HDL 48  LDLCALC 84  TRIG 88  CHOLHDL 3.1   Thyroid function studies Recent Labs    03/28/23 1456  TSH 0.922   Urinalysis    Component Value Date/Time   COLORURINE YELLOW 03/29/2023 1232   APPEARANCEUR CLEAR 03/29/2023 1232   LABSPEC <1.005 (L) 03/29/2023 1232   PHURINE 6.0 03/29/2023 1232   GLUCOSEU NEGATIVE 03/29/2023 1232   HGBUR TRACE (A) 03/29/2023 1232   BILIRUBINUR NEGATIVE 03/29/2023 1232   KETONESUR NEGATIVE 03/29/2023 1232   PROTEINUR NEGATIVE 03/29/2023 1232   NITRITE NEGATIVE 03/29/2023 1232   LEUKOCYTESUR NEGATIVE 03/29/2023 1232    FURTHER DISCHARGE INSTRUCTIONS:   Get Medicines reviewed and adjusted: Please take all your medications with you for your next visit with your Primary MD   Laboratory/radiological data: Please request your Primary MD to go over all hospital tests and procedure/radiological results at the follow up, please ask your Primary MD to get all Hospital records sent to his/her office.   In some cases, they will be blood work, cultures and biopsy results pending at the time of your discharge. Please  request that your primary care M.D. goes through all the records of your hospital data and follows up on these results.   Also Note the following: If you experience worsening of your admission symptoms, develop shortness of breath, life threatening emergency, suicidal or homicidal thoughts you must seek medical attention immediately by calling 911 or calling your MD immediately  if symptoms less severe.  You must read complete instructions/literature along with all the possible adverse reactions/side effects for all the Medicines you take and that have been prescribed to you. Take any new Medicines after you have completely understood and accpet all the possible adverse reactions/side effects.    Do not drive when taking Pain medications or sleeping medications (Benzodaizepines)   Do not take more than prescribed Pain, Sleep and Anxiety Medications. It is not advisable to combine anxiety,sleep and pain medications without talking with your primary care practitioner   Special Instructions: If you have smoked or chewed Tobacco  in the last 2 yrs please stop smoking, stop any regular Alcohol  and or any Recreational drug use.   Wear Seat belts while driving.   Please note: You were cared for by a hospitalist during your hospital stay. Once you are discharged, your primary care physician will handle any further medical issues. Please note that NO REFILLS for any discharge medications will be authorized once you are discharged, as it is imperative that you return to your primary care physician (or establish a relationship with a primary care physician if you do not have one) for your post hospital discharge needs so that they can reassess your need for medications and monitor your lab values.  Time coordinating discharge: 35 minutes  SIGNED:  Pamella Pert, MD, PhD 03/31/2023, 2:37 PM

## 2023-03-31 NOTE — TOC Transition Note (Addendum)
Transition of Care [facility]) - CM/SW Discharge Note   Patient Details  Name: Julia [name] MRN: [id number] Date of Birth: Aug 31, 1965  [facility]) CM/SW Contact:  Carley Hammed, LCSW Phone Number: 03/31/2023, 3:54 PM   Clinical Narrative:     Pt to be transported to [facility] for inpatient Psych. [facility] transport set for 7:30 Call to report [phone]  Rm# [id number]  Final next level of care: Psychiatric Hospital Barriers to Discharge: Barriers Resolved   Patient Goals and CMS Choice      Discharge Placement                Patient chooses bed at:  [facility]) Patient to be transferred to facility by: [facility] Name of family member notified: Son Patient and family notified of of transfer: 03/31/23  Discharge Plan and Services Additional resources added to the After Visit Summary for                                       Social Determinants of Health (SDOH) Interventions SDOH Screenings   Food Insecurity: No Food Insecurity (07/16/2022)   Received from [facility], [facility]  Transportation Needs: No Transportation Needs (07/16/2022)   Received from [facility], [facility]  Utilities: Not At Risk (07/16/2022)   Received from [facility], [facility]  Financial Resource Strain: Patient Declined (08/01/2022)   Received from [facility], [facility]  Physical Activity: Sufficiently Active (07/16/2022)   Received from [facility], [facility]  Social Connections: Socially Integrated (07/16/2022)   Received from [facility], [facility]  Recent Concern: Social Connections - Somewhat Isolated (04/19/2022)   Received from [facility]  Stress: No Stress Concern Present (08/01/2022)   Received from [facility], [facility]  Tobacco Use: Medium Risk (03/27/2023)     Readmission Risk Interventions     No data to display

## 2023-03-31 NOTE — Plan of Care (Signed)
  Problem: Education: Goal: Knowledge of General Education information will improve Description: Including pain rating scale, medication(s)/side effects and non-pharmacologic comfort measures 03/31/2023 1423 by Letta Moynahan, RN Outcome: Adequate for Discharge 03/31/2023 0834 by Letta Moynahan, RN Outcome: Progressing   Problem: Health Behavior/Discharge Planning: Goal: Ability to manage health-related needs will improve 03/31/2023 1423 by Letta Moynahan, RN Outcome: Adequate for Discharge 03/31/2023 0834 by Letta Moynahan, RN Outcome: Progressing   Problem: Clinical Measurements: Goal: Ability to maintain clinical measurements within normal limits will improve 03/31/2023 1423 by Letta Moynahan, RN Outcome: Adequate for Discharge 03/31/2023 0834 by Letta Moynahan, RN Outcome: Progressing Goal: Will remain free from infection 03/31/2023 1423 by Letta Moynahan, RN Outcome: Adequate for Discharge 03/31/2023 0834 by Letta Moynahan, RN Outcome: Progressing Goal: Diagnostic test results will improve 03/31/2023 1423 by Letta Moynahan, RN Outcome: Adequate for Discharge 03/31/2023 0834 by Letta Moynahan, RN Outcome: Progressing Goal: Respiratory complications will improve 03/31/2023 1423 by Letta Moynahan, RN Outcome: Adequate for Discharge 03/31/2023 0834 by Letta Moynahan, RN Outcome: Progressing Goal: Cardiovascular complication will be avoided 03/31/2023 1423 by Letta Moynahan, RN Outcome: Adequate for Discharge 03/31/2023 0834 by Letta Moynahan, RN Outcome: Progressing   Problem: Activity: Goal: Risk for activity intolerance will decrease 03/31/2023 1423 by Letta Moynahan, RN Outcome: Adequate for Discharge 03/31/2023 0834 by Letta Moynahan, RN Outcome: Progressing   Problem: Nutrition: Goal: Adequate nutrition will be maintained 03/31/2023 1423 by Letta Moynahan, RN Outcome: Adequate for Discharge 03/31/2023 0834 by Letta Moynahan, RN Outcome: Progressing   Problem:  Coping: Goal: Level of anxiety will decrease 03/31/2023 1423 by Letta Moynahan, RN Outcome: Adequate for Discharge 03/31/2023 0834 by Letta Moynahan, RN Outcome: Progressing   Problem: Elimination: Goal: Will not experience complications related to bowel motility 03/31/2023 1423 by Letta Moynahan, RN Outcome: Adequate for Discharge 03/31/2023 0834 by Letta Moynahan, RN Outcome: Progressing Goal: Will not experience complications related to urinary retention 03/31/2023 1423 by Letta Moynahan, RN Outcome: Adequate for Discharge 03/31/2023 0834 by Letta Moynahan, RN Outcome: Progressing   Problem: Pain Managment: Goal: General experience of comfort will improve 03/31/2023 1423 by Letta Moynahan, RN Outcome: Adequate for Discharge 03/31/2023 0834 by Letta Moynahan, RN Outcome: Progressing   Problem: Safety: Goal: Ability to remain free from injury will improve 03/31/2023 1423 by Letta Moynahan, RN Outcome: Adequate for Discharge 03/31/2023 0834 by Letta Moynahan, RN Outcome: Progressing   Problem: Skin Integrity: Goal: Risk for impaired skin integrity will decrease 03/31/2023 1423 by Letta Moynahan, RN Outcome: Adequate for Discharge 03/31/2023 0834 by Letta Moynahan, RN Outcome: Progressing

## 2023-03-31 NOTE — Consult Note (Signed)
[name] New Face-to-Face Psychiatric Consult Evaluation   [name] Consult Evaluation  Service Date: March 31, 2023 LOS:  LOS: 3 days    Primary Psychiatric Diagnoses  Rule out history of major depressive disorder/current depressive episode vs adjustment disorder with mixed anxiety and depressed mood  2.  Generalized anxiety disorder 3.  History of polysubstance abuse  Assessment  Jul[name] is a 57 y.o. female admitted medically for 03/27/2023  9:43 PM for medical and psychiatric management following intentional overdose. She carries the psychiatric diagnoses of anxiety, depression, and ADHD and has a past medical history of  HTN, diabetes, and arthritis. Psychiatry was consulted for "intentional overdose, major depressive episode" by Dr. Lazarus Salines.  Current outpatient psychotropic medications include duloxetine 60 mg daily, rexulti 1 mg daily, adderall 20 mg daily, and trazodone 100 mg nightly and historically she has had a therapeutic response to these medications as described below. She was compliant with medications prior to admission as evidenced by patient report.   Patient seen and assessed by this psychiatric nurse practitioner. Case reviewed and chart discussed with primary team and patient. Patient with no previous past psychiatric history who presents to the emergency department after suicide attempt by overdose on medications. She endorses ongoing suffering and pain that lead to her attempt. Her main triggers are the loss of her son, and martial discord. She wanted someone to feel her pain with her. She endorses " wreckless and impulsive attempt" and identifies this as a cry for help. " I dint understand the ramifications and or punishment associated with a suicide attempt." She remains remorseful for her actions, and shows improved insight at this time.  She currently denies suicidal ideation, suicidal thoughts, and or self-harm.  She is able to  contract for safety while on the unit, and remains under one-to-one observation at this time due to her suicide attempt of high lethality. She denies any side effects or adverse reactions at this time. She is eating and sleeping well.   Patient is able to vocalize her grief is a major contributing factor to her worsening suicidal ideations and worsening depressive symptoms at this time.  While patient grief has contributed to her worsening mood and increase in suicidal ideations, she continues to benefit from inpatient psychiatric hospitalization for crisis stabilization, coping skills and therapy, and effective medication management.    Please see plan below for detailed recommendations.    Suicide risk assessment:  Patient has following modifiable risk factors for suicide: social isolation (lives alone x 1 month but does interact with family, friends, and coworkers outside of residence of living), which we are addressing with planned inpatient psychiatric admission for further assessment, medication management, safety planning, and psychosocial support +/- substance abuse resources.   Patient has following non-modifiable or demographic risk factors for suicide: history of suicide attempt and psychiatric hospitalization  Patient has the following protective factors against suicide: Access to outpatient mental health care, Supportive family, Supportive friends, and Pets in the home   Diagnoses:  Active Hospital problems: Principal Problem:   Adjustment disorder with mixed anxiety and depressed mood Active Problems:   Intentional overdose (HCC)   MDD (major depressive disorder), recurrent (HCC)   Acute hypoxic respiratory failure (HCC)   Hypokalemia   Alcohol use disorder, severe (HCC)   Tobacco use disorder   Generalized anxiety disorder     Relevant Aspects of Hospital Course:  Presented via EMS from home and admitted on 03/27/2023 after overdose on  ARB as cry for help without actual  intent of suicide. On initial presentation to ED, SpO2 was 88% on RA at which time she was placed on 4L [place], indicating acute hypoxic respiratory failure. CXR reassuring with no evidence of acute pathology. Oxygenation improved, and patient resumed on RA with saturation remaining WNL. Internal medicine consulted early in admission and documented suspicion for hypoventilation, OHS, and OSA in correlation to previous hypoxia; in agreement for further assessment, especially given these conditions can contribute to psychiatric presentation. Abnormal labs results of significance include ethanol 54 and potassium 3; potassium repleted. Notably, UDS, salicylates, and tylenol negative and there was no objective evidence at that time of additional co-ingestion, consistent with patient's subjective report. Activated charcoal was administered for overdose.  Poison control consulted and recommended repeat potassium level with magnesium, which were WNL. Patient maintained on cardiac monitor which demonstrated sinus rhythm, consistent with EKG though there were borderline T wave abnormalities in the anterior leads with borderline prolonged QT interval; QTc WNL.    Plan & Recommendations   1. Psychiatric Medication Recommendations:  Restart home psychotropics as appropriate:  Continue Cymbalta 60 mg daily for anxiety and depression  Continue Rexulti 1 mg daily for mood augmentation  Continue Trazodone 100 mg nightly for sleep  Hold Adderall 20 mg IR BID at this time  Initiate precautions for alcohol withdrawal given significant history of alcohol abuse in the past with relapse on day of overdose CIWA protocol in place with Ativan  Start thiamine 100 mg daily for cerebellar/cerebral protection  Start multivitamin tablet daily to ensure nutritional requirements met   2. Medical Decision Making Capacity:  Patient determined to have capacity to make decisions regarding psychiatric medication management at this  time   3. Further Work-up:  Atypical antipsychotic monitoring per guidelines  TSH: ordered  Lipid profile: ordered  HbA1c: ordered  EKG 03/27/23: QtC 488 Recommend TOC consult for substance abuse resources - reasonable to defer to inpatient psychiatric team    Pertinent labwork reviewed earlier this admission includes:  Potassium: 3.0 (low) -> 4.3 (WNL) Creatinine: 0.77 (WNL) -> 1.53 (elevated)  GFR: > 60 (WNL) -> 40 (low) UDS, tylenol level, and salicylate level: WNL BAL: 54 (elevated)   4. Behavioral / Environmental:  Recommend 24 hour sitting in setting of recent suicide attempt    5. Legal Status Patient voluntary admission at this time, will IVC if necessary as noted below    Disposition:  We recommend inpatient psychiatric hospitalization after medical hospitalization. Patient is under voluntary admission status at this time; please IVC if attempts to leave hospital.   Safety and Observation Level:  Based on my clinical evaluation, I estimate the patient to be considered high risk based in context of previous suicide attempt being greatest risk factor for subsequent suicide attempt, with highest risk within the first week; on the other hand, patient would be considered low risk based on no chronic mood symptoms at baseline as well as identification of several protective factors and reasons to live. At this time, we recommend a  direct level of observation on a 24 hour basis with constant sitter. This decision is based on my review of the chart including patient's history and current presentation, interview of the patient, mental status examination, and consideration of suicide risk including evaluating suicidal ideation, plan, intent, suicidal or self-harm behaviors, risk factors, and protective factors. This judgment is based on our ability to directly address suicide risk, implement suicide prevention strategies and develop a safety  plan while the patient is in the clinical  setting. Please contact our team if there is a concern that risk level has changed.   Thank you for this consult request. Our recommendations are listed above.  We will continue to follow patient's hospital course. Patient has been accepted to [facility], new orders to follow.   Maryagnes Amos, FNP   History Obtained on Interview   Patient Report (HPI):  Patient assessed at bedside on our first encounter followed admission for suicide attempt by intentional overdose with ~30 tablets of telmisartan 80 mg, part of her home medication for HTN management; confirmed previous HPI with patient with note of some inconsistencies. At time of interview, patient denies ongoing SI prior to suicide attempt. Patient does endorse persistently intermittent depressed mood, anger, anxiety, and irritability for 1 month but denies any SI or pre-meditation prior to suicide attempt other than in the moments directly leading up to OD; she identifies suicide attempt as a "spur of the moment" rash/impulsive decision, acting on sudden overwhelming intrusive thoughts that developed as a result of intensifying mood symptoms from current life stressors. Patient denies intent to die at the time of her decision, stating that it was rather a "cry for help" and an effort toward wanting others to understand what she is going through; patient reports realization that suicide attempt is not an effective or healthy method of achieving her goal at the time. Patient endorses regret for her decision to OD as well as gratitude and joy for being alive, again emphasizing that she "never truly wanted to not live." Patient states, " I took a bunch of my blood pressure medication because I just wanted it to make me sleepy and drowsy and get someone's attention, and I thought I would wake up in the hospital." Again, patient reports understanding the seriousness of her decision, which could have resulted in her death.   Patient identifies several  protective factors throughout the interview that she endorses as reasons to live including her children and other family, friends, pets, job, and other responsibilities. Patient recognizes the positive role she plays in many people's lives; she expresses regret and sadness for how her decision has negatively affected the people that love and care about her. 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 Patient also describes finding support in friends and family, including throughout the time leading up to OD. Patient endorses benefit from talking with friends, family, and therapy about her current stressors and talking through her experience and feelings with them; while patient does report benefit, she ultimately however remained emotionally overwhelmed by her current psychosocial stressors and felt that no one truly understood what she was going through, which again contributed to her intent behind her decision to OD.   In regards to the social stressors contributing to patient's current presentation and intentional overdose, patient identifies son's recent death and marital conflict as primary stressors. Patient reports difficulty dealing with her 11 year old son's death, which occurred in 11/27/22 from Fentanyl overdose. Patient further discusses her wife's recent ongoing struggle and challenges with her own poorly controlled bipolar disorder; patient discusses the negative impact this has had on their marriage/relationship. Patient states that her wife moved out of their home to her mother's home in [place] about 1 month prior to presentation to receive the support she needs for  both psychiatric struggles as well as her own grieving from loss of son. Patient identifies wife moving out as the trigger for her worsening mental health. At  that time (1 month ago), patient endorses onset of anger, depressed mood, decreased motivation, and social isolation; per patient's report, these symptoms occur intermittently but have worsened over the last month. She also describes feelings of loneliness and abandonment but denies hopelessness, worthlessness, and guilt. Patient further denies having depressed mood on most days for the majority of the day; she describes herself as overall "content" throughout the week, with recurrence of mood symptoms every weekend (2 days weekly). Patient denies anhedonia, stating that she is still enjoying work, talking/spending time with family and friends, and walking her dogs along with other activities she normally enjoys; she denies any impact of mood symptoms on her ability to socially or occupationally function, noting that all of her coworkers still "tell her she's funny" per usual. Patient further denies any changes in sleep and no appetite changes that she attributes to mood; she notes decreased appetite with almost 40 lbs intentional weight loss on Mounjaro for diabetes and weight management.   Patient also endorses significant anxiety related symptoms, which are notably more frequent per interview. Patient complains of ongoing and persistent excessive worrying about multiple that is most often difficulty to control, which she states "has been going on my whole life" referring to adolescence. Patient further endorses chronic restlessness, feeling on edge, nervousness, and feeling of impending doom; patient reports worrying more about bad things happening to others rather then herself but also shares several statements indicating concern for her own wellbeing such as "being bit by a snake when hiking" or experiencing serious injury or death when kayaking. Patient states these thoughts sometimes do prevent her from engaging in certain activities. Patient denies any history of panic attacks.   At time of encounter,  patient denies current SI, HI, or AVH.   Patient is calm and cooperative, exhibiting no signs of acute distress this morning. She reports feeling "normal" and significantly better compared to yesterday. The patient acknowledges suicidal attempt with intent to die was a cry for help. She expresses a strong interest in inpatient psych admission. The patient adamantly denies experiencing suicidal ideation, homicidal ideation, or auditory/visual hallucinations. She demonstrates a high level of insight and remorse into the negative consequences of her suicide attempt.   Psych ROS:  Depression: depressed mood, decreased motivation, social isolation/withdraw, previous SI with suicide attempt Duration: > 2 weeks  Frequency: 2 days per week  Anxiety: prolonged/excessive worrying, worrying that is difficult to control, nervousness, restlessness, irritability, difficulty concentrating, sense of impending doom   Duration: since adolescence (> 30 years) Frequency: multiple times daily on most days of the week   Mania (lifetime and current): denies any symptoms of mania Psychosis (lifetime and current): denies any symptoms of psychosis  OCD: denies any symptoms of OCD  PTSD: denies any symptoms of PTSD       Psychiatric and Social History    Psychiatric History:  Information collected from patient and available chart review  Previous psychiatric diagnoses: unspecified anxiety and depression, ADHD Current med management provider: Kerin Salen MSPA, PA-C with [facility] (during past 2 years)  Current therapist: Hal Neer [facility], LCAS with [facility] (once every 2 weeks)  Home Meds (current):  Duloxetine 60 mg daily for depression and anxiety: patient reports benefit with previously improved motivation and prevention of social isolation, prior  to current situation   Rexulti 1 mg daily for mood augmentation: patient reports benefit with  previously improved motivation and prevention of social isolation, prior to current situation  Trazodone 100 mg nightly for sleep: patient reports improved sleep   Adderall 20 mg IR BID: patient reports improved focus, productivity, mental processing/understanding, and word finding; reports using once to twice daily Seroquel 50-100 mg nightly PRN for sleep and Klonopin 1 mg daily listed within home meds; patient fails to disclose current or previous use of these agents.   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  Legal Hx: no known history  Living Situation: lives alone in house; previously  Marital: married to wife for 4 years; previously married and divorced to ex-husband greater than 20 years ago  Children: 1 daughter (45 years old, Designer, multimedia), 46 son (48 years old, Jake); 1 stepson (wife's son, 62 years old, deceased)  Religion/Spiritual: spiritual  Legal: denies history  Hotel manager: denies history  Access to weapons: denies access to firearms/guns or other weapons within her home  Substance History: Alcohol: previous alcohol abuse, quit about 20 years ago  A fifth of vodka daily  Hx withdrawal tremors/shakes: does not know Hx alcohol related blackouts: denies Hx alcohol induced hallucinations: denies Hx alcoholic seizures: denies Hx delirium tremens (DTs): denies DUI: denies  --------  Tobacco:  current and previous use Cigarettes: quit 10 years ago  Vape: uses 1 vape over a 2 week period with amount/frequency use varying daily based on mood and stressors  Cannabis (marijuana): previously used once weekly, quit about 20 years ago  Cocaine: tried ~5 times in high school (was not pleasurable experience; no use since) Methamphetamines: tried 1x in high school (was not pleasurable experience; no use since) Psilocybin (mushrooms): never tried Ecstasy (MDMA / molly): never tried Opiates (fentanyl / heroin): never tried Benzos (Xanax, Klonopin): Daily Xanax use x 10 years, quit 20 years ago  IV drug use: denies Prescribed meds abuse: denies  History of hospitalization: Previous alcohol and xanax abuse required one hospitalization about 20 years ago with ~30 day admission.  History of detox: unsure; unable to locate documentation from reported admission  History of rehab: 6 week IOP program at [facility] with good results; patient has remained sober from alcohol and xanax since hospitalization followed with rehab with the exception of alcohol relapse on night of overdose.     Other History   These have been pulled in through the EMR, reviewed, and updated if appropriate.   Family History:  The patient's family history includes Cancer in her mother.  Medical History: Past Medical History:  Diagnosis Date   Anxiety    Arthritis    knees, hands   Depression    Hypertension     Surgical History: Past Surgical History:  Procedure Laterality Date   ABDOMINAL HYSTERECTOMY     KNEE ARTHROSCOPY     TOTAL KNEE  ARTHROPLASTY Right 12/14/2019   Procedure: RIGHT TOTAL KNEE ARTHROPLASTY;  Surgeon: Valeria Batman, MD;  Location: [facility];  Service: Orthopedics;  Laterality: Right;   TOTAL KNEE ARTHROPLASTY Left 07/04/2020   Procedure: LEFT TOTAL KNEE ARTHROPLASTY;  Surgeon: Valeria Batman, MD;  Location: [facility];  Service: Orthopedics;  Laterality:  Left;    Medications:   Current Facility-Administered Medications:    brexpiprazole (REXULTI) tablet 1 mg, 1 mg, Oral, Daily, Segars, Jonathan, MD, 1 mg at 03/31/23 0825   LORazepam (ATIVAN) tablet 1-4 mg, 1-4 mg, Oral, Q4H PRN, 1 mg at 03/30/23 1832 **OR** LORazepam (ATIVAN) injection 1-4 mg, 1-4 mg, Intramuscular, Q4H PRN, Augusto Gamble, MD   Clinical institute withdrawal assessment, , , Q4H **AND** thiamine (VITAMIN B1) tablet 100 mg, 100 mg, Oral, Daily, 100 mg at 03/31/23 0825 **AND** multivitamin with minerals tablet 1 tablet, 1 tablet, Oral, Daily, Augusto Gamble, MD, 1 tablet at 03/28/23 1248   sodium chloride flush (NS) 0.9 % injection 3 mL, 3 mL, Intravenous, Q12H, Segars, Christiane Ha, MD, 3 mL at 03/31/23 0825   traZODone (DESYREL) tablet 100 mg, 100 mg, Oral, QHS PRN, Augusto Gamble, MD  Allergies: Allergies  Allergen Reactions   Other Cough, Itching and Other (See Comments)    Congestion,cough,itchy eyes   Sulfamethoxazole-Trimethoprim Hives and Other (See Comments)    Burning sensation and redness   Penicillins Itching     Exam Findings   Psychiatric Specialty Exam:  Presentation  General Appearance: Appropriate for Environment  Eye Contact:Good  Speech:Normal Rate  Speech Volume:Normal  Handedness:-- (not assessed)   Mood and Affect  Mood:Euthymic  Affect:Appropriate; Congruent   Thought Process  Thought Processes:Coherent; Goal Directed; Linear  Descriptions of Associations:Intact  Orientation:Full (Time, Place and Person)  Thought Content:WDL  Hallucinations:Hallucinations: None  Ideas of Reference:None  Suicidal Thoughts:Suicidal Thoughts: No  Homicidal Thoughts:Homicidal Thoughts: No   Sensorium  Memory:Immediate Good; Recent Good; Remote Good  Judgment:Good  Insight:Fair   Executive Functions  Concentration:Good  Attention Span:Good  Recall:Good  Fund of Knowledge:Good  Language:Good   Psychomotor Activity  Psychomotor  Activity:Psychomotor Activity: Normal    Assets  Assets:Communication Skills; Desire for Improvement; Physical Health; Social Support; Vocational/Educational   Sleep  Sleep:Sleep: Poor   Vital signs:  Temp:  [97.7 F (36.5 C)-98.4 F (36.9 C)] 98.4 F (36.9 C) (09/16 0725) Pulse Rate:  [58-70] 65 (09/16 0725) Resp:  [16-19] 16 (09/16 0725) BP: (143-164)/(79-96) 164/96 (09/16 0725) SpO2:  [94 %-98 %] 94 % (09/16 0725) Blood pressure (!) 164/96, pulse 65, temperature 98.4 F (36.9 C), temperature source Oral, resp. rate 16, height 5\' 8"  (1.727 m), weight 103.9 kg, SpO2 94%. Body mass index is 34.82 kg/m.   Physical Exam: Physical Exam Constitutional:      General: She is not in acute distress.    Appearance: Normal appearance. She is normal weight. She is not ill-appearing, toxic-appearing or diaphoretic.  HENT:     Head: Normocephalic and atraumatic.  Eyes:     Extraocular Movements: Extraocular movements intact.  Pulmonary:     Effort: Pulmonary effort is normal.  Musculoskeletal:        General: Normal range of motion.     Cervical back: Normal range of motion.  Neurological:     General: No focal deficit present.     Mental Status: She is alert and oriented to person, place, and time. Mental status is at baseline.  Psychiatric:        Attention and Perception: Attention and  perception normal.        Mood and Affect: Affect normal. Mood is anxious and depressed.        Speech: Speech normal.        Behavior: Behavior normal. Behavior is cooperative.        Thought Content: Thought content normal.        Cognition and Memory: Cognition and memory normal.        Judgment: Judgment normal.       Assessment & Plan Summary   Rule out history of major depressive disorder/current depressive episode vs adjustment disorder with mixed anxiety and depressed mood  Generalized anxiety disorder Restart home psychotropics as appropriate:  Continue Cymbalta 120 mg daily  for anxiety and depression  Continue Rexulti 1 mg daily for mood augmentation  Continue Trazodone 100 mg nightly for sleep  Hold Adderall 20 mg IR BID at this time  Inpatient psychiatric admission pending medical clearance  Will defer further medication management to inpatient psychiatric team unless otherwise indicated  Plan to continue psychiatric assessment, important to note concern for possible minimization which would change patient's diagnostic assessment and possibly plan    History of polysubstance abuse Initiate precautions for alcohol withdrawal given significant history of alcohol abuse in the past with relapse on day of overdose CIWA protocol in place with Ativan  Start thiamine 100 mg daily for cerebellar/cerebral protection  Start multivitamin tablet daily to ensure nutritional requirements met  Recommend further assessment, management, and support during IP psych admission   9/16/202412:01 PM

## 2023-04-01 DIAGNOSIS — F3130 Bipolar disorder, current episode depressed, mild or moderate severity, unspecified: Secondary | ICD-10-CM | POA: Diagnosis not present

## 2023-04-01 MED ORDER — DULOXETINE HCL 60 MG PO CPEP
60.0000 mg | ORAL_CAPSULE | Freq: Every day | ORAL | Status: DC
  Administered 2023-04-01 – 2023-04-03 (×3): 60 mg via ORAL
  Filled 2023-04-01 (×3): qty 1

## 2023-04-01 MED ORDER — ADULT MULTIVITAMIN W/MINERALS CH
1.0000 | ORAL_TABLET | Freq: Every day | ORAL | Status: DC
  Administered 2023-04-01 – 2023-04-03 (×3): 1 via ORAL
  Filled 2023-04-01 (×3): qty 1

## 2023-04-01 MED ORDER — TIOTROPIUM BROMIDE-OLODATEROL 2.5-2.5 MCG/ACT IN AERS
2.0000 | INHALATION_SPRAY | Freq: Every day | RESPIRATORY_TRACT | Status: DC
  Administered 2023-04-01 – 2023-04-03 (×3): 2 via RESPIRATORY_TRACT
  Filled 2023-04-01: qty 1

## 2023-04-01 MED ORDER — GLUCERNA SHAKE PO LIQD
237.0000 mL | Freq: Three times a day (TID) | ORAL | Status: DC
  Administered 2023-04-01 – 2023-04-02 (×5): 237 mL via ORAL

## 2023-04-01 MED ORDER — ENSURE ENLIVE PO LIQD: 237.0000 mL | Freq: Two times a day (BID) | ORAL | Status: DC

## 2023-04-01 NOTE — Progress Notes (Addendum)
D-Pt is alert and oriented . Pt reports anxiety/depression of a 10/10 at this time. Pt denies experiencing any pain at this time. Pt denies experiencing any SI/HI or AVH at this time.   A- Scheduled medications administered to pt, per MD orders. Pt refused her irbesartan.  Support and encouragement provided. Pts majoro pen is not available, family will reach out to family to bring in medication. Frequent verbal contact made. Routine safety checks conducted  q15 minutes.   R- No adverse drug reactions noted. Pt verbally contracts at this time for safety at this time. Pt complaint with medications and treatment plan. Pt remains safe at this time . Plan of care ongoing.

## 2023-04-01 NOTE — Group Note (Signed)
Recreation Therapy Group Note   Group Topic:General Recreation  Group Date: 04/01/2023 Start Time: 1400 End Time: 1455 Facilitators: Rosina Lowenstein, LRT, CTRS Location:  Day Room  Group Description: Bingo. LRT and patients played multiple games of Bingo with music playing in the background. LRT and pts discussed how this could be a leisure interest and the importance of doing things they enjoy post-discharge.   Goal Area(s) Addressed: Patient will identify leisure interests.  Patient will practice healthy decision making. Patient will engage in recreation activity.    Affect/Mood: Appropriate   Participation Level: Active and Engaged   Participation Quality: Independent   Behavior: Appropriate, Calm, and Cooperative   Speech/Thought Process: Coherent   Insight: Good   Judgement: Good   Modes of Intervention: Cooperative Play   Patient Response to Interventions:  Attentive, Engaged, Interested , and Receptive   Education Outcome:  Acknowledges education   Clinical Observations/Individualized Feedback: Molenaar was active in their participation of session activities and group discussion. Pt won multiple rounds of bingo and chose chapstick as a Scientist, research (physical sciences). Pt interacted well with LRT and peers duration of session.   Plan: Continue to engage patient in RT group sessions 2-3x/week.   Rosina Lowenstein, LRT, CTRS 04/01/2023 3:13 PM

## 2023-04-01 NOTE — Progress Notes (Signed)
   04/01/23 1100  Psych Admission Type (Psych Patients Only)  Admission Status Involuntary  Psychosocial Assessment  Patient Complaints Anxiety  Eye Contact Brief  Facial Expression Flat  Affect Depressed  Speech Logical/coherent  Interaction Minimal  Motor Activity Slow  Appearance/Hygiene Unremarkable  Behavior Characteristics Cooperative;Calm;Pacing  Mood Depressed;Anxious  Thought Process  Coherency WDL  Content WDL  Delusions None reported or observed  Perception WDL  Hallucination None reported or observed  Judgment WDL  Confusion None  Danger to Self  Current suicidal ideation? Denies  Danger to Others  Danger to Others None reported or observed   Continues to adjust to unit. Denies SI/HI/AVH but states she having depression and anxiety. Tolerating all medications and meals. Minimal interaction with peers and staff. Attended group.

## 2023-04-01 NOTE — Group Note (Signed)
[facility] LCSW Group Therapy Note    Group Date: 04/01/2023 Start Time: 1315 End Time: 1400  Type of Therapy and Topic:  Group Therapy:  Overcoming Obstacles  Participation Level:  BHH PARTICIPATION LEVEL: Active  Mood:  Description of Group:   In this group patients will be encouraged to explore what they see as obstacles to their own wellness and recovery. They will be guided to discuss their thoughts, feelings, and behaviors related to these obstacles. The group will process together ways to cope with barriers, with attention given to specific choices patients can make. Each patient will be challenged to identify changes they are motivated to make in order to overcome their obstacles. This group will be process-oriented, with patients participating in exploration of their own experiences as well as giving and receiving support and challenge from other group members.  Therapeutic Goals: 1. Patient will identify personal and current obstacles as they relate to admission. 2. Patient will identify barriers that currently interfere with their wellness or overcoming obstacles.  3. Patient will identify feelings, thought process and behaviors related to these barriers. 4. Patient will identify two changes they are willing to make to overcome these obstacles:    Summary of Patient Progress   Pt was sullen throughout group but was able to provide and accept feedback from peers   Therapeutic Modalities:   Cognitive Behavioral Therapy Solution Focused Therapy Motivational Interviewing Relapse Prevention Therapy   Elza Rafter, LCSWA

## 2023-04-01 NOTE — Plan of Care (Signed)

## 2023-04-01 NOTE — Group Note (Signed)
Date:  04/01/2023 Time:  9:52 PM  Group Topic/Focus:  Early Warning Signs:   The focus of this group is to help patients identify signs or symptoms they exhibit before slipping into an unhealthy state or crisis.    Participation Level:  Active  Participation Quality:  Appropriate  Affect:  Appropriate  Cognitive:  Alert  Insight: Good  Engagement in Group:  Engaged  Modes of Intervention:  Activity  Additional Comments:    Maeola Harman 04/01/2023, 9:52 PM

## 2023-04-01 NOTE — Progress Notes (Addendum)
   04/01/23 1930  Psych Admission Type (Psych Patients Only)  Admission Status Involuntary  Psychosocial Assessment  Patient Complaints Anxiety;Depression;Crying spells (tearful)  Eye Contact Fair  Facial Expression Anxious  Affect Depressed  Speech Logical/coherent  Interaction Assertive  Motor Activity Slow  Appearance/Hygiene Unremarkable  Behavior Characteristics Cooperative;Calm  Mood Depressed;Anxious  Thought Process  Coherency WDL  Content WDL  Delusions None reported or observed  Perception WDL  Hallucination None reported or observed  Judgment WDL  Confusion None  Danger to Self  Current suicidal ideation? Denies  Danger to Others  Danger to Others None reported or observed   Progress note   D: Pt seen in her room. Pt denies SI, HI, AVH. Pt rates pain  0/10. Pt rates anxiety  8/10 and depression  10/10. "I just want to wake up and be happy again." Pt works at [employer]. Recently, her wife went to stay with her mother and her stepson died of an overdose in 2022-11-30. Pt states that her wife said that if she hadn't left, then the pt wouldn't pay attention to her own self-care. Pt states that she has been neglecting herself. She is worried about getting access to [facility] paperwork sent from her email. Pt encouraged to reach out to social worker tomorrow for assistance in getting that paperwork accessed. Pt states that her medication has not been working for a few months and that she is open to a medication change. Pt states that her provider had increased her Duloxetine to 120 mg daily. Pt being given 60 mg dosage here. Pt encouraged to address this with provider in the morning. No other concerns noted at this time.  A: Pt provided support and encouragement. Pt given scheduled medication as prescribed. PRNs as appropriate. Q15 min checks for safety.   R: Pt safe on the unit. Will continue to monitor.

## 2023-04-01 NOTE — Progress Notes (Signed)
NUTRITION ASSESSMENT  Pt identified as at risk on the Malnutrition Screen Tool  INTERVENTION:  -Continue regular diet -MVI with minerals daily -Glucerna Shake po TID, each supplement provides 220 kcal and 10 grams of protein   NUTRITION DIAGNOSIS: Unintentional weight loss related to sub-optimal intake as evidenced by pt report.   Goal: Pt to meet >/= 90% of their estimated nutrition needs.  Monitor:  PO intake  Assessment:  Pt admitted for medical and psychiatric management following intentional overdose. She carries the psychiatric diagnoses of anxiety, depression, and ADHD and has a past medical history of  HTN, diabetes, and arthritis.   Pt admitted with with MDD/ depressive episode, generalized anxiety disorder, and history of polysubstance abuse.   Pt overdosed on medications as suicide attempt; triggered by the loss of her son (from Fentanyl overdose) and marital discord.   Per H&P, pt with good appetite and sleep patterns.   Reviewed wt hx; pt has experienced a 10.6% wt loss over the past 3 months, which is significant for time frame. Pt has been prescribed mounjaro for weight loss and DM management and reports wt loss is intentional. Pt is at increased risk for malnutrition and lack of lean body mass secondary to significant wt loss on weight loss drug. Pt would benefit from addition of oral nutrition supplements.   Pt currently on a regular diet. No meal completion data available to assess at this time.   Medications reviewed.   Labs reviewed.    57 y.o. female  Height: Ht Readings from Last 1 Encounters:  03/31/23 5\' 8"  (1.727 m)    Weight: Wt Readings from Last 1 Encounters:  03/31/23 103.8 kg    Weight Hx: Wt Readings from Last 10 Encounters:  03/31/23 103.8 kg  03/27/23 103.9 kg  12/11/22 116.1 kg  09/09/22 123.4 kg  01/09/21 116.1 kg  10/05/20 116.1 kg  09/07/20 116.1 kg  07/19/20 116.1 kg  07/04/20 116.1 kg  06/29/20 116.1 kg    BMI:  Body  mass index is 34.8 kg/m. Pt meets criteria for obesity, class I based on current BMI. Obesity is a complex, chronic medical condition that is optimally managed by a multidisciplinary care team. Weight loss is not an ideal goal for an acute inpatient hospitalization. However, if further work-up for obesity is warranted, consider outpatient referral to [facility].    Estimated Nutritional Needs: Kcal: 25-30 kcal/kg Protein: > 1 gram protein/kg Fluid: 1 ml/kcal  Diet Order:  Diet Order             Diet regular Room service appropriate? Yes; Fluid consistency: Thin  Diet effective now                  Pt is also offered choice of unit snacks mid-morning and mid-afternoon.  Pt is eating as desired.   Lab results and medications reviewed.   Levada Schilling, RD, LDN, CDCES Registered Dietitian II Certified Diabetes Care and Education Specialist Please refer to [facility] for RD and/or RD on-call/weekend/after hours pager
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
## 2023-04-01 NOTE — BHH Suicide Risk Assessment (Signed)
[facility] Admission Suicide Risk Assessment   Nursing information obtained from:  Patient Demographic factors:  Gay, lesbian, or bisexual orientation Current Mental Status:  Self-harm behaviors Loss Factors:  Loss of significant relationship Risk Reduction Factors:  Sense of responsibility to family   Principal Problem: Bipolar I disorder, most recent episode depressed (HCC) Diagnosis:  Principal Problem:   Bipolar I disorder, most recent episode depressed (HCC) Active Problems:   MDD (major depressive disorder), recurrent episode, severe (HCC)  Subjective Data: Julia [name] is a 57 y.o. female admitted following intentional overdose. She carries the psychiatric diagnoses of anxiety, depression, and ADHD and has a past medical history of  HTN, diabetes, and arthritis   Continued Clinical Symptoms:    The "Alcohol Use Disorders Identification Test", Guidelines for Use in Primary Care, Second Edition.  [employer] writer [facility]). Score between 0-7:  no or low risk or alcohol related problems. Score between 8-15:  moderate risk of alcohol related problems. Score between 16-19:  high risk of alcohol related problems. Score 20 or above:  warrants further diagnostic evaluation for alcohol dependence and treatment.   CLINICAL FACTORS:   Depression:   Anhedonia Hopelessness Impulsivity Insomnia   Musculoskeletal: Strength & Muscle Tone: within normal limits Gait & Station: normal Patient leans: N/A  Psychiatric Specialty Exam:  Presentation  General Appearance:  Appropriate for Environment  Eye Contact: Fair  Speech: Clear and Coherent  Speech Volume: Normal    Mood and Affect  Mood: Depressed  Affect: Congruent; Restricted, Tearful   Thought Process  Thought Processes: Coherent; Goal Directed  Descriptions of Associations:Intact  Orientation:Full (Time, Place and Person)  Thought Content:Abstract Reasoning  History of Schizophrenia/Schizoaffective  disorder:No Hallucinations:Hallucinations: None  Ideas of Reference:None  Suicidal Thoughts:Suicidal Thoughts: Yes, Active SI Active Intent and/or Plan: Without Intent  Homicidal Thoughts:Homicidal Thoughts: No   Sensorium  Memory: Immediate Fair; Recent Fair  Judgment: Impaired  Insight: Shallow   Executive Functions  Concentration: Fair  Attention Span: Fair  Recall: Fair  Fund of Knowledge: Fair  Language: Fair   Psychomotor Activity  Psychomotor Activity: Psychomotor Activity: Psychomotor Retardation   Assets  Assets: Communication Skills; Physical Health; Desire for Improvement   Sleep  Sleep: Poor    Physical Exam: Physical Exam Constitutional:      Appearance: Normal appearance.  HENT:     Head: Normocephalic and atraumatic.     Nose: No congestion.  Eyes:     Pupils: Pupils are equal, round, and reactive to light.  Cardiovascular:     Rate and Rhythm: Normal rate.  Pulmonary:     Effort: Pulmonary effort is normal.  Neurological:     General: No focal deficit present.     Mental Status: She is alert and oriented to person, place, and time.    Review of Systems  Constitutional:  Positive for malaise/fatigue. Negative for chills and fever.  HENT:  Negative for congestion and hearing loss.   Eyes:  Negative for blurred vision and double vision.  Respiratory:  Negative for cough and shortness of breath.   Cardiovascular:  Negative for chest pain and palpitations.  Gastrointestinal:  Negative for heartburn, nausea and vomiting.  Neurological:  Negative for dizziness and speech change.  Psychiatric/Behavioral:  Positive for depression and suicidal ideas. The patient is nervous/anxious and has insomnia.    Blood pressure (!) 114/99, pulse 80, temperature 97.9 F (36.6 C), resp. rate 15, height 5\' 8"  (1.727 m), weight 103.8 kg, SpO2 95%. Body mass index is  34.8 kg/m.   COGNITIVE FEATURES THAT CONTRIBUTE TO RISK:  Polarized  thinking and Thought constriction (tunnel vision)    SUICIDE RISK:   Moderate:  Frequent suicidal ideation with limited intensity, and duration, some specificity in terms of plans,  PLAN OF CARE: Per H&P  I certify that inpatient services furnished can reasonably be expected to improve the patient's condition.   Lewanda Rife, MD

## 2023-04-01 NOTE — H&P (Signed)
Psychiatric Admission Assessment Adult  Patient Identification: Julia [name] MRN:  [id number] Date of Evaluation:  04/01/2023 Chief Complaint: Depression with suicidal thoughts  Principal Diagnosis: Bipolar I disorder, most recent episode depressed (HCC)  History of Present Illness: Julia [name] is a 57 y.o. female admitted following intentional overdose. She carries the psychiatric diagnoses of anxiety, manic depression, and ADHD and has a past medical history of  HTN, diabetes, and arthritis.  Chart reviewed, case discussed in multidisciplinary meeting today, patient seen during rounds.  Patient reports depressed mood, anhedonia, poor sleep, she reports at times she feels helpless and hopeless.  Patient reports that her stressors include separation from her wife for 4 years.  Patient said that her wife's biological son died of fentanyl overdose in 2022-11-28 of this year.  She said since then the relationship has been" not good".  Patient reports that she is grieving her wife's biological son's loss as well. Patient reports they have been in counseling every other week.  Patient shared that his wife moved out of house about a month ago to stay with his wife's mother.  Patient said that sometimes she feels helpless and hopeless and does not want to be here.  These events led to the overdose.  Patient denies any intention of harming herself on the unit.  Patient was provided with support and reassurance.  Patient was encouraged to attend groups and work on coping strategies.  Patient denies manic or psychotic symptoms today.  She reports she has experienced mania in the past including elated mood with racing thoughts, increased goal-directed activity, poor impulse control control, impulsive shopping, and less need to sleep.  Past Psychiatric History: Patient reports past history of bipolar disorder.  She said that she was diagnosed with an "manic depression" 10 years ago.  She denies past history of suicide  attempt or inpatient treatment  Is the patient at risk to self? Yes.    Has the patient been a risk to self in the past 6 months? No.  Has the patient been a risk to self within the distant past? No.  Is the patient a risk to others? No.  Has the patient been a risk to others in the past 6 months? No.  Has the patient been a risk to others within the distant past? No.   [place] Scale:  Flowsheet Row Admission (Current) from 03/31/2023 in [facility] to Hosp-Admission (Discharged) from 03/27/2023 in [facility] 6 [facility]TH  SURGICAL  C-SSRS RISK CATEGORY High Risk High Risk        Prior Inpatient Therapy: No.  Prior Outpatient Therapy: Yes.     Alcohol Screening: Patient refused Alcohol Screening Tool:  (no) 1. How often do you have a drink containing alcohol?: Never 2. How many drinks containing alcohol do you have on a typical day when you are drinking?: 1 or 2 3. How often do you have six or more drinks on one occasion?: Never AUDIT-C Score: 0 Alcohol Brief Interventions/Follow-up: Alcohol education/Brief advice Substance Abuse History in the last 12 months:   Today patient denies use of alcohol, illicit drug, or nicotine UDS negative Blood alcohol level 54  Previous Psychotropic Medications: Yes  Psychological Evaluations: Yes  Past Medical History:  Past Medical History:  Diagnosis Date   Anxiety    Arthritis    knees, hands   Depression    Hypertension     Past Surgical History:  Procedure Laterality Date   ABDOMINAL  HYSTERECTOMY     KNEE ARTHROSCOPY     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   Procedure: RIGHT TOTAL KNEE ARTHROPLASTY;  Surgeon: Valeria Batman, MD;  Location: [facility];  Service: Orthopedics;  Laterality: Right;   TOTAL KNEE ARTHROPLASTY Left 07/04/2020   Procedure: LEFT TOTAL KNEE ARTHROPLASTY;  Surgeon: Valeria Batman, MD;  Location: [facility];  Service: Orthopedics;  Laterality: Left;   Family History:   Family History  Problem Relation Age of Onset   Cancer Mother    Family Psychiatric  History: None reported by the patient Tobacco Screening:  Social History   Tobacco Use  Smoking Status Former   Current packs/day: 0.00   Average packs/day: 1 pack/day for 25.0 years (25.0 ttl pk-yrs)   Types: Cigarettes   Start date: 12/03/1987   Quit date: 12/02/2012   Years since quitting: 10.3  Smokeless Tobacco Never    BH Tobacco Counseling     Are you interested in Tobacco Cessation Medications?  No value filed. Counseled patient on smoking cessation:  No value filed. Reason Tobacco Screening Not Completed: No value filed.       Social History:  Social History   Substance and Sexual Activity  Alcohol Use Not Currently     Social History   Substance and Sexual Activity  Drug Use Never    Additional Social History: Marital status: Married Number of Years Married: 4 What types of issues is patient dealing with in the relationship?: Pt reports there have been no issues until the death Are you sexually active?: No What is your sexual orientation?: "lesbian" Has your sexual activity been affected by drugs, alcohol, medication, or emotional stress?: "yes" Does patient have children?: Yes How many children?: 2 How is patient's relationship with their children?: Pt reports she has a good relationship. Pt states her son was upset about what happened but now he is glad that she's here getting the help he needs, pt reports her daughter feels the same     Patient reports that she has support from her son and daughter.  She plans to stay with them upon discharge from this facility                    Allergies:   Allergies  Allergen Reactions   Other Cough, Itching and Other (See Comments)    Congestion,cough,itchy eyes   Sulfamethoxazole-Trimethoprim Hives and Other (See Comments)    Burning sensation and redness   Penicillins Itching   Lab Results:  Results for orders  placed or performed during the hospital encounter of 03/27/23 (from the past 48 hour(s))  Comprehensive metabolic panel     Status: Abnormal   Collection Time: 03/31/23  2:11 AM  Result Value Ref Range   Sodium 140 135 - 145 mmol/L   Potassium 3.8 3.5 - 5.1 mmol/L   Chloride 109 98 - 111 mmol/L   CO2 23 22 - 32 mmol/L   Glucose, Bld 87 70 - 99 mg/dL    Comment: Glucose reference range applies only to samples taken after fasting for at least 8 hours.   BUN 18 6 - 20 mg/dL   Creatinine, Ser 3.23 (H) 0.44 - 1.00 mg/dL   Calcium 8.9 8.9 - 55.7 mg/dL   Total Protein 6.6 6.5 - 8.1 g/dL   Albumin 3.5 3.5 - 5.0 g/dL   AST 42 (H) 15 - 41 U/L   ALT 71 (H) 0 - 44 U/L   Alkaline Phosphatase 78 38 -  126 U/L   Total Bilirubin 0.6 0.3 - 1.2 mg/dL   GFR, Estimated 25 (L) >60 mL/min    Comment: (NOTE) Calculated using the CKD-EPI Creatinine Equation (2021)    Anion gap 8 5 - 15    Comment: Performed at [facility], [address] [place], [place] [address]  CBC     Status: Abnormal   Collection Time: 03/31/23  2:11 AM  Result Value Ref Range   WBC 6.9 4.0 - 10.5 K/uL   RBC 3.60 (L) 3.87 - 5.11 MIL/uL   Hemoglobin 13.0 12.0 - 15.0 g/dL   HCT 60.4 54.0 - 98.1 %   MCV 104.4 (H) 80.0 - 100.0 fL   MCH 36.1 (H) 26.0 - 34.0 pg   MCHC 34.6 30.0 - 36.0 g/dL   RDW 19.1 47.8 - 29.5 %   Platelets 278 150 - 400 K/uL   nRBC 0.0 0.0 - 0.2 %    Comment: Performed at [facility], [address], [place], [place] [address]  Magnesium     Status: None   Collection Time: 03/31/23  2:11 AM  Result Value Ref Range   Magnesium 1.9 1.7 - 2.4 mg/dL    Comment: Performed at [facility], [address] [place], [place] [address]  SARS Coronavirus 2 by RT PCR (hospital order, performed in [facility] hospital lab) *cepheid single result test* Anterior Nasal Swab     Status: None   Collection Time: 03/31/23 11:40 AM   Specimen: Anterior Nasal Swab  Result Value Ref Range   SARS Coronavirus 2  by RT PCR NEGATIVE NEGATIVE    Comment: Performed at [facility], [address], [place], [place] [address]    Blood Alcohol level:  Lab Results  Component Value Date   ETH 54 (H) 03/27/2023    Metabolic Disorder Labs:  Lab Results  Component Value Date   HGBA1C 6.0 (H) 03/28/2023   MPG 126 03/28/2023   No results found for: "PROLACTIN" Lab Results  Component Value Date   CHOL 150 03/29/2023   TRIG 88 03/29/2023   HDL 48 03/29/2023   CHOLHDL 3.1 03/29/2023   VLDL 18 03/29/2023   LDLCALC 84 03/29/2023    Current Medications: Current Facility-Administered Medications  Medication Dose Route Frequency Provider Last Rate Last Admin   acetaminophen (TYLENOL) tablet 650 mg  650 mg Oral Q6H PRN Maryagnes Amos, FNP       alum & mag hydroxide-simeth (MAALOX/MYLANTA) 200-200-20 MG/5ML suspension 30 mL  30 mL Oral Q4H PRN Starkes-Perry, Juel Burrow, FNP       amLODipine (NORVASC) tablet 5 mg  5 mg Oral Daily Maryagnes Amos, FNP   5 mg at 04/01/23 0851   brexpiprazole (REXULTI) tablet 1 mg  1 mg Oral Daily Maryagnes Amos, FNP   1 mg at 04/01/23 0850   diphenhydrAMINE (BENADRYL) capsule 50 mg  50 mg Oral TID PRN Maryagnes Amos, FNP       Or   diphenhydrAMINE (BENADRYL) injection 50 mg  50 mg Intramuscular TID PRN Starkes-Perry, Juel Burrow, FNP       feeding supplement (ENSURE ENLIVE / ENSURE PLUS) liquid 237 mL  237 mL Oral BID BM Herrick, Richard Ramon Dredge, DO       feeding supplement (GLUCERNA SHAKE) (GLUCERNA SHAKE) liquid 237 mL  237 mL Oral TID BM Lewanda Rife, MD   237 mL at 04/01/23 0855   haloperidol (HALDOL) tablet 5 mg  5 mg Oral TID PRN Maryagnes Amos, FNP       Or   haloperidol lactate (HALDOL) injection 5 mg  5 mg Intramuscular TID PRN Starkes-Perry, Juel Burrow, FNP       irbesartan (AVAPRO) tablet 150 mg  150 mg Oral Once Belue, Nathan S, RPH       irbesartan (AVAPRO) tablet 300 mg  300 mg Oral Daily Maryagnes Amos, FNP   300  mg at 04/01/23 8295   LORazepam (ATIVAN) tablet 1-4 mg  1-4 mg Oral Q4H PRN Maryagnes Amos, FNP       Or   LORazepam (ATIVAN) injection 1-4 mg  1-4 mg Intramuscular Q4H PRN Starkes-Perry, Juel Burrow, FNP       LORazepam (ATIVAN) tablet 2 mg  2 mg Oral TID PRN Maryagnes Amos, FNP   2 mg at 03/31/23 2211   Or   LORazepam (ATIVAN) injection 2 mg  2 mg Intramuscular TID PRN Maryagnes Amos, FNP       magnesium hydroxide (MILK OF MAGNESIA) suspension 30 mL  30 mL Oral Daily PRN Starkes-Perry, Juel Burrow, FNP       multivitamin with minerals tablet 1 tablet  1 tablet Oral Daily Lewanda Rife, MD   1 tablet at 04/01/23 0851   QUEtiapine (SEROQUEL) tablet 50-100 mg  50-100 mg Oral QHS PRN Maryagnes Amos, FNP   100 mg at 03/31/23 2214   Tiotropium Bromide-Olodaterol 2.5-2.5 MCG/ACT AERS 2 Inhalation  2 Inhalation Inhalation Daily Otelia Sergeant, RPH   2 Inhalation at 04/01/23 0855   tirzepatide (MOUNJARO) Pen 10 mg  10 mg Subcutaneous Weekly Starkes-Perry, Juel Burrow, FNP       traZODone (DESYREL) tablet 100 mg  100 mg Oral QHS PRN Maryagnes Amos, FNP       PTA Medications: Medications Prior to Admission  Medication Sig Dispense Refill Last Dose   albuterol (VENTOLIN HFA) 108 (90 Base) MCG/ACT inhaler Inhale 2 puffs into the lungs every 6 (six) hours as needed for wheezing or shortness of breath. 8 g 6    amLODipine (NORVASC) 5 MG tablet Take 1 tablet (5 mg total) by mouth daily. 30 tablet 0    brexpiprazole (REXULTI) 1 MG TABS tablet Take 1 mg by mouth daily.      clonazePAM (KLONOPIN) 1 MG tablet Take 1 mg by mouth See admin instructions. Take 1 tablet (1 mg) by mouth scheduled every evening, may take an additional dose during the day if needed for anxiety.      diclofenac Sodium (VOLTAREN) 1 % GEL Apply 2-4 g topically 4 (four) times daily as needed. 100 g 1    DULoxetine (CYMBALTA) 60 MG capsule Take 120 mg by mouth daily.      MOUNJARO 10 MG/0.5ML Pen Inject 10  mg into the skin once a week.      QUEtiapine (SEROQUEL) 50 MG tablet Take 50-100 mg by mouth at bedtime as needed (sleep).      Tiotropium Bromide-Olodaterol (STIOLTO RESPIMAT) 2.5-2.5 MCG/ACT AERS Inhale 2 puffs into the lungs daily. 4 g 11    traZODone (DESYREL) 100 MG tablet Take 100 mg by mouth at bedtime. (Patient not taking: Reported on 03/28/2023)       Musculoskeletal: Strength & Muscle Tone: within normal limits Gait & Station: normal Patient leans: N/A   Psychiatric Specialty Exam:   Presentation  General Appearance:  Appropriate for Environment   Eye Contact: Fair   Speech: Clear and Coherent  Speech Volume: Normal       Mood and Affect  Mood: Depressed   Affect: Congruent; Restricted, Tearful     Thought Process  Coherent; Goal Directed   Descriptions of Associations:Intact   Orientation:Full (Time, Place and Person)   Thought Content:Abstract Reasoning   History of Schizophrenia/Schizoaffective disorder:No Hallucinations:Hallucinations: None   Ideas of Reference:None   Suicidal Thoughts:Suicidal Thoughts: Yes, Active SI Active Intent and/or Plan: Without Intent   Homicidal Thoughts:Homicidal Thoughts: No     Sensorium  Memory: Immediate Fair; Recent Fair   Judgment: Impaired   Insight: Shallow     Executive Functions  Concentration: Fair   Attention Span: Fair   Recall: [employer] of Knowledge: Fair   Language: Fair     Psychomotor Activity  Psychomotor Activity: Doctor, general practice; Physical Health; Desire for Improvement     Sleep  Poor       Physical Exam: Constitutional:      Appearance: Normal appearance.  HENT:     Head: Normocephalic and atraumatic.     Nose: No congestion.  Eyes:     Pupils: Pupils are equal, round, and reactive to light.  Cardiovascular:     Rate and Rhythm: Normal rate.  Pulmonary:     Effort: Pulmonary effort is normal.   Neurological:     General: No focal deficit present.     Mental Status: She is alert and oriented to person, place, and time.      Review of Systems  Constitutional:  Positive for malaise/fatigue. Negative for chills and fever.  HENT:  Negative for congestion and hearing loss.   Eyes:  Negative for blurred vision and double vision.  Respiratory:  Negative for cough and shortness of breath.   Cardiovascular:  Negative for chest pain and palpitations.  Gastrointestinal:  Negative for heartburn, nausea and vomiting.  Neurological:  Negative for dizziness and speech change.  Psychiatric/Behavioral:  Positive for depression and suicidal ideas. The patient is nervous/anxious and has insomnia.    Blood pressure (!) 114/99, pulse 80, temperature 97.9 F (36.6 C), resp. rate 15, height 5\' 8"  (1.727 m), weight 103.8 kg, SpO2 95%. Body mass index is 34.8 kg/m.  Treatment Plan Summary: Daily contact with patient to assess and evaluate symptoms and progress in treatment and Medication management  Observation Level/Precautions:  15 minute checks  Laboratory:  HbAIC, lipid profile reviewed  Psychotherapy:    Medications:    Consultations:    Discharge Concerns:    Estimated LOS: 5-7 days  Other:     Physician Treatment Plan for Primary Diagnosis: Bipolar I disorder, most recent episode depressed (HCC) Long Term Goal(s): Improvement in symptoms so as ready for discharge  Short Term Goals: Ability to identify changes in lifestyle to reduce recurrence of condition will improve, Ability to verbalize feelings will improve, Ability to disclose and discuss suicidal ideas, Ability to demonstrate self-control will improve, Ability to identify and develop effective coping behaviors will improve, Ability to maintain clinical measurements within normal limits will improve, Compliance with prescribed medications will improve, and Ability to identify triggers associated with substance abuse/mental health  issues will improve Patient is admitted to locked unit under safety precautions Will continue on home medicines including Rexulti 1 mg daily Duloxetine 60 mg by mouth daily Patient was encouraged to attend group and work on coping strategies and a safe discharge plan Will consult social worker to help with a safe discharge and to  get collateral info from family  I certify that inpatient services furnished can reasonably be expected to improve the patient's condition.    Lewanda Rife, MD

## 2023-04-02 ENCOUNTER — Encounter: Payer: Self-pay | Admitting: Family

## 2023-04-02 DIAGNOSIS — F3130 Bipolar disorder, current episode depressed, mild or moderate severity, unspecified: Secondary | ICD-10-CM | POA: Diagnosis not present

## 2023-04-02 NOTE — Group Note (Signed)
Date:  04/02/2023 Time:  10:21 PM  Group Topic/Focus:  Crisis Planning:   The purpose of this group is to help patients create a crisis plan for use upon discharge or in the future, as needed.    Participation Level:  Did Not Attend  Participation Quality:   Did Not Attend  Affect:   Did Not Attend  Cognitive:   Did Not Attend  Insight: None  Engagement in Group:  None  Modes of Intervention:   Did Not Attend  Additional Comments:    Garry Heater 04/02/2023, 10:21 PM

## 2023-04-02 NOTE — [facility] Counselor (Signed)
CSW contacted pt's son [place] (per request of provider for collateral.   Leta Jungling states his step-mother's (pt's wife) son passed in April 2024.   Leta Jungling states after that incident it's been hard for his mom to deal with her wife and her wife leaving to go to [place].   Leta Jungling states he's not necessarily an expert on whether his mom is a danger to herself or others, but he does plan on sharing the care of his mom with his sister.   He states pt will spend half her time at his house and half her time at his sister's house.   Leta Jungling states that pt will not have access to guns, weapons or excessive amounts of medications, and the only thing she will have access to is something she got on her own.   Leta Jungling states all items of danger will be locked up at his house.   Leta Jungling reports pt has never threatened to cause harm to any other individual or herself in the past.   Joslyn Hy states he would want to see what medicines she's on if she was discharged because he feels they need to be changes.   Leta Jungling states it is a lot of responsibility for one person. He would like to have multiple people involved in pts care.   CSW agreed and stated she would consult with pt about contracting for safety with pt's wife and daughter as well.   CSW provided support and encouragement to call back if there is any follow-up questions or concerns   Reynaldo Minium, MSW, [facility] 04/02/2023 1:11 PM

## 2023-04-02 NOTE — Progress Notes (Signed)
   04/02/23 1945  Psych Admission Type (Psych Patients Only)  Admission Status Involuntary  Psychosocial Assessment  Patient Complaints Anxiety;Depression;Other (Comment) (tearful)  Eye Contact Fair  Facial Expression Flat  Affect Depressed  Speech Logical/coherent  Interaction Assertive  Motor Activity Slow  Appearance/Hygiene Unremarkable  Behavior Characteristics Cooperative;Calm  Mood Depressed;Anxious  Thought Process  Coherency WDL  Content WDL  Delusions None reported or observed  Perception WDL  Hallucination None reported or observed  Judgment WDL  Confusion None  Danger to Self  Current suicidal ideation? Denies  Danger to Others  Danger to Others None reported or observed   Progress note   D: Pt seen in dayroom. Pt denies SI, HI, AVH. Pt rates pain  10/10 in her right trigger finger. Area is puffy and finger appears to be dislocated at the joint. "I need surgery on it." Limited movement in that finger. Pt rates anxiety  5/10 and depression  6/10. Pt has been interacting with other patients today and not isolating to her room. Spoke with her children and her best friend. "I will be staying with each of them after discharge. My wife is back at the house with the dogs." Pt states that she needs to spend some time in self-care and the grieving process. Her stepson died by fentanyl overdose earlier this year. One of pt's goals is to get her FMLA paperwork dropped off at her job so it can be processed. States that it would go a long way to easing her anxiety. No other concerns noted at this time.  A: Pt provided support and encouragement. Pt given scheduled medication as prescribed. PRNs as appropriate. Q15 min checks for safety.   R: Pt safe on the unit. Will continue to monitor.

## 2023-04-02 NOTE — Progress Notes (Signed)
   04/02/23 1400  Spiritual Encounters  Type of Visit Initial  Care provided to: Patient  Reason for visit Routine spiritual support  OnCall Visit No   Chaplain introduced herself to the patient and let her know that chaplain services is here for her if she would like to talk. The patient thanked the chaplain and talked about her time in [place] and how important her sobriety is to her. Chaplain will follow up with patient tomorrow for further spiritual care.

## 2023-04-02 NOTE — Progress Notes (Signed)
[facility] MD Progress Note  04/02/2023 1:06 PM Julia [name]  MRN:  [id number]  Julia [name] is a 57 y.o. female admitted following intentional overdose. She carries the psychiatric diagnoses of anxiety, manic depression, and ADHD and has a past medical history of  HTN, diabetes, and arthritis.   Subjective: Chart reviewed, case discussed in multidisciplinary meeting today.  Patient seen during a.m. rounds and also in treatment team meeting today.  Patient reports she feels somewhat better.  She denies suicidal or homicidal ideation today.  Patient said that her goal is to learn how to do more self-care.  Patient has been attending groups and working on coping strategies.  Social worker got collateral information from patient's son.  Patient's son confirmed that patient will be staying with him or patient's daughter upon discharge from this facility.  Patient reports she has good support from her both children.  Patient feels close to her baseline.  Principal Problem: Bipolar I disorder, most recent episode depressed (HCC) Diagnosis: Principal Problem:   Bipolar I disorder, most recent episode depressed (HCC) Active Problems:   MDD (major depressive disorder), recurrent episode, severe (HCC)   Past Psychiatric History:  Patient reports past history of bipolar disorder.  She said that she was diagnosed with an "manic depression" 10 years ago.  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   Procedure: RIGHT TOTAL KNEE ARTHROPLASTY;  Surgeon: Valeria Batman, MD;  Location: [facility];  Service: Orthopedics;  Laterality: Right;   TOTAL KNEE ARTHROPLASTY Left 07/04/2020   Procedure: LEFT TOTAL KNEE  ARTHROPLASTY;  Surgeon: Valeria Batman, MD;  Location: [facility];  Service: Orthopedics;  Laterality: Left;   Family History:  Family History  Problem Relation Age of Onset   Cancer Mother     Social History:  Social History   Substance and Sexual Activity  Alcohol Use Not Currently     Social History   Substance and Sexual Activity  Drug Use Never    Social History   Socioeconomic History   Marital status: Married    Spouse name: Not on file   Number of children: Not on file   Years of education: Not on file   Highest education level: Not on file  Occupational History   Not on file  Tobacco Use   Smoking status: Former    Current packs/day: 0.00    Average packs/day: 1 pack/day for 25.0 years (25.0 ttl pk-yrs)    Types: Cigarettes    Start date: 12/03/1987    Quit date: 12/02/2012    Years since quitting: 10.3   Smokeless tobacco: Never  Vaping Use   Vaping status: Former   Quit date: 07/15/2022   Substances: Nicotine, Flavoring  Substance and Sexual Activity   Alcohol use: Not Currently   Drug use: Never   Sexual activity: Yes  Other Topics Concern   Not on file  Social History Narrative   Not on file   Social Determinants of Health   Financial Resource Strain: Patient Declined (08/01/2022)   Received from [facility], [facility]   Overall Financial Resource Strain (CARDIA)  Difficulty of Paying Living Expenses: Patient declined  Food Insecurity: No Food Insecurity (04/01/2023)   Hunger Vital Sign    Worried About Running Out of Food in the Last Year: Never true    Ran Out of Food in the Last Year: Never true  Transportation Needs: No Transportation Needs (04/01/2023)   PRAPARE - Administrator, Civil Service (Medical): No    Lack of Transportation (Non-Medical): No  Physical Activity: Sufficiently Active (07/16/2022)   Received from [facility], [facility]   Exercise Vital Sign    Days of Exercise per Week: 6 days    Minutes of  Exercise per Session: 60 min  Stress: No Stress Concern Present (08/01/2022)   Received from [facility], [facility] of Occupational Health - Occupational Stress Questionnaire    Feeling of Stress : Only a little  Social Connections: Socially Integrated (07/16/2022)   Received from [facility], [facility]   Social Network    How would you rate your social network (family, work, friends)?: Good participation with social networks  Recent Concern: Social Connections - Somewhat Isolated (04/19/2022)   Received from [employer]   Social Network    How would you rate your social network (family, work, friends)?: Restricted participation with some degree of social isolation   Additional Social History:                         Sleep: Fair  Appetite:  Fair  Current Medications: Current Facility-Administered Medications  Medication Dose Route Frequency Provider Last Rate Last Admin   acetaminophen (TYLENOL) tablet 650 mg  650 mg Oral Q6H PRN Starkes-Perry, Juel Burrow, FNP       alum & mag hydroxide-simeth (MAALOX/MYLANTA) 200-200-20 MG/5ML suspension 30 mL  30 mL Oral Q4H PRN Starkes-Perry, Juel Burrow, FNP       amLODipine (NORVASC) tablet 5 mg  5 mg Oral Daily Maryagnes Amos, FNP   5 mg at 04/02/23 0955   brexpiprazole (REXULTI) tablet 1 mg  1 mg Oral Daily Maryagnes Amos, FNP   1 mg at 04/02/23 0955   diphenhydrAMINE (BENADRYL) capsule 50 mg  50 mg Oral TID PRN Maryagnes Amos, FNP       Or   diphenhydrAMINE (BENADRYL) injection 50 mg  50 mg Intramuscular TID PRN Starkes-Perry, Juel Burrow, FNP       DULoxetine (CYMBALTA) DR capsule 60 mg  60 mg Oral Daily Lewanda Rife, MD   60 mg at 04/02/23 0955   feeding supplement (ENSURE ENLIVE / ENSURE PLUS) liquid 237 mL  237 mL Oral BID BM Sarina Ill, DO       feeding supplement (GLUCERNA SHAKE) (GLUCERNA SHAKE) liquid 237 mL  237 mL Oral TID BM Lewanda Rife, MD   237 mL at  04/01/23 2117   haloperidol (HALDOL) tablet 5 mg  5 mg Oral TID PRN Maryagnes Amos, FNP       Or   haloperidol lactate (HALDOL) injection 5 mg  5 mg Intramuscular TID PRN Starkes-Perry, Juel Burrow, FNP       irbesartan (AVAPRO) tablet 300 mg  300 mg Oral Daily Maryagnes Amos, FNP   300 mg at 04/02/23 0955   LORazepam (ATIVAN) tablet 2 mg  2 mg Oral TID PRN Maryagnes Amos, FNP   2 mg at 03/31/23 2211   Or   LORazepam (ATIVAN) injection 2 mg  2 mg Intramuscular  TID PRN Maryagnes Amos, FNP       magnesium hydroxide (MILK OF MAGNESIA) suspension 30 mL  30 mL Oral Daily PRN Starkes-Perry, Juel Burrow, FNP       multivitamin with minerals tablet 1 tablet  1 tablet Oral Daily Lewanda Rife, MD   1 tablet at 04/02/23 0955   QUEtiapine (SEROQUEL) tablet 50-100 mg  50-100 mg Oral QHS PRN Maryagnes Amos, FNP   100 mg at 04/01/23 2116   Tiotropium Bromide-Olodaterol 2.5-2.5 MCG/ACT AERS 2 Inhalation  2 Inhalation Inhalation Daily Otelia Sergeant, RPH   2 Inhalation at 04/02/23 1006   tirzepatide (MOUNJARO) Pen 10 mg  10 mg Subcutaneous Weekly Starkes-Perry, Juel Burrow, FNP       traZODone (DESYREL) tablet 100 mg  100 mg Oral QHS PRN Maryagnes Amos, FNP        Lab Results: No results found for this or any previous visit (from the past 48 hour(s)).  Blood Alcohol level:  Lab Results  Component Value Date   ETH 54 (H) 03/27/2023    Metabolic Disorder Labs: Lab Results  Component Value Date   HGBA1C 6.0 (H) 03/28/2023   MPG 126 03/28/2023   No results found for: "PROLACTIN" Lab Results  Component Value Date   CHOL 150 03/29/2023   TRIG 88 03/29/2023   HDL 48 03/29/2023   CHOLHDL 3.1 03/29/2023   VLDL 18 03/29/2023   LDLCALC 84 03/29/2023      Musculoskeletal: Strength & Muscle Tone: within normal limits Gait & Station: normal Patient leans: N/A   Psychiatric Specialty Exam:   Presentation  General Appearance:  Appropriate for Environment    Eye Contact: Fair   Speech: Clear and Coherent   Speech Volume: Normal       Mood and Affect  Mood: Better  Affect: Less constricted   Thought Process  Coherent; Goal Directed   Descriptions of Associations:Intact   Orientation:Full (Time, Place and Person)   Thought Content:Abstract Reasoning   History of Schizophrenia/Schizoaffective disorder:No  Hallucinations:Hallucinations: None   Ideas of Reference:None   Suicidal Thoughts:Suicidal Thoughts: Denies   Homicidal Thoughts:Homicidal Thoughts: No     Sensorium  Memory: Immediate Fair; Recent Fair   Judgment: Improving   Insight: Improving     Executive Functions  Concentration: Fair   Attention Span: Fair   Recall: [employer] of Knowledge: Fair   Language: Fair     Psychomotor Activity  Psychomotor Activity: Normal     Agricultural engineer; Physical Health; Desire for Improvement     Sleep  Fair     Physical Exam: Constitutional:      Appearance: Normal appearance.  HENT:     Head: Normocephalic and atraumatic.     Nose: No congestion.  Eyes:     Pupils: Pupils are equal, round, and reactive to light.  Cardiovascular:     Rate and Rhythm: Normal rate.  Pulmonary:     Effort: Pulmonary effort is normal.  Neurological:     General: No focal deficit present.     Mental Status: She is alert and oriented to person, place, and time.      Review of Systems  Constitutional: Negative for chills and fever.  HENT:  Negative for congestion and hearing loss.   Eyes:  Negative for blurred vision and double vision.  Respiratory:  Negative for cough and shortness of breath.   Cardiovascular:  Negative for chest pain and palpitations.  Gastrointestinal:  Negative  for heartburn, nausea and vomiting.  Neurological:  Negative for dizziness and speech change.     Blood pressure 119/71, pulse 96, temperature 98.5 F (36.9 C), resp. rate 16, height 5\' 8"  (1.727 m), weight  103.8 kg, SpO2 93%. Body mass index is 34.8 kg/m.   Treatment Plan Summary: Daily contact with patient to assess and evaluate symptoms and progress in treatment and Medication management  Patient is admitted to locked unit under safety precautions Will continue on home medicines including Rexulti 1 mg daily Duloxetine 60 mg by mouth daily Patient was encouraged to attend group and work on coping strategies and a safe discharge plan Will consult social worker to help with a safe discharge and to get collateral info from family  Lewanda Rife, MD

## 2023-04-02 NOTE — Group Note (Signed)
Recreation Therapy Group Note   Group Topic:Communication  Group Date: 04/02/2023 Start Time: 1400 End Time: 1445 Facilitators: Rosina Lowenstein, LRT, CTRS Location: Courtyard   Group Description: Emotional Check in. Patient sat and talked with LRT about how they are doing and whatever else is on their mind. LRT provided active listening, reassurance and encouragement. Pts were given the opportunity to listen to music or play cornhole while getting fresh air and sunlight in the courtyard.    Goal Area(s) Addressed: Patient will engage in conversation with LRT. Patient will communicate their wants, needs, or questions.  Patient will practice a new coping skill of "talking to someone".    Affect/Mood: Appropriate   Participation Level: Active and Engaged   Participation Quality: Independent   Behavior: Appropriate, Calm, and Cooperative   Speech/Thought Process: Coherent   Insight: Good   Judgement: Good   Modes of Intervention: Open Conversation   Patient Response to Interventions:  Attentive, Engaged, Interested , and Receptive   Education Outcome:  Acknowledges education   Clinical Observations/Individualized Feedback: [place] was active in their participation of session activities and group discussion. Pt shared that she misses her 3 dogs. She went into detail on their names and what type of dog they are. Pt also engaged in conversation about tattoos. Pt interacted well with LRT and peers duration of session.   Plan: Continue to engage patient in RT group sessions 2-3x/week.   Rosina Lowenstein, LRT, CTRS 04/02/2023 2:47 PM

## 2023-04-02 NOTE — Group Note (Signed)
Date:  04/02/2023 Time:  11:05 AM  Group Topic/Focus:  Goals Group:The focus of this group is to help patients establish short term and long term goals.Creating a realistic plan that the patients can accomplish when they are discharged.    Participation Level:  Active  Participation Quality:  Appropriate  Affect:  Appropriate  Cognitive:  Alert and Appropriate  Insight: Appropriate  Engagement in Group:  Engaged  Modes of Intervention:  Activity and Discussion  Additional Comments:    Marta Antu 04/02/2023, 11:05 AM

## 2023-04-02 NOTE — BH IP Treatment Plan (Signed)
Interdisciplinary Treatment and Diagnostic Plan Update  04/02/2023 Time of Session: 10:15 AM  Julia [name] MRN: [id number]  Principal Diagnosis: Bipolar I disorder, most recent episode depressed (HCC)  Secondary Diagnoses: Principal Problem:   Bipolar I disorder, most recent episode depressed (HCC) Active Problems:   MDD (major depressive disorder), recurrent episode, severe (HCC)   Current Medications:  Current Facility-Administered Medications  Medication Dose Route Frequency Provider Last Rate Last Admin   acetaminophen (TYLENOL) tablet 650 mg  650 mg Oral Q6H PRN Starkes-Perry, Juel Burrow, FNP       alum & mag hydroxide-simeth (MAALOX/MYLANTA) 200-200-20 MG/5ML suspension 30 mL  30 mL Oral Q4H PRN Starkes-Perry, Juel Burrow, FNP       amLODipine (NORVASC) tablet 5 mg  5 mg Oral Daily Maryagnes Amos, FNP   5 mg at 04/02/23 0955   brexpiprazole (REXULTI) tablet 1 mg  1 mg Oral Daily Maryagnes Amos, FNP   1 mg at 04/02/23 0955   diphenhydrAMINE (BENADRYL) capsule 50 mg  50 mg Oral TID PRN Maryagnes Amos, FNP       Or   diphenhydrAMINE (BENADRYL) injection 50 mg  50 mg Intramuscular TID PRN Starkes-Perry, Juel Burrow, FNP       DULoxetine (CYMBALTA) DR capsule 60 mg  60 mg Oral Daily Lewanda Rife, MD   60 mg at 04/02/23 0955   feeding supplement (ENSURE ENLIVE / ENSURE PLUS) liquid 237 mL  237 mL Oral BID BM Sarina Ill, DO       feeding supplement (GLUCERNA SHAKE) (GLUCERNA SHAKE) liquid 237 mL  237 mL Oral TID BM Lewanda Rife, MD   237 mL at 04/01/23 2117   haloperidol (HALDOL) tablet 5 mg  5 mg Oral TID PRN Maryagnes Amos, FNP       Or   haloperidol lactate (HALDOL) injection 5 mg  5 mg Intramuscular TID PRN Starkes-Perry, Juel Burrow, FNP       irbesartan (AVAPRO) tablet 300 mg  300 mg Oral Daily Maryagnes Amos, FNP   300 mg at 04/02/23 0955   LORazepam (ATIVAN) tablet 2 mg  2 mg Oral TID PRN Maryagnes Amos, FNP   2 mg at  03/31/23 2211   Or   LORazepam (ATIVAN) injection 2 mg  2 mg Intramuscular TID PRN Maryagnes Amos, FNP       magnesium hydroxide (MILK OF MAGNESIA) suspension 30 mL  30 mL Oral Daily PRN Starkes-Perry, Juel Burrow, FNP       multivitamin with minerals tablet 1 tablet  1 tablet Oral Daily Lewanda Rife, MD   1 tablet at 04/02/23 0955   QUEtiapine (SEROQUEL) tablet 50-100 mg  50-100 mg Oral QHS PRN Maryagnes Amos, FNP   100 mg at 04/01/23 2116   Tiotropium Bromide-Olodaterol 2.5-2.5 MCG/ACT AERS 2 Inhalation  2 Inhalation Inhalation Daily Otelia Sergeant, RPH   2 Inhalation at 04/02/23 1006   tirzepatide (MOUNJARO) Pen 10 mg  10 mg Subcutaneous Weekly Starkes-Perry, Juel Burrow, FNP       traZODone (DESYREL) tablet 100 mg  100 mg Oral QHS PRN Maryagnes Amos, FNP       PTA Medications: Medications Prior to Admission  Medication Sig Dispense Refill Last Dose   albuterol (VENTOLIN HFA) 108 (90 Base) MCG/ACT inhaler Inhale 2 puffs into the lungs every 6 (six) hours as needed for wheezing or shortness of breath. 8 g 6    amLODipine (NORVASC) 5 MG tablet Take 1  tablet (5 mg total) by mouth daily. 30 tablet 0    brexpiprazole (REXULTI) 1 MG TABS tablet Take 1 mg by mouth daily.      clonazePAM (KLONOPIN) 1 MG tablet Take 1 mg by mouth See admin instructions. Take 1 tablet (1 mg) by mouth scheduled every evening, may take an additional dose during the day if needed for anxiety.      diclofenac Sodium (VOLTAREN) 1 % GEL Apply 2-4 g topically 4 (four) times daily as needed. 100 g 1    DULoxetine (CYMBALTA) 60 MG capsule Take 120 mg by mouth daily.      MOUNJARO 10 MG/0.5ML Pen Inject 10 mg into the skin once a week.      QUEtiapine (SEROQUEL) 50 MG tablet Take 50-100 mg by mouth at bedtime as needed (sleep).      Tiotropium Bromide-Olodaterol (STIOLTO RESPIMAT) 2.5-2.5 MCG/ACT AERS Inhale 2 puffs into the lungs daily. 4 g 11    traZODone (DESYREL) 100 MG tablet Take 100 mg by mouth at  bedtime. (Patient not taking: Reported on 03/28/2023)       Patient Stressors:    Patient Strengths:    Treatment Modalities: Medication Management, Group therapy, Case management,  1 to 1 session with clinician, Psychoeducation, Recreational therapy.   Physician Treatment Plan for Primary Diagnosis: Bipolar I disorder, most recent episode depressed (HCC) Long Term Goal(s): Improvement in symptoms so as ready for discharge   Short Term Goals: Ability to identify changes in lifestyle to reduce recurrence of condition will improve Ability to verbalize feelings will improve Ability to disclose and discuss suicidal ideas Ability to demonstrate self-control will improve Ability to identify and develop effective coping behaviors will improve Ability to maintain clinical measurements within normal limits will improve Compliance with prescribed medications will improve Ability to identify triggers associated with substance abuse/mental health issues will improve  Medication Management: Evaluate patient's response, side effects, and tolerance of medication regimen.  Therapeutic Interventions: 1 to 1 sessions, Unit Group sessions and Medication administration.  Evaluation of Outcomes: Progressing  Physician Treatment Plan for Secondary Diagnosis: Principal Problem:   Bipolar I disorder, most recent episode depressed (HCC) Active Problems:   MDD (major depressive disorder), recurrent episode, severe (HCC)  Long Term Goal(s): Improvement in symptoms so as ready for discharge   Short Term Goals: Ability to identify changes in lifestyle to reduce recurrence of condition will improve Ability to verbalize feelings will improve Ability to disclose and discuss suicidal ideas Ability to demonstrate self-control will improve Ability to identify and develop effective coping behaviors will improve Ability to maintain clinical measurements within normal limits will improve Compliance with  prescribed medications will improve Ability to identify triggers associated with substance abuse/mental health issues will improve     Medication Management: Evaluate patient's response, side effects, and tolerance of medication regimen.  Therapeutic Interventions: 1 to 1 sessions, Unit Group sessions and Medication administration.  Evaluation of Outcomes: Progressing   RN Treatment Plan for Primary Diagnosis: Bipolar I disorder, most recent episode depressed (HCC) Long Term Goal(s): Knowledge of disease and therapeutic regimen to maintain health will improve  Short Term Goals: Ability to remain free from injury will improve, Ability to verbalize frustration and anger appropriately will improve, Ability to demonstrate self-control, Ability to participate in decision making will improve, Ability to verbalize feelings will improve, Ability to disclose and discuss suicidal ideas, Ability to identify and develop effective coping behaviors will improve, and Compliance with prescribed medications will improve  Medication  Management: RN will administer medications as ordered by provider, will assess and evaluate patient's response and provide education to patient for prescribed medication. RN will report any adverse and/or side effects to prescribing provider.  Therapeutic Interventions: 1 on 1 counseling sessions, Psychoeducation, Medication administration, Evaluate responses to treatment, Monitor vital signs and CBGs as ordered, Perform/monitor CIWA, COWS, AIMS and Fall Risk screenings as ordered, Perform wound care treatments as ordered.  Evaluation of Outcomes: Progressing   LCSW Treatment Plan for Primary Diagnosis: Bipolar I disorder, most recent episode depressed (HCC) Long Term Goal(s): Safe transition to appropriate next level of care at discharge, Engage patient in therapeutic group addressing interpersonal concerns.  Short Term Goals: Engage patient in aftercare planning with referrals  and resources, Increase social support, Increase ability to appropriately verbalize feelings, Increase emotional regulation, Facilitate acceptance of mental health diagnosis and concerns, Facilitate patient progression through stages of change regarding substance use diagnoses and concerns, and Increase skills for wellness and recovery  Therapeutic Interventions: Assess for all discharge needs, 1 to 1 time with Social worker, Explore available resources and support systems, Assess for adequacy in community support network, Educate family and significant other(s) on suicide prevention, Complete Psychosocial Assessment, Interpersonal group therapy.  Evaluation of Outcomes: Progressing   Progress in Treatment: Attending groups: Yes. Participating in groups: Yes. Taking medication as prescribed: Yes. Toleration medication: Yes. Family/Significant other contact made: No, will contact:  CSW will contact if given permission  Patient understands diagnosis: Yes. Discussing patient identified problems/goals with staff: Yes. Medical problems stabilized or resolved: Yes. Denies suicidal/homicidal ideation: Yes. Issues/concerns per patient self-inventory: No. Other: None   New problem(s) identified: No, Describe:  None identified   New Short Term/Long Term Goal(s):  elimination of symptoms of psychosis, medication management for mood stabilization; elimination of SI thoughts; development of comprehensive mental wellness plan.   Patient Goals:  " I just want to, need to, do more self care for myself"   Discharge Plan or Barriers: CSW will assist with appropriate discharge planning   Reason for Continuation of Hospitalization: Depression Medication stabilization   Estimated Length of Stay: 1 to 7 days   Last 3 [place] Suicide Severity Risk Score: Flowsheet Row Admission (Current) from 03/31/2023 in [facility] ED to Hosp-Admission (Discharged) from 03/27/2023 in [facility] 6 [facility]TH  SURGICAL  C-SSRS RISK CATEGORY High Risk High Risk       Last [facility] 2/9 Scores:    02/06/2018    4:12 PM 01/24/2018   10:22 AM 01/13/2018    5:29 PM  Depression screen PHQ 2/9  Decreased Interest 0 0 0  Down, Depressed, Hopeless 0 0 0  PHQ - 2 Score 0 0 0    Scribe for Treatment Team: Elza Rafter, Theresia Majors 04/02/2023 11:38 AM

## 2023-04-02 NOTE — Plan of Care (Signed)

## 2023-04-02 NOTE — Progress Notes (Signed)
   04/02/23 1300  Psych Admission Type (Psych Patients Only)  Admission Status Involuntary  Psychosocial Assessment  Patient Complaints Anxiety  Eye Contact Brief  Facial Expression Flat  Affect Depressed  Speech Logical/coherent  Interaction Minimal  Motor Activity Slow  Appearance/Hygiene Unremarkable  Behavior Characteristics Cooperative;Calm  Thought Process  Coherency WDL  Content WDL  Delusions None reported or observed  Perception WDL  Hallucination None reported or observed  Judgment WDL  Confusion None  Danger to Self  Current suicidal ideation? Denies  Danger to Others  Danger to Others None reported or observed   Denies SI/HI/AVH. Tolerated all medications and meals. Attended group. Made several phone calls.

## 2023-04-03 DIAGNOSIS — F3130 Bipolar disorder, current episode depressed, mild or moderate severity, unspecified: Secondary | ICD-10-CM | POA: Diagnosis not present

## 2023-04-03 MED ORDER — DULOXETINE HCL 60 MG PO CPEP: 60.0000 mg | ORAL_CAPSULE | Freq: Every day | ORAL | 0 refills | Status: DC

## 2023-04-03 MED ORDER — TRAZODONE HCL 100 MG PO TABS: 100.0000 mg | ORAL_TABLET | Freq: Every evening | ORAL | 0 refills | Status: AC | PRN

## 2023-04-03 MED ORDER — AMLODIPINE BESYLATE 5 MG PO TABS: 5.0000 mg | ORAL_TABLET | Freq: Every day | ORAL | 0 refills | Status: AC

## 2023-04-03 MED ORDER — ADULT MULTIVITAMIN W/MINERALS CH: 1.0000 | ORAL_TABLET | Freq: Every day | ORAL | 0 refills | Status: AC

## 2023-04-03 MED ORDER — IRBESARTAN 300 MG PO TABS: 300.0000 mg | ORAL_TABLET | Freq: Every day | ORAL | 0 refills | Status: DC

## 2023-04-03 MED ORDER — BREXPIPRAZOLE 1 MG PO TABS: 1.0000 mg | ORAL_TABLET | Freq: Every day | ORAL | 0 refills | Status: DC

## 2023-04-03 MED ORDER — QUETIAPINE FUMARATE 50 MG PO TABS: 50.0000 mg | ORAL_TABLET | Freq: Every day | ORAL | 0 refills | Status: DC

## 2023-04-03 NOTE — Plan of Care (Signed)
Patient discharged with paperwork and prescriptions provided, along with a work note. Patient escorted to the exit by [facility] for her awaiting ride. Patient belonging checked and signed off by patient.

## 2023-04-03 NOTE — [facility] Suicide Risk Assessment (Signed)
[facility] INPATIENT:  Family/Significant Other Suicide Prevention Education  Suicide Prevention Education:  Education Completed; Dillon Aldis [phone], son,  (name of family member/significant other) has been identified by the patient as the family member/significant other with whom the patient will be residing, and identified as the person(s) who will aid the patient in the event of a mental health crisis (suicidal ideations/suicide attempt).  With written consent from the patient, the family member/significant other has been provided the following suicide prevention education, prior to the and/or following the discharge of the patient.  The suicide prevention education provided includes the following: Suicide risk factors Suicide prevention and interventions National Suicide Hotline telephone number [facility] assessment telephone number [facility] Emergency Assistance 911 [facility] and/or Residential Mobile Crisis Unit telephone number  Request made of family/significant other to: Remove weapons (e.g., guns, rifles, knives), all items previously/currently identified as safety concern.   Remove drugs/medications (over-the-counter, prescriptions, illicit drugs), all items previously/currently identified as a safety concern.  The family member/significant other verbalizes understanding of the suicide prevention education information provided.  The family member/significant other agrees to remove the items of safety concern listed above.  Elza Rafter 04/03/2023, 10:04 AM

## 2023-04-03 NOTE — Progress Notes (Signed)
  [facility] Adult Case Management Discharge Plan :  Will you be returning to the same living situation after discharge:  Yes,  pt will be returning home, but will be sharing time between her son and daughters home  At discharge, do you have transportation home?: Yes,  pt's friend Vernona Rieger will pick her up  Do you have the ability to pay for your medications: Yes,  CIG[employer] / CIGNA MANAGED  Release of information consent forms completed and in the chart;  Patient's signature needed at discharge.  Patient to Follow up at:  Follow-up Information     [facility], [facility]. Go on 04/04/2023.   Specialty: Behavioral Health Why: Your appointment is scheduled for 04/04/2023 at 4:00 PM. Contact information: [address] [place] [address] [phone]                 Next level of care provider has access to [facility] Link:no  Safety Planning and Suicide Prevention discussed: Yes,  Ronny Flurry, son      Has patient been referred to the Quitline?: Patient refused referral for treatment  Patient has been referred for addiction treatment: No known substance use disorder.  [address] LCSWA 04/03/2023, 10:04 AM

## 2023-04-03 NOTE — [facility] Suicide Risk Assessment (Signed)
[facility] Discharge Suicide Risk Assessment   Principal Problem: Bipolar I disorder, most recent episode depressed (HCC) Discharge Diagnoses: Principal Problem:   Bipolar I disorder, most recent episode depressed (HCC) Active Problems:   MDD (major depressive disorder), recurrent episode, severe (HCC)   Musculoskeletal: Strength & Muscle Tone: within normal limits Gait & Station: normal Patient leans: N/A   Psychiatric Specialty Exam:   Presentation  General Appearance:  Appropriate for Environment   Eye Contact: Fair   Speech: Clear and Coherent   Speech Volume: Normal     Mood and Affect  Mood: Good   Affect: Stable   Thought Process  Coherent; Goal Directed   Descriptions of Associations:Intact   Orientation:Full (Time, Place and Person)   Thought Content:Abstract Reasoning   History of Schizophrenia/Schizoaffective disorder:No   Hallucinations:Hallucinations: None   Ideas of Reference:None   Suicidal Thoughts:Suicidal Thoughts: Denies, no intentions or plans   Homicidal Thoughts:Homicidal Thoughts: No, no intentions or plans     Sensorium  Memory: Immediate Fair; Recent Fair   Judgment: Improved   Insight: Improved     Executive Functions  Concentration: Fair   Attention Span: Fair   Recall: [employer] of Knowledge: Fair   Language: Fair     Psychomotor Activity  Psychomotor Activity: Normal     Agricultural engineer; Physical Health; Desire for Improvement     Sleep  Fair     Physical Exam: Constitutional:      Appearance: Normal appearance.  HENT:     Head: Normocephalic and atraumatic.     Nose: No congestion.  Eyes:     Pupils: Pupils are equal, round, and reactive to light.  Cardiovascular:     Rate and Rhythm: Normal rate.  Pulmonary:     Effort: Pulmonary effort is normal.  Neurological:     General: No focal deficit present.     Mental Status: She is alert and oriented to person, place, and  time.      Review of Systems  Constitutional: Negative for chills and fever.  HENT:  Negative for congestion and hearing loss.   Eyes:  Negative for blurred vision and double vision.  Respiratory:  Negative for cough and shortness of breath.   Cardiovascular:  Negative for chest pain and palpitations.  Gastrointestinal:  Negative for heartburn, nausea and vomiting.  Neurological:  Negative for dizziness and speech change.   Blood pressure 135/77, pulse 76, temperature (!) 97.2 F (36.2 C), resp. rate 16, height 5\' 8"  (1.727 m), weight 103.8 kg, SpO2 98%. Body mass index is 34.8 kg/m.    Demographic Factors:  Caucasian and Living alone  Loss Factors: Loss of significant relationship  Historical Factors: Impulsivity  Risk Reduction Factors:   Sense of responsibility to family, Positive social support, Positive therapeutic relationship, and Positive coping skills or problem solving skills  Continued Clinical Symptoms:  Previous Psychiatric Diagnoses and Treatments  Cognitive Features That Contribute To Risk:  Polarized thinking    Suicide Risk:  Minimal: No identifiable suicidal ideation.     Follow-up Information     [facility]. Go on 04/04/2023.   Specialty: Behavioral Health Why: Your appointment is scheduled for 04/04/2023 at 4:00 PM. Contact information: [address] [place] [address] [phone]                 Plan Of Care/Follow-up recommendations:  Per Discharge Summary

## 2023-04-03 NOTE — Group Note (Signed)
Date:  04/03/2023 Time:  11:51 AM  Group Topic/Focus:  Managing Feelings:   The focus of this group is to identify what feelings patients have difficulty handling and develop a plan to handle them in a healthier way upon discharge.    Participation Level:  Active  Participation Quality:  Appropriate, Attentive, Sharing, and Supportive  Affect:  Appropriate  Cognitive:  Alert, Appropriate, and Oriented  Insight: Appropriate, Good, and Improving  Engagement in Group:  Engaged and Supportive  Modes of Intervention:  Activity, Socialization, and Support  Additional Comments:     Alexis Frock 04/03/2023, 11:51 AM

## 2023-04-03 NOTE — Discharge Summary (Signed)
Physician Discharge Summary Note  Patient:  Julia [name] is an 57 y.o., female MRN:  [id number] DOB:  09-20-1965 Patient phone:  [phone] (home)  Patient address:   [address] [place] [address],    Date of Admission:  03/31/2023 Date of Discharge: 04/03/2023  Reason for Admission:  Julia [name] is a 57 y.o. female admitted following intentional overdose. She carries the psychiatric diagnoses of anxiety, manic depression, and ADHD and has a past medical history of  HTN, diabetes, and arthritis.    Principal Problem: Bipolar I disorder, most recent episode depressed [facility]) Discharge Diagnoses: Principal Problem:   Bipolar I disorder, most recent episode depressed (HCC) Active Problems:   MDD (major depressive disorder), recurrent episode, severe (HCC)   Past Psychiatric History: Patient reports past history of bipolar disorder. She said that she was diagnosed with an "manic depression" 10 years ago. 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   Procedure: RIGHT TOTAL KNEE ARTHROPLASTY;  Surgeon: Valeria Batman, MD;  Location: [facility];  Service: Orthopedics;  Laterality: Right;   TOTAL KNEE ARTHROPLASTY Left 07/04/2020   Procedure: LEFT TOTAL KNEE ARTHROPLASTY;  Surgeon: Valeria Batman, MD;  Location: [facility];  Service: Orthopedics;  Laterality: Left;   Family History:  Family History  Problem Relation Age of Onset   Cancer Mother     Social History:  Social History   Substance and Sexual Activity  Alcohol Use Not Currently     Social History   Substance and Sexual Activity  Drug Use Never    Social History   Socioeconomic History   Marital  status: Married    Spouse name: Not on file   Number of children: Not on file   Years of education: Not on file   Highest education level: Not on file  Occupational History   Not on file  Tobacco Use   Smoking status: Former    Current packs/day: 0.00    Average packs/day: 1 pack/day for 25.0 years (25.0 ttl pk-yrs)    Types: Cigarettes    Start date: 12/03/1987    Quit date: 12/02/2012    Years since quitting: 10.3   Smokeless tobacco: Never  Vaping Use   Vaping status: Former   Quit date: 07/15/2022   Substances: Nicotine, Flavoring  Substance and Sexual Activity   Alcohol use: Not Currently   Drug use: Never   Sexual activity: Yes  Other Topics Concern   Not on file  Social History Narrative   Not on file   Social Determinants of Health   Financial Resource Strain: Patient Declined (08/01/2022)   Received from [facility], [facility]   Overall Financial Resource Strain (CARDIA)    Difficulty of Paying Living Expenses: Patient declined  Food Insecurity: No Food Insecurity (04/01/2023)   Hunger Vital Sign    Worried About Running Out of Food in the Last Year: Never true    Ran Out of Food in the Last Year: Never true  Transportation Needs: No Transportation Needs (04/01/2023)   PRAPARE - Administrator, Civil Service (Medical): No  Lack of Transportation (Non-Medical): No  Physical Activity: Sufficiently Active (07/16/2022)   Received from [facility], [facility]   Exercise Vital Sign    Days of Exercise per Week: 6 days    Minutes of Exercise per Session: 60 min  Stress: No Stress Concern Present (08/01/2022)   Received from [facility], [facility] of Occupational Health - Occupational Stress Questionnaire    Feeling of Stress : Only a little  Social Connections: Socially Integrated (07/16/2022)   Received from [facility], [facility]   Social Network    How would you rate your social network (family, work, friends)?:  Good participation with social networks  Recent Concern: Social Connections - Somewhat Isolated (04/19/2022)   Received from [employer]   Social Network    How would you rate your social network (family, work, friends)?: Restricted participation with some degree of social isolation    Hospital Course:  The patient was admitted to Inpatient psychiatric treatment for stabilization of depression and suicide thoughts.  Patient was placed on suicidal precautions. The patient was evaluated and treated by the multidisciplinary treatment team including physicians, nurses, social workers and therapists. All medications were presented to the patient and the Patient gave consent to all the medications that they were given, as well as was explained the risks, benefits, side effects and alternatives of all medication therapies. The patient was integrated into the general milieu on the ward and encouraged to attend to her ADLs and participate in all groups and activities. During hospital course the Patient attended coping skill groups, music therapy and activity therapy groups. Patient was counseled on cognitive techniques/skills by multiple staff members and given support care by the staff.  Patient's medication regimen was evaluated and titrated to therapeutic levels to better Patient's overall daily functioning. Specifically, the patient was started on Rexulti and duloxetine. She tolerated the medication well with no significant side effects.  Social worker got collateral information from patient's son.  Patient was supportive of patient.  Patient's son confirmed that patient will be staying with him or patient's daughter upon discharge.  During the hospitalization, the patient demonstrated a stabilization of mood with decreased racing thoughts, decreased impulsivity, improved sleep and decreased irritability. At the time of discharge, the patient denied any suicidal ideation/homicidal ideation and was not overtly  depressed, manic or psychotic. The Patient was interacting well in groups and on the unit with their peers. Patient was able to identify a safety plan to include speaking with family, contacting outpatient provider or calling 911 if hallucinations/delusions returned or worsened or thoughts of self-harm or suicide return. Patient was counselled on outpatient follow-up that was arranged prior to discharge.   Musculoskeletal: Strength & Muscle Tone: within normal limits Gait & Station: normal Patient leans: N/A   Psychiatric Specialty Exam:   Presentation  General Appearance:  Appropriate for Environment   Eye Contact: Fair   Speech: Clear and Coherent   Speech Volume: Normal     Mood and Affect  Mood: Good   Affect: Stable   Thought Process  Coherent; Goal Directed   Descriptions of Associations:Intact   Orientation:Full (Time, Place and Person)   Thought Content:Abstract Reasoning   History of Schizophrenia/Schizoaffective disorder:No   Hallucinations:Hallucinations: None   Ideas of Reference:None   Suicidal Thoughts:Suicidal Thoughts: Denies, no intentions or plans   Homicidal Thoughts:Homicidal Thoughts: No, no intentions or plans     Sensorium  Memory: Immediate Fair; Recent Fair   Judgment: Improved  Insight: Improved     Executive Functions  Concentration: Fair   Attention Span: Fair   Recall: [employer] of Knowledge: Fair   Language: Fair     Psychomotor Activity  Psychomotor Activity: Normal     Agricultural engineer; Physical Health; Desire for Improvement     Sleep  Fair     Physical Exam: Constitutional:      Appearance: Normal appearance.  HENT:     Head: Normocephalic and atraumatic.     Nose: No congestion.  Eyes:     Pupils: Pupils are equal, round, and reactive to light.  Cardiovascular:     Rate and Rhythm: Normal rate.  Pulmonary:     Effort: Pulmonary effort is normal.  Neurological:      General: No focal deficit present.     Mental Status: She is alert and oriented to person, place, and time.      Review of Systems  Constitutional: Negative for chills and fever.  HENT:  Negative for congestion and hearing loss.   Eyes:  Negative for blurred vision and double vision.  Respiratory:  Negative for cough and shortness of breath.   Cardiovascular:  Negative for chest pain and palpitations.  Gastrointestinal:  Negative for heartburn, nausea and vomiting.  Neurological:  Negative for dizziness and speech change.   Blood pressure 135/77, pulse 76, temperature (!) 97.2 F (36.2 C), resp. rate 16, height 5\' 8"  (1.727 m), weight 103.8 kg, SpO2 98%. Body mass index is 34.8 kg/m.   Social History   Tobacco Use  Smoking Status Former   Current packs/day: 0.00   Average packs/day: 1 pack/day for 25.0 years (25.0 ttl pk-yrs)   Types: Cigarettes   Start date: 12/03/1987   Quit date: 12/02/2012   Years since quitting: 10.3  Smokeless Tobacco Never   Tobacco Cessation:  N/A, patient does not currently use tobacco products   Blood Alcohol level:  Lab Results  Component Value Date   ETH 54 (H) 03/27/2023    Metabolic Disorder Labs:  Lab Results  Component Value Date   HGBA1C 6.0 (H) 03/28/2023   MPG 126 03/28/2023   No results found for: "PROLACTIN" Lab Results  Component Value Date   CHOL 150 03/29/2023   TRIG 88 03/29/2023   HDL 48 03/29/2023   CHOLHDL 3.1 03/29/2023   VLDL 18 03/29/2023   LDLCALC 84 03/29/2023    See Psychiatric Specialty Exam and Suicide Risk Assessment completed by Attending Physician prior to discharge.  Discharge destination:  Home  Is patient on multiple antipsychotic therapies at discharge:  No   Recommended Plan for Multiple Antipsychotic Therapies: NA   Allergies as of 04/03/2023       Reactions   Other Cough, Itching, Other (See Comments)   Congestion,cough,itchy eyes   Sulfamethoxazole-trimethoprim Hives, Other (See  Comments)   Burning sensation and redness   Penicillins Itching        Medication List     STOP taking these medications    clonazePAM 1 MG tablet Commonly known as: KLONOPIN   diclofenac Sodium 1 % Gel Commonly known as: Voltaren       TAKE these medications      Indication  albuterol 108 (90 Base) MCG/ACT inhaler Commonly known as: VENTOLIN HFA Inhale 2 puffs into the lungs every 6 (six) hours as needed for wheezing or shortness of breath.    amLODipine 5 MG tablet Commonly known as: NORVASC Take 1 tablet (5 mg total)  by mouth daily.    brexpiprazole 1 MG Tabs tablet Commonly known as: Rexulti Take 1 tablet (1 mg total) by mouth daily.    DULoxetine 60 MG capsule Commonly known as: CYMBALTA Take 1 capsule (60 mg total) by mouth daily. Start taking on: April 04, 2023 What changed: how much to take    irbesartan 300 MG tablet Commonly known as: AVAPRO Take 1 tablet (300 mg total) by mouth daily. Start taking on: April 04, 2023    Mounjaro 10 MG/0.5ML Pen Generic drug: tirzepatide Inject 10 mg into the skin once a week.    multivitamin with minerals Tabs tablet Take 1 tablet by mouth daily. Start taking on: April 04, 2023    QUEtiapine 50 MG tablet Commonly known as: SEROQUEL Take 1 tablet (50 mg total) by mouth at bedtime. What changed:  how much to take when to take this reasons to take this    Stiolto Respimat 2.5-2.5 MCG/ACT Aers Generic drug: Tiotropium Bromide-Olodaterol Inhale 2 puffs into the lungs daily.    traZODone 100 MG tablet Commonly known as: DESYREL Take 1 tablet (100 mg total) by mouth at bedtime as needed for sleep. What changed:  when to take this reasons to take this         Follow-up Information     [facility]. Go on 04/04/2023.   Specialty: Behavioral Health Why: Your appointment is scheduled for 04/04/2023 at 4:00 PM. Contact information: [address] [place] [address] [phone]                PATIENTS CONDITION AT DISCHARGE:  Stable  TOBACCO CESSATION SCREENING  Patient was screened and counselled on smoking cessation at time of discharge.  PRESCRIPTION ARE LOCATED:  On Chart  DISCHARGE INSTRUCTIONS:  1. Diet: Diabetic, and cardiac healthy  2. Activity: As tolerated  3. Take medications as prescribed and not to make any changes without first consulting with the outpatient provider.  4. Patient was advised to avoid any illicit drugs or alcohol due to negative impact on physical and mental health.  5. Patient should keep all follow up appointments.  TIME SPENT ON DISCHARGE: Over 35 minutes were spent on this patients discharge including a face to face encounter, patient counseling and preparation of discharge materials.  Signed: Lewanda Rife, MD

## 2023-04-03 NOTE — Plan of Care (Signed)
Problem: Health Behavior/Discharge Planning: Goal: Ability to manage health-related needs will improve Outcome: Progressing   Problem: Clinical Measurements: Goal: Ability to maintain clinical measurements within normal limits will improve Outcome: Progressing Goal: Will remain free from infection Outcome: Progressing Goal: Diagnostic test results will improve Outcome: Progressing Goal: Respiratory complications will improve Outcome: Progressing   Problem: Activity: Goal: Risk for activity intolerance will decrease Outcome: Progressing   Problem: Nutrition: Goal: Adequate nutrition will be maintained Outcome: Progressing   Problem: Coping: Goal: Level of anxiety will decrease Outcome: Progressing   Problem: Pain Managment: Goal: General experience of comfort will improve Outcome: Progressing

## 2023-04-03 NOTE — Progress Notes (Signed)
   04/03/23 0559  15 Minute Checks  Location Bedroom  Visual Appearance Calm  Behavior Sleeping  Sleep (Behavioral Health Patients Only)  Calculate sleep? (Click Yes once per 24 hr at 0600 safety check) Yes  Documented sleep last 24 hours 11.75

## 2024-01-26 ENCOUNTER — Ambulatory Visit: Payer: Self-pay | Admitting: Physician Assistant

## 2024-01-27 NOTE — Procedures (Signed)
    ERCP completed. Please see note in the procedures tab dated 01/27/2024.   Indication:  Choledocholithiasis 58 year old female who presented with abdominal pain to [facility] and was noted to have gallstones and CBD stones.  Transferred for ERCP, stone removal.  Postprocedure Diagnosis:  Choledocholithiasis s/p biliary sphincterotomy, balloon stone extraction   Recommendations: Avoid NSAIDs x 5 days to decrease risk of post sphincterotomy bleeding. Trend LFTs. Proceed to elective cholecystectomy.  Electronic Signature: Lennart JONELLE Sanders, MD 01/27/2024 12:59 PM

## 2024-01-29 NOTE — Discharge Summary (Signed)
 [facility]  [facility] Inpatient Discharge Summary  PCP: Joesph VEAR Cedar, PA-C Discharge Details   Admit date:         01/24/2024 Discharge date:        01/29/2024  Hospital Days:    4 days  Code Status:   Full Code Advanced Directives on file: No Directive        Discharge Diagnoses:  Cholecystitis Choledocholithiasis  Unresulted Labs     Order Current Status   Pathology Tissue Request In process       Follow-Up Appointments Suggested: [facility] [place]) [address] [place]  [address] [phone]  post-op visit  Joesph Johnnye Cedar, PA-C [address] [place] [address] [phone]     Follow-Up Appointments Already Scheduled: No future appointments.  Discharge Medications:   Current Discharge Medication List     START taking these medications      Details  methocarbamol  500 mg tablet Commonly known as: ROBAXIN   Take one tablet (500 mg dose) by mouth 3 (three) times a day for 4 days. Quantity: 12 tablet   ondansetron  4 mg tablet Commonly known as: ZOFRAN   Take one tablet (4 mg dose) by mouth every 8 (eight) hours as needed for Nausea for up to 7 days. Quantity: 20 tablet   oxyCODONE  HCl 5 mg immediate release tablet Commonly known as: ROXICODONE   Take one tablet (5 mg dose) by mouth every 6 (six) hours as needed for Pain for up to 4 days. Max Daily Amount: 20 mg Quantity: 16 tablet   polyethylene glycol 17 g packet Commonly known as: MIRALAX  Take 120 mLs (17 g dose) by mouth daily for 14 days. Quantity: 14 each       CONTINUE these medications which have NOT CHANGED      Details  albuterol  sulfate HFA 108 (90 Base) MCG/ACT inhaler Commonly known as: PROVENTIL ,VENTOLIN ,PROAIR   Inhale two puffs into the lungs every 6 (six) hours as needed for Wheezing. Quantity: 18 g   amLODIPine  besylate 5 mg  tablet Commonly known as: NORVASC   Take one tablet (5 mg dose) by mouth daily. Quantity: 90 tablet   amphetamine-dextroamphetamine 20 MG tablet Commonly known as: ADDERALL  Take one tablet by mouth 2 (two) times daily.   AUVELITY 45-105 MG tablet Generic drug: dextromethorphan-buPROPion ER  Take one tablet by mouth 2 (two) times daily.   clonazePAM 1 mg tablet Commonly known as: KLONOPIN  Take one tablet (1 mg dose) by mouth at bedtime as needed for Anxiety.   doxepin HCl 10 mg capsule Commonly known as: SINEQUAN  Take one capsule (10 mg dose) by mouth at bedtime.   MOUNJARO  12.5 MG/0.5ML Soaj injection Generic drug: tirzepatide   Inject 0.5 mLs (12.5 mg dose) into the skin once a week. Quantity: 6 mL   traZODone  50 mg tablet Commonly known as: DESYREL   Take one tablet (50 mg dose) by mouth at bedtime.      * You might also be taking other medications not listed above. If you have questions about any of your other medications, talk to the person who prescribed them or your Primary Care Provider.           Allergies: Allergies[1]  Consultations this Admission: IP CONSULT TO GENERAL SURGERY  Procedures/Imaging: Procedure(s) (LRB): CHOLECYSTECTOMY ROBOTIC (N/A)    MRI Abdomen MRCP WO W Contrast  Final Result  IMPRESSION:  1.  Similar biliary ductal dilation  with associated choledocholithiasis.  2.  Cholelithiasis, no cholecystitis.    Electronically Signed by: Allison Kobus on 01/26/2024 7:47 AM    CT Abdomen Pelvis W IV Contrast  Final Result  IMPRESSION: There is cholelithiasis seen. The common duct is also dilated, measuring at least 14 to 15 mm. This can be seen with a distal obstructive process or choledocholithiasis. This could be better evaluated with MRCP imaging    Hepatic steatosis and hepatomegaly        Electronically Signed by: Rock Croft, MD on 01/24/2024 11:20 PM    US  Gallbladder  Final Result  Impression: There is nonspecific  gallbladder wall thickening and cholelithiasis present. There is trace pericholecystic fluid present. The common duct is prominent. These findings can be seen with cholecystitis in the appropriate clinical setting.    Hepatic steatosis and hepatomegaly    Electronically Signed by: Rock Croft, MD on 01/24/2024 9:59 PM      Pertinent Labs:  Cardiac Labs: No results for input(s): CK, CKMB, CTNI, BNP in the last 168 hours. CBC: Recent Labs    Units 01/24/24 1941  WBC thou/mcL 6.6  HGB gm/dL 87.4  PLT thou/mcL 594*   BMP: Recent Labs    Units 01/29/24 0840 01/28/24 1457 01/24/24 1941  NA mmol/L 139 142 140  K mmol/L 4.0 3.9 4.0  CL mmol/L 104 106 102  CO2 mmol/L 25 25 24   BUN mg/dL 3* 3* 16  CREATININE mg/dL 9.35 9.43* 8.92*   Lipid Panel: No results for input(s): CHOL, TRIG, HDL, LDL in the last 168 hours. Liver Enzymes: Recent Labs    Units 01/29/24 0840 01/28/24 1457 01/26/24 0253 01/24/24 1941  AST U/L 124* 242* 54* 182*  ALT U/L 191* 254* 65* 113*  ALKPHOS U/L 199* 223* 110 166*  BILITOT mg/dL 0.6 1.3* 0.4 1.5*   Endocrine Panels: Recent Labs    Units 01/29/24 0840 01/28/24 1915 01/28/24 1527 01/28/24 1457 01/28/24 1110 01/28/24 0708 01/28/24 0430 01/27/24 2347 01/27/24 1914 01/27/24 1641 01/27/24 0723 01/27/24 0517  GLUCOSE mg/dL 897* 877* 77 88 87 85 85 106* 94 117* 87 90    Hospital Course   Physicians involved in care during this hospitalization Attending Provider: Norleen JULIANNA Arthur, DO Attending Provider: Lonni Norman Lukes, MD Admitting Provider: Lonni Norman Lukes, MD Consulting Physician: Lonni Norman Lukes, MD Consulting Physician: Lonni Bowers, MD Anesthesiologist: Elsie Nancyann Dull, MD Anesthesiologist: Venetia Durand, MD   Hospital Course:    Julia [name] is a 58 y.o. female who was admitted to the General Surgery service on 01/24/24 with cholecystitis and choledocholithiasis.  She was seen  by GI and went for an ERCP on 7/15 then followed by cholecystectomy on 7/16.  There were no intra-operative complications, and the patient did well overall from a surgical standpoint. On the day of discharge, the patient reports doing well: pain is adequately controlled, denies nausea, vomiting, fever or chills.  Tolerating PO intake and ambulating well.  She feels ready to go home today.  On physical exam, the patient's abdomen is soft, non-distended, appropriately tender to palpation, and incisions are clean, dry, and intact without evidence of infection.   Post-op instructions were discussed. We will plan for discharge today with follow-up in 1-2 weeks for post-op visit.    BP 115/64 (BP Location: Left Upper Arm, Patient Position: Lying)   Pulse 62   Temp 98.2 F (36.8 C) (Oral)   Resp 18   Ht 5' 7.99 (1.727 m)   Wt 181 lb (  82.1 kg)   SpO2 95%   BMI 27.53 kg/m     Post Hospital Care   Activity:   Weight Bearing Status:          Oxygen Orders for Discharge: O2 Device: None (Room air) SpO2: 95 % O2 Flow Rate (L/min): 0 L/min A RR: 16 A RR: 16  Diet: Diet and Nourishment Orders (From admission, onward)     Start       01/29/24 0827  Regular Diet  Diet effective now                     Lines/Drains/Airways: Patient Lines/Drains/Airways Status     Active LDAs     Name Placement date Placement time Site Days   Peripheral IV 20 G Anterior;Right Upper Arm 01/25/24  1540  Upper Arm  3            Therapy Recommendations:  PT:                  OT:            SLP:              Home Health Orders: DME Orders (From admission, onward)    None      Home Health Agency     None       I spent 20 minutes performing discharge services.   Electronically signed: Laymon FORBES Gentry, PA-C 01/29/2024 / 10:10 AM       [1] Allergies Allergen Reactions  . Penicillins Itching  . Seasonal Itching, Other and Cough     Congestion,cough,itchy eyes  . Bactrim  [Sulfamethoxazole -Trimethoprim ] Other    Burning sensation and redness  *Some images could not be shown.

## 2024-01-30 ENCOUNTER — Encounter: Payer: Self-pay | Admitting: Advanced Practice Midwife

## 2024-02-05 ENCOUNTER — Other Ambulatory Visit (INDEPENDENT_AMBULATORY_CARE_PROVIDER_SITE_OTHER): Payer: Self-pay

## 2024-02-05 ENCOUNTER — Encounter: Payer: Self-pay | Admitting: Physician Assistant

## 2024-02-05 ENCOUNTER — Ambulatory Visit (INDEPENDENT_AMBULATORY_CARE_PROVIDER_SITE_OTHER): Payer: Self-pay | Admitting: Physician Assistant

## 2024-02-05 DIAGNOSIS — G8929 Other chronic pain: Secondary | ICD-10-CM | POA: Diagnosis not present

## 2024-02-05 DIAGNOSIS — M25561 Pain in right knee: Secondary | ICD-10-CM

## 2024-02-05 NOTE — Progress Notes (Signed)
 Office Visit Note   Patient: Julia [name]           Date of Birth: [birth year]           MRN: [id number] Visit Date: 02/05/2024              Requested by: Emilio Joesph DEL, PA-C [address] [place],  [place] [address] PCP: Emilio Joesph DEL, PA-C   Assessment & Plan: Visit Diagnoses:  1. Acute pain of right knee     Plan: We will send her to formal therapy to work on quad strengthening particularly her VMO and the right leg.  She is shown quad strengthening exercises to begin on her own at home.  She will follow-up with Dr. Vernetta 4 weeks after starting therapy.  Questions were encouraged and answered at length.  Follow-Up Instructions: No follow-ups on file.   Orders:  Orders Placed This Encounter  Procedures   XR Knee 1-2 Views Right   No orders of the defined types were placed in this encounter.     Procedures: No procedures performed   Clinical Data: No additional findings.   Subjective: Chief Complaint  Patient presents with   Right Knee - Pain    HPI Mrs. [age] year old female comes in today with her right knee bothering her for the last 3 months.  She underwent a right total knee arthroplasty by Dr. Anderson in June 2021 and then a left total knee arthroplasty December 2021.  She states the left knee is doing well.  The right knee though gives way with stairs.  She has a clicking sensation in the knee.  She has had no known injury.  She has been taking ibuprofen for the pain. Review of Systems See HPI otherwise negative  Objective: Vital Signs: There were no vitals taken for this visit.  Physical Exam Constitutional:      Appearance: She is not ill-appearing or diaphoretic.  Pulmonary:     Effort: Pulmonary effort is normal.  Neurological:     Mental Status: She is alert.  Psychiatric:        Mood and Affect: Mood normal.     Ortho Exam Bilateral knees: Good range of motion of both knees.  Patellofemoral clicking with range  of motion right knee only.  No instability with valgus varus stressing of either knee.  Anterior drawer right knee is negative.  There is no abnormal warmth erythema or effusion of either knee.  Specialty Comments:  No specialty comments available.  Imaging: XR Knee 1-2 Views Right Result Date: 02/05/2024 AP lateral views right knee: Right knee is well located.  Right total knee arthroplasty components well-seated.  No acute fractures acute findings.    PMFS History: Patient Active Problem List   Diagnosis Date Noted   Bipolar I disorder, most recent episode depressed (HCC) 04/01/2023   MDD (major depressive disorder), recurrent episode, severe (HCC) 03/31/2023   Intentional overdose (HCC) 03/28/2023   MDD (major depressive disorder), recurrent (HCC) 03/28/2023   Acute hypoxic respiratory failure (HCC) 03/28/2023   Hypokalemia 03/28/2023   Adjustment disorder with mixed anxiety and depressed mood 03/28/2023   Alcohol use disorder, severe (HCC) 03/28/2023   Tobacco use disorder 03/28/2023   Generalized anxiety disorder 03/28/2023   Trigger finger of right hand 06/25/2022   Status post total knee replacement, left 07/19/2020   Total knee replacement status, right 12/14/2019   Class 2 obesity due to excess calories without serious comorbidity with body  mass index (BMI) of 39.0 to 39.9 in adult 11/05/2019   Unilateral primary osteoarthritis, right knee 11/05/2019   Unilateral primary osteoarthritis, left knee 11/05/2019   Bilateral primary osteoarthritis of knee 01/25/2019   Past Medical History:  Diagnosis Date   Anxiety    Arthritis    knees, hands   Depression    Hypertension     Family History  Problem Relation Age of Onset   Cancer Mother     Past Surgical History:  Procedure Laterality Date   ABDOMINAL HYSTERECTOMY     KNEE ARTHROSCOPY     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   Procedure: RIGHT TOTAL KNEE ARTHROPLASTY;  Surgeon: Anderson Maude ORN, MD;  Location:  [facility];  Service: Orthopedics;  Laterality: Right;   TOTAL KNEE ARTHROPLASTY Left 07/04/2020   Procedure: LEFT TOTAL KNEE ARTHROPLASTY;  Surgeon: Anderson Maude ORN, MD;  Location: [facility];  Service: Orthopedics;  Laterality: Left;   Social History   Occupational History   Not on file  Tobacco Use   Smoking status: Former    Current packs/day: 0.00    Average packs/day: 1 pack/day for 25.0 years (25.0 ttl pk-yrs)    Types: Cigarettes    Start date: 12/03/1987    Quit date: 12/02/2012    Years since quitting: 11.1   Smokeless tobacco: Never  Vaping Use   Vaping status: Former   Quit date: 07/15/2022   Substances: Nicotine, Flavoring  Substance and Sexual Activity   Alcohol use: Not Currently   Drug use: Never   Sexual activity: Yes

## 2024-02-06 ENCOUNTER — Other Ambulatory Visit: Payer: Self-pay | Admitting: Radiology

## 2024-02-06 DIAGNOSIS — M25561 Pain in right knee: Secondary | ICD-10-CM

## 2024-03-08 ENCOUNTER — Ambulatory Visit: Admitting: Orthopaedic Surgery

## 2024-04-12 ENCOUNTER — Ambulatory Visit: Admitting: Orthopaedic Surgery

## 2024-05-10 ENCOUNTER — Ambulatory Visit: Admitting: Orthopaedic Surgery

## 2024-05-17 ENCOUNTER — Encounter: Payer: Self-pay | Admitting: Radiology
